# Patient Record
Sex: Female | Born: 1935 | Race: White | Hispanic: No | Marital: Single | Smoking: Never smoker
Health system: Southern US, Community
[De-identification: ages and names within clinical notes are randomized; demographics above are authoritative.]

## PROBLEM LIST (undated history)

## (undated) DIAGNOSIS — E7800 Pure hypercholesterolemia, unspecified: Secondary | ICD-10-CM

## (undated) DIAGNOSIS — D751 Secondary polycythemia: Secondary | ICD-10-CM

## (undated) DIAGNOSIS — E039 Hypothyroidism, unspecified: Secondary | ICD-10-CM

## (undated) DIAGNOSIS — I10 Essential (primary) hypertension: Secondary | ICD-10-CM

## (undated) DIAGNOSIS — J449 Chronic obstructive pulmonary disease, unspecified: Secondary | ICD-10-CM

## (undated) HISTORY — PX: BACK SURGERY: SHX140

## (undated) HISTORY — PX: THYROIDECTOMY: SHX17

---

## 2021-07-04 ENCOUNTER — Other Ambulatory Visit: Payer: Self-pay

## 2021-07-04 ENCOUNTER — Encounter (HOSPITAL_BASED_OUTPATIENT_CLINIC_OR_DEPARTMENT_OTHER): Payer: Self-pay

## 2021-07-04 ENCOUNTER — Emergency Department (HOSPITAL_BASED_OUTPATIENT_CLINIC_OR_DEPARTMENT_OTHER): Payer: Medicare Other

## 2021-07-04 ENCOUNTER — Observation Stay (HOSPITAL_BASED_OUTPATIENT_CLINIC_OR_DEPARTMENT_OTHER)
Admission: EM | Admit: 2021-07-04 | Discharge: 2021-07-05 | Disposition: A | Discharge status: Home / Self Care | Payer: Medicare Other | Attending: Internal Medicine | Admitting: Internal Medicine

## 2021-07-04 DIAGNOSIS — I129 Hypertensive chronic kidney disease with stage 1 through stage 4 chronic kidney disease, or unspecified chronic kidney disease: Secondary | ICD-10-CM | POA: Diagnosis not present

## 2021-07-04 DIAGNOSIS — J449 Chronic obstructive pulmonary disease, unspecified: Secondary | ICD-10-CM | POA: Diagnosis not present

## 2021-07-04 DIAGNOSIS — N183 Chronic kidney disease, stage 3 unspecified: Secondary | ICD-10-CM | POA: Insufficient documentation

## 2021-07-04 DIAGNOSIS — E039 Hypothyroidism, unspecified: Secondary | ICD-10-CM | POA: Diagnosis not present

## 2021-07-04 DIAGNOSIS — Z20822 Contact with and (suspected) exposure to covid-19: Secondary | ICD-10-CM | POA: Insufficient documentation

## 2021-07-04 DIAGNOSIS — R55 Syncope and collapse: Secondary | ICD-10-CM | POA: Diagnosis present

## 2021-07-04 DIAGNOSIS — R0600 Dyspnea, unspecified: Secondary | ICD-10-CM

## 2021-07-04 DIAGNOSIS — E890 Postprocedural hypothyroidism: Secondary | ICD-10-CM | POA: Diagnosis present

## 2021-07-04 DIAGNOSIS — I10 Essential (primary) hypertension: Secondary | ICD-10-CM

## 2021-07-04 HISTORY — DX: Hypothyroidism, unspecified: E03.9

## 2021-07-04 HISTORY — DX: Chronic obstructive pulmonary disease, unspecified: J44.9

## 2021-07-04 HISTORY — DX: Secondary polycythemia: D75.1

## 2021-07-04 HISTORY — DX: Essential (primary) hypertension: I10

## 2021-07-04 HISTORY — DX: Pure hypercholesterolemia, unspecified: E78.00

## 2021-07-04 LAB — CBC WITH DIFFERENTIAL/PLATELET
Abs Immature Granulocytes: 0.05 10*3/uL (ref 0.00–0.07)
Basophils Absolute: 0 10*3/uL (ref 0.0–0.1)
Basophils Relative: 0 %
Eosinophils Absolute: 0 10*3/uL (ref 0.0–0.5)
Eosinophils Relative: 0 %
HCT: 48.9 % — ABNORMAL HIGH (ref 36.0–46.0)
Hemoglobin: 16.9 g/dL — ABNORMAL HIGH (ref 12.0–15.0)
Immature Granulocytes: 1 %
Lymphocytes Relative: 5 %
Lymphs Abs: 0.5 10*3/uL — ABNORMAL LOW (ref 0.7–4.0)
MCH: 31.5 pg (ref 26.0–34.0)
MCHC: 34.6 g/dL (ref 30.0–36.0)
MCV: 91.2 fL (ref 80.0–100.0)
Monocytes Absolute: 0.9 10*3/uL (ref 0.1–1.0)
Monocytes Relative: 9 %
Neutro Abs: 8.4 10*3/uL — ABNORMAL HIGH (ref 1.7–7.7)
Neutrophils Relative %: 85 %
Platelets: 220 10*3/uL (ref 150–400)
RBC: 5.36 MIL/uL — ABNORMAL HIGH (ref 3.87–5.11)
RDW: 13.3 % (ref 11.5–15.5)
WBC: 9.9 10*3/uL (ref 4.0–10.5)
nRBC: 0 % (ref 0.0–0.2)

## 2021-07-04 LAB — BASIC METABOLIC PANEL
Anion gap: 11 (ref 5–15)
BUN: 20 mg/dL (ref 8–23)
CO2: 26 mmol/L (ref 22–32)
Calcium: 9.7 mg/dL (ref 8.9–10.3)
Chloride: 93 mmol/L — ABNORMAL LOW (ref 98–111)
Creatinine, Ser: 1.08 mg/dL — ABNORMAL HIGH (ref 0.44–1.00)
GFR, Estimated: 49 mL/min — ABNORMAL LOW (ref >60)
Glucose, Bld: 104 mg/dL — ABNORMAL HIGH (ref 70–99)
Potassium: 4.2 mmol/L (ref 3.5–5.1)
Sodium: 130 mmol/L — ABNORMAL LOW (ref 135–145)

## 2021-07-04 LAB — D-DIMER, QUANTITATIVE: D-Dimer, Quant: 0.8 ug/mL-FEU — ABNORMAL HIGH (ref 0.00–0.50)

## 2021-07-04 LAB — TROPONIN I (HIGH SENSITIVITY)
Troponin I (High Sensitivity): 3 ng/L (ref <18)
Troponin I (High Sensitivity): 4 ng/L (ref <18)

## 2021-07-04 LAB — RESP PANEL BY RT-PCR (FLU A&B, COVID) ARPGX2
Influenza A by PCR: NEGATIVE
Influenza B by PCR: NEGATIVE
SARS Coronavirus 2 by RT PCR: NEGATIVE

## 2021-07-04 MED ORDER — ACETAMINOPHEN 325 MG PO TABS: 650.0000 mg | ORAL_TABLET | Freq: Four times a day (QID) | ORAL | Status: DC | PRN

## 2021-07-04 MED ORDER — LISINOPRIL 10 MG PO TABS
10.0000 mg | ORAL_TABLET | Freq: Two times a day (BID) | ORAL | Status: DC
  Administered 2021-07-04 – 2021-07-05 (×2): 10 mg via ORAL
  Filled 2021-07-04 (×2): qty 1

## 2021-07-04 MED ORDER — HYDRALAZINE HCL 25 MG PO TABS: 25.0000 mg | ORAL_TABLET | Freq: Four times a day (QID) | ORAL | Status: DC | PRN

## 2021-07-04 MED ORDER — ACETAMINOPHEN 650 MG RE SUPP: 650.0000 mg | Freq: Four times a day (QID) | RECTAL | Status: DC | PRN

## 2021-07-04 MED ORDER — SODIUM CHLORIDE 0.9 % IV BOLUS
500.0000 mL | Freq: Once | INTRAVENOUS | Status: AC
  Administered 2021-07-04: 500 mL via INTRAVENOUS

## 2021-07-04 MED ORDER — ENOXAPARIN SODIUM 40 MG/0.4ML IJ SOSY
40.0000 mg | PREFILLED_SYRINGE | Freq: Every day | INTRAMUSCULAR | Status: DC
  Administered 2021-07-05: 40 mg via SUBCUTANEOUS
  Filled 2021-07-04: qty 0.4

## 2021-07-04 MED ORDER — LEVOTHYROXINE SODIUM 50 MCG PO TABS
50.0000 ug | ORAL_TABLET | Freq: Every day | ORAL | Status: DC
  Administered 2021-07-05: 50 ug via ORAL
  Filled 2021-07-04: qty 1

## 2021-07-04 NOTE — H&P (Addendum)
History and Physical    Marilyn Hobbs [id number] DOB: 09-23-1929 DOA: 07/04/2021  PCP: Pcp, No  Patient coming from: Home.  Chief Complaint: Loss of consciousness.  HPI: Marilyn Hobbs is a 87 y.o. female with history of hypertension, COPD, hyperlipidemia, postoperative hypothyroidism, polycythemia who has just recently moved from [place] about a month ago was brought to the ER because patient had a brief episode of syncope.  Patient states she has been having off-and-on dizzy spells today she was in the house with her grandson when she suddenly had a spell of loss of consciousness witnessed by patient's grandson.  Patient's grandson was able to hold her and place her on the floor.  Patient's syncopal episode lasted for few seconds.  Patient regained consciousness and has not had any focal deficits chest pain or incontinence of urine.  Patient states she has been having labile hypertension and takes lisinopril based on how her systolic blood pressure is.  Her dose of lisinopril varies from '40mg'$  to 10 mg twice daily based on her systolic blood pressure readings in the morning.  Patient also takes hydrochlorothiazide.  This morning patient took 40 mg of lisinopril and 25 mg of hydrochlorothiazide and shortly later patient developed the symptoms.  Over the last few days patient has been having shortness of breath.  ED Course: In the ER chest x-ray was unremarkable EKG shows normal sinus rhythm with QTC of 434 ms.  Labs show creatinine of 1.08 sodium 130 hemoglobin of 16 hematocrit of 48 high sensitive troponins were negative.  COVID test was negative.  Patient CT head was unremarkable.  Patient admitted for syncopal work-up.  Review of Systems: As per HPI, rest all negative.   Past Medical History:  Diagnosis Date   COPD (chronic obstructive pulmonary disease) (HCC)    High cholesterol    Hypertension    Hypothyroidism    Polycythemia     Past Surgical History:  Procedure Laterality Date    BACK SURGERY       reports that she has never smoked. She has never used smokeless tobacco. She reports current alcohol use of about 1.0 standard drink per week. No history on file for drug use.  No Known Allergies  Family History  Problem Relation Age of Onset   Breast cancer Mother     Prior to Admission medications   Not on File    Physical Exam: Constitutional: Moderately built and nourished. Vitals:   07/04/21 1645 07/04/21 1812 07/04/21 1951 07/04/21 2117  BP: (!) 169/91 (!) 172/77 (!) 179/73 (!) 147/81  Pulse: 70 68 75   Resp: '19 20 16   '$ Temp:   98.2 F (36.8 C) 98 F (36.7 C)  TempSrc:   Oral Oral  SpO2: 99% 100% 100%   Weight:      Height:       Eyes: Anicteric no pallor. ENMT: No discharge from the ears eyes nose and mouth. Neck: No mass felt.  No neck rigidity. Respiratory: No rhonchi or crepitations. Cardiovascular: S1-S2 heard. Abdomen: Soft nontender bowel sound present. Musculoskeletal: No edema. Skin: No rash. Neurologic: Alert awake oriented to time place and person.  Moves all extremities. Psychiatric: Appears normal.  Normal affect.   Labs on Admission: I have personally reviewed following labs and imaging studies  CBC: Recent Labs  Lab 07/04/21 1310  WBC 9.9  NEUTROABS 8.4*  HGB 16.9*  HCT 48.9*  MCV 91.2  PLT [id number]   Basic Metabolic Panel: Recent Labs  Lab 07/04/21  1310  NA 130*  K 4.2  CL 93*  CO2 26  GLUCOSE 104*  BUN 20  CREATININE 1.08*  CALCIUM 9.7   GFR: Estimated Creatinine Clearance: 33.8 mL/min (A) (by C-G formula based on SCr of 1.08 mg/dL (H)). Liver Function Tests: No results for input(s): AST, ALT, ALKPHOS, BILITOT, PROT, ALBUMIN in the last 168 hours. No results for input(s): LIPASE, AMYLASE in the last 168 hours. No results for input(s): AMMONIA in the last 168 hours. Coagulation Profile: No results for input(s): INR, PROTIME in the last 168 hours. Cardiac Enzymes: No results for input(s): CKTOTAL, CKMB,  CKMBINDEX, TROPONINI in the last 168 hours. BNP (last 3 results) No results for input(s): PROBNP in the last 8760 hours. HbA1C: No results for input(s): HGBA1C in the last 72 hours. CBG: No results for input(s): GLUCAP in the last 168 hours. Lipid Profile: No results for input(s): CHOL, HDL, LDLCALC, TRIG, CHOLHDL, LDLDIRECT in the last 72 hours. Thyroid Function Tests: No results for input(s): TSH, T4TOTAL, FREET4, T3FREE, THYROIDAB in the last 72 hours. Anemia Panel: No results for input(s): VITAMINB12, FOLATE, FERRITIN, TIBC, IRON, RETICCTPCT in the last 72 hours. Urine analysis: No results found for: COLORURINE, APPEARANCEUR, LABSPEC, PHURINE, GLUCOSEU, HGBUR, BILIRUBINUR, KETONESUR, PROTEINUR, UROBILINOGEN, NITRITE, LEUKOCYTESUR Sepsis Labs: '@LABRCNTIP'$ (procalcitonin:4,lacticidven:4) ) Recent Results (from the past 240 hour(s))  Resp Panel by RT-PCR (Flu A&B, Covid) Nasopharyngeal Swab     Status: None   Collection Time: 07/04/21  2:41 PM   Specimen: Nasopharyngeal Swab; Nasopharyngeal(NP) swabs in vial transport medium  Result Value Ref Range Status   SARS Coronavirus 2 by RT PCR NEGATIVE NEGATIVE Final    Comment: (NOTE) SARS-CoV-2 target nucleic acids are NOT DETECTED.  The SARS-CoV-2 RNA is generally detectable in upper respiratory specimens during the acute phase of infection. The lowest concentration of SARS-CoV-2 viral copies this assay can detect is 138 copies/mL. A negative result does not preclude SARS-Cov-2 infection and should not be used as the sole basis for treatment or other patient management decisions. A negative result may occur with  improper specimen collection/handling, submission of specimen other than nasopharyngeal swab, presence of viral mutation(s) within the areas targeted by this assay, and inadequate number of viral copies(<138 copies/mL). A negative result must be combined with clinical observations, patient history, and  epidemiological information. The expected result is Negative.  Fact Sheet for Patients:  EntrepreneurPulse.com.au  Fact Sheet for Healthcare Providers:  IncredibleEmployment.be  This test is no t yet approved or cleared by the [place] FDA and  has been authorized for detection and/or diagnosis of SARS-CoV-2 by FDA under an Emergency Use Authorization (EUA). This EUA will remain  in effect (meaning this test can be used) for the duration of the COVID-19 declaration under Section 564(b)(1) of the Act, 21 U.S.C.section 360bbb-3(b)(1), unless the authorization is terminated  or revoked sooner.       Influenza A by PCR NEGATIVE NEGATIVE Final   Influenza B by PCR NEGATIVE NEGATIVE Final    Comment: (NOTE) The Xpert Xpress SARS-CoV-2/FLU/RSV plus assay is intended as an aid in the diagnosis of influenza from Nasopharyngeal swab specimens and should not be used as a sole basis for treatment. Nasal washings and aspirates are unacceptable for Xpert Xpress SARS-CoV-2/FLU/RSV testing.  Fact Sheet for Patients: EntrepreneurPulse.com.au  Fact Sheet for Healthcare Providers: IncredibleEmployment.be  This test is not yet approved or cleared by the [place] FDA and has been authorized for detection and/or diagnosis of SARS-CoV-2 by FDA under an Emergency  Use Authorization (EUA). This EUA will remain in effect (meaning this test can be used) for the duration of the COVID-19 declaration under Section 564(b)(1) of the Act, 21 U.S.C. section 360bbb-3(b)(1), unless the authorization is terminated or revoked.  Performed at [facility], [address] [place], [place] [address]      Radiological Exams on Admission: CT HEAD WO CONTRAST (5MM)  Result Date: 07/04/2021 CLINICAL DATA:  Syncope EXAM: CT HEAD WITHOUT CONTRAST TECHNIQUE: Contiguous axial images were obtained from the base of the skull  through the vertex without intravenous contrast. COMPARISON:  None. FINDINGS: Brain: No evidence of acute infarction, hemorrhage, hydrocephalus, extra-axial collection or mass lesion/mass effect. Periventricular white matter changes, likely the sequela of chronic small vessel ischemic disease. Vascular: No hyperdense vessel or unexpected calcification. Skull: Evaluation is somewhat limited by motion affecting primarily the face and skull base. Within this limitation, negative for fracture or focal lesion. Sinuses/Orbits: Evaluation is somewhat limited by motion. Within limitation, no acute finding. Other: None. IMPRESSION: No acute intracranial process. Electronically Signed   By: Merilyn Baba M.D.   On: 07/04/2021 15:44   DG Chest Port 1 View  Result Date: 07/04/2021 CLINICAL DATA:  Dyspnea. EXAM: PORTABLE CHEST 1 VIEW COMPARISON:  None. FINDINGS: The heart size and mediastinal contours are within normal limits. Both lungs are clear. The visualized skeletal structures are unremarkable. IMPRESSION: No active disease. Electronically Signed   By: Marijo Conception M.D.   On: 07/04/2021 14:09    EKG: Independently reviewed.  Normal sinus rhythm QTc 434 ms.  Assessment/Plan Principal Problem:   Syncope and collapse Active Problems:   Essential hypertension   CKD (chronic kidney disease), stage III (HCC)   Post-operative hypothyroidism   Syncope    Syncope -we will closely monitor in telemetry check orthostatics.  Since patient has been having some shortness of breath we will check D-dimer and also 2D echo. Hypertension -patient takes various dose of lisinopril.  For now I have placed patient on 10 mg twice daily which is the lowest dose patient usually takes.  As needed IV hydralazine for systolic more than 0000000.  Hold hydrochlorothiazide for now until we get orthostatics and also patient has hyponatremia.  Closely monitor blood pressure trends. Hyponatremia with sodium of 130.  Follow metabolic panel  hold hydrochlorothiazide for now.  We do not have any old labs to compare. History of postoperative hypothyroidism on Synthroid. History of COPD takes Spiriva. History of polycythemia vera gets phlebotomy based on hematocrit. Chronically disease stage III we do not have old labs to compare.  Follow metabolic panel.  I was able to verify some of the home medications.  And have ordered accordingly.  Some of the other medicines which patient takes has to be reconsulted.   Will need to get her records from [place].   DVT prophylaxis: Lovenox. Code Status: Full code. Family Communication: Patient's daughter. Disposition Plan: Home. Consults called: None. Admission status: Observation.   Rise Patience MD [facility] Pager [phone].  If 7PM-7AM, please contact night-coverage [email or web address] Password [facility]  07/04/2021, 11:53 PM

## 2021-07-04 NOTE — ED Triage Notes (Signed)
Pt states she has been feeling light headed and SOB-states she passed out ~1030am-to triage via w/c

## 2021-07-04 NOTE — Plan of Care (Signed)
  Problem: Education: Goal: Knowledge of General Education information will improve Description: Including pain rating scale, medication(s)/side effects and non-pharmacologic comfort measures Outcome: Progressing   Problem: Health Behavior/Discharge Planning: Goal: Ability to manage health-related needs will improve Outcome: Progressing   Problem: Clinical Measurements: Goal: Ability to maintain clinical measurements within normal limits will improve Outcome: Progressing   Problem: Clinical Measurements: Goal: Will remain free from infection Outcome: Progressing   Problem: Clinical Measurements: Goal: Diagnostic test results will improve Outcome: Progressing   Problem: Clinical Measurements: Goal: Respiratory complications will improve Outcome: Progressing   Problem: Clinical Measurements: Goal: Cardiovascular complication will be avoided Outcome: Progressing   Problem: Safety: Goal: Ability to remain free from injury will improve Outcome: Progressing   Problem: Skin Integrity: Goal: Risk for impaired skin integrity will decrease Outcome: Progressing

## 2021-07-04 NOTE — ED Provider Notes (Signed)
[facility] EMERGENCY DEPARTMENT Provider Note   CSN: [id number] Arrival date & time: 07/04/21  1153     History Chief Complaint  Patient presents with   Loss of Consciousness    Marilyn Hobbs is a 87 y.o. female.  HPI 87 year old female presents with syncope.  History is from patient and grandson.  Patient has been feeling lightheaded since taking her meds around 10:30 AM.  This includes lisinopril and hydralazine.  She also has been short of breath.  She was then at the sink and was otherwise doing well when her son noticed she started to get wobbly and started to fall backwards.  He caught her and lowered her to the ground.  She was confused but not unresponsive during this time.  This lasted about 2 minutes and then now she is back to normal and does not remember what happened.  She still feels a little lightheaded and short of breath.  Further talking to her, she indicates that she is lightheaded from time to time as well as short of breath.  She states her chest is a little tight but does not really hurt.  Otherwise she has not been feeling sick recently including no fevers, cough, diarrhea.  No recent medication changes.  Past Medical History:  Diagnosis Date   High cholesterol    Hypertension     There are no problems to display for this patient.   History reviewed. No pertinent surgical history.   OB History   No obstetric history on file.     No family history on file.  Social History   Tobacco Use   Smoking status: Never   Smokeless tobacco: Never  Substance Use Topics   Alcohol use: Yes    Comment: weekly    Home Medications Prior to Admission medications   Not on File    Allergies    Patient has no allergy information on record.  Review of Systems   Review of Systems  Constitutional:  Negative for fever.  Respiratory:  Positive for chest tightness and shortness of breath. Negative for cough.   Cardiovascular:  Negative for chest pain and  leg swelling.  Gastrointestinal:  Negative for abdominal pain, diarrhea and vomiting.  Neurological:  Positive for syncope and light-headedness. Negative for headaches.  All other systems reviewed and are negative.  Physical Exam Updated Vital Signs There were no vitals taken for this visit.  Physical Exam Vitals and nursing note reviewed.  Constitutional:      Appearance: She is well-developed.  HENT:     Head: Normocephalic and atraumatic.     Right Ear: External ear normal.     Left Ear: External ear normal.     Nose: Nose normal.  Eyes:     General:        Right eye: No discharge.        Left eye: No discharge.     Extraocular Movements: Extraocular movements intact.     Pupils: Pupils are equal, round, and reactive to light.  Cardiovascular:     Rate and Rhythm: Normal rate and regular rhythm.     Heart sounds: Normal heart sounds.  Pulmonary:     Effort: Pulmonary effort is normal.     Breath sounds: Normal breath sounds.  Abdominal:     Palpations: Abdomen is soft.     Tenderness: There is no abdominal tenderness.  Skin:    General: Skin is warm and dry.  Neurological:  Mental Status: She is alert.     Comments: CN 3-12 grossly intact. 5/5 strength in all 4 extremities. Grossly normal sensation. Normal finger to nose.   Psychiatric:        Mood and Affect: Mood is not anxious.    ED Results / Procedures / Treatments   Labs (all labs ordered are listed, but only abnormal results are displayed) Labs Reviewed - No data to display  EKG EKG Interpretation  Date/Time:  Wednesday July 04 2021 12:13:26 EDT Ventricular Rate:  89 PR Interval:  144 QRS Duration: 88 QT Interval:  356 QTC Calculation: 434 R Axis:   87 Text Interpretation: Sinus rhythm Atrial premature complex Consider right atrial enlargement Borderline right axis deviation No old tracing to compare Confirmed by Sherwood Gambler [phone]) on 07/04/2021 12:36:49 PM  Radiology No results  found.  Procedures Procedures   Medications Ordered in ED Medications  sodium chloride 0.9 % bolus 500 mL (has no administration in time range)    ED Course  I have reviewed the triage vital signs and the nursing notes.  Pertinent labs & imaging results that were available during my care of the patient were reviewed by me and considered in my medical decision making (see chart for details).    MDM Rules/Calculators/A&P                           Initial workup is unremarkable. Likely this was syncope. When discussing more with grandson, he denies seizure like activity but does state she was stiff initially. Will get head CT. Either way she should get admitted for higher risk syncope vs seizure. Care to Dr. Sherry Ruffing.  Final Clinical Impression(s) / ED Diagnoses Final diagnoses:  None    Rx / DC Orders ED Discharge Orders     None        Sherwood Gambler, MD 07/04/21 1554

## 2021-07-04 NOTE — Progress Notes (Signed)
Received a phone call from Facility: [facility]  Requesting MD: dr. Sherry Ruffing Patient with h/o HTN, HLD presenting with syncope and collapse. Son caught her. Has had some near syncopal episodes in the past. No shaking. Bp has been labile.   Vitals stable, on room air. Sodium: 130, hgb: 16.9, d-dimer was .21 which was age adjusted. No CTA. Given a little fluids.  CXR and CTH wnl.   Plan of care: syncope w/u, echo, telemetry and BP adjustments.   The patient will be accepted for admission to telemetry at [facility] when bed is available.  Requested the transferring physician give pm dose or half of her pm dose of hydralazine.    Nursing staff, Please call the [facility] number at the top of Amion at the time of the patient's arrival so that the patient can be paged to the admitting physician.   Casimer Bilis M.D. [facility]

## 2021-07-04 NOTE — ED Notes (Signed)
Per PT grandson Merrilee Seashore), requested that we call him and convey where PT will be transferred to. I called and LMM that PT will be transferred to [facility].

## 2021-07-04 NOTE — ED Notes (Signed)
Attempted to call report to [place]. Left message to call back.

## 2021-07-04 NOTE — ED Provider Notes (Signed)
3:21 PM Care assumed from Dr. Regenia Skeeter.  At time of transfer of care, patient is awaiting for results of CT head prior to admission for concerning syncope in a 87 year old female with a reported history of unknown cardiac disease, hypertension, and hypercholesterolemia.  Patient had a D-dimer that was elevated however per age adjustment, it was normal.  Based on discussion with previous team, we will not pursue CT PE study at this time.  Patient is awaiting CT head to rule out intracranial injury from the syncope and fall to the ground.  Patient's labs revealed some hyponatremia and likely hemoconcentration.  She was given Fluids initially.  Anticipate admission after imaging is completed.  5:16 PM Patient CT head shows no acute intracranial abnormality or bleed.  I went and reassessed the patient and she was concerned about the syncopal episode today and the occasional lightheaded spells that hit her on and off.  She says that she had taken her blood pressure medications today and shortly thereafter is when she had this episode of syncope.  She does say that her blood pressures also are very elevated at times and they are in the 160s and 170s in the emergency department.  Patient received a small enough fluids as her blood work did suggest some dehydration with possible hemoconcentration.  Creatinine also slightly elevated at 1.08.  She is denying any new chest pain, palpitations, and is denying any constant shortness of breath.  She reports chronic intermittent shortness of breath but this does not feel different.  COVID and flu are negative.  Troponin was negative.  Given the syncopal episode and is 87 year old with concern for waxing and waning blood pressures, the previous team was very concerned about the safety of sending her home.  Will call for admission for likely blood pressure medication adjustment and monitoring for syncope and recurrent near syncope.   Clinical Impression: 1. Syncope,  unspecified syncope type   2. Dyspnea     Disposition: Admit  This note was prepared with assistance of Dragon voice recognition software. Occasional wrong-word or sound-a-like substitutions may have occurred due to the inherent limitations of voice recognition software.     Ronia Hazelett, Gwenyth Allegra, MD 07/05/21 [phone]

## 2021-07-05 ENCOUNTER — Observation Stay (HOSPITAL_BASED_OUTPATIENT_CLINIC_OR_DEPARTMENT_OTHER): Payer: Medicare Other

## 2021-07-05 DIAGNOSIS — R55 Syncope and collapse: Secondary | ICD-10-CM

## 2021-07-05 DIAGNOSIS — N1831 Chronic kidney disease, stage 3a: Secondary | ICD-10-CM

## 2021-07-05 LAB — ECHOCARDIOGRAM COMPLETE
AV Mean grad: 2 mmHg
AV Peak grad: 3.7 mmHg
Ao pk vel: 0.97 m/s
Area-P 1/2: 3.12 cm2
Height: 64 in
Single Plane A4C EF: 60.3 %
Weight: 2350.4 oz

## 2021-07-05 LAB — CBC
HCT: 43.9 % (ref 36.0–46.0)
Hemoglobin: 14.9 g/dL (ref 12.0–15.0)
MCH: 31.1 pg (ref 26.0–34.0)
MCHC: 33.9 g/dL (ref 30.0–36.0)
MCV: 91.6 fL (ref 80.0–100.0)
Platelets: 196 10*3/uL (ref 150–400)
RBC: 4.79 MIL/uL (ref 3.87–5.11)
RDW: 13.4 % (ref 11.5–15.5)
WBC: 6.2 10*3/uL (ref 4.0–10.5)
nRBC: 0 % (ref 0.0–0.2)

## 2021-07-05 LAB — BASIC METABOLIC PANEL
Anion gap: 10 (ref 5–15)
BUN: 13 mg/dL (ref 8–23)
CO2: 27 mmol/L (ref 22–32)
Calcium: 9.6 mg/dL (ref 8.9–10.3)
Chloride: 99 mmol/L (ref 98–111)
Creatinine, Ser: 0.99 mg/dL (ref 0.44–1.00)
GFR, Estimated: 54 mL/min — ABNORMAL LOW (ref >60)
Glucose, Bld: 86 mg/dL (ref 70–99)
Potassium: 4.2 mmol/L (ref 3.5–5.1)
Sodium: 136 mmol/L (ref 135–145)

## 2021-07-05 LAB — D-DIMER, QUANTITATIVE: D-Dimer, Quant: 0.73 ug/mL-FEU — ABNORMAL HIGH (ref 0.00–0.50)

## 2021-07-05 LAB — CREATININE, SERUM
Creatinine, Ser: 1.06 mg/dL — ABNORMAL HIGH (ref 0.44–1.00)
GFR, Estimated: 50 mL/min — ABNORMAL LOW (ref >60)

## 2021-07-05 MED ORDER — LISINOPRIL 10 MG PO TABS: 10.0000 mg | ORAL_TABLET | Freq: Two times a day (BID) | ORAL | 0 refills | Status: DC

## 2021-07-05 MED ORDER — ALBUTEROL SULFATE (2.5 MG/3ML) 0.083% IN NEBU: 2.5000 mg | INHALATION_SOLUTION | RESPIRATORY_TRACT | Status: DC | PRN

## 2021-07-05 NOTE — Discharge Summary (Signed)
Physician Discharge Summary  Mariska Stennett [id number] DOB: [birth year] DOA: 07/04/2021  PCP: Merryl Hacker, No  Admit date: 07/04/2021 Discharge date: 07/05/2021  Admitted From:Home Disposition:  Home   Recommendations for Outpatient Follow-up:  Follow up with PCP in 1-2 weeks Please obtain BMP/CBC in one week  Home Health:YES Equipment/Devices:Rollator  Discharge Condition:Stable CODE STATUS:FULL Diet recommendation: Heart Healthy   Brief/Interim Summary:   Syncope  -Most likely in the setting of orthostatic, blood pressure medications, CT head with no acute findings, she was monitored on telemetry with no evidence of malignant arrhythmias, bradycardia, or heart block, he was not orthostatic, she ambulated with PT, she did well, so recommendation is to discontinue hydrochlorothiazide, and to decrease her lisinopril dose, and home health has been arranged for her as well, she will need Rollator which has been arranged as well. -2D echo with no acute findings.   Hypertension  - patient takes various dose of lisinopril.  For now I have placed patient on 10 mg twice daily which is the lowest dose patient usually takes.   -  Hold hydrochlorothiazide   Hyponatremia with sodium of 130.  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  History of COPD takes Spiriva.  History of polycythemia vera gets phlebotomy based on hematocrit.  Chronically disease stage III we do not have old labs to compare.  Follow metabolic panel.    Discharge Diagnoses:  Principal Problem:   Syncope and collapse Active Problems:   Essential hypertension   CKD (chronic kidney disease), stage III (HCC)   Post-operative hypothyroidism   Syncope    Discharge Instructions  Discharge Instructions     Diet - low sodium heart healthy   Complete by: As directed    Increase activity slowly   Complete by: As directed       Allergies as of 07/05/2021    No Known Allergies      Medication List     TAKE these medications    ALPRAZolam 0.25 MG tablet Commonly known as: XANAX Take 0.25 mg by mouth daily as needed for anxiety.   aspirin EC 81 MG tablet Take 81 mg by mouth daily. Swallow whole.   carboxymethylcellulose 0.5 % Soln Commonly known as: REFRESH PLUS Place 1 drop into both eyes 3 (three) times daily as needed.   febuxostat 40 MG tablet Commonly known as: ULORIC Take 40 mg by mouth daily.   levothyroxine 25 MCG tablet Commonly known as: SYNTHROID Take 25 mcg by mouth daily before breakfast.   lisinopril 10 MG tablet Commonly known as: ZESTRIL Take 1 tablet (10 mg total) by mouth 2 (two) times daily. What changed:  medication strength how much to take   multivitamin capsule Take 1 capsule by mouth daily.   rosuvastatin 10 MG tablet Commonly known as: CRESTOR Take 10 mg by mouth daily.               Durable Medical Equipment  (From admission, onward)           Start     Ordered   07/05/21 1004  For home use only DME 4 wheeled rolling walker with seat  Once       Question:  Patient needs a walker to treat with the following condition  Answer:  Weakness   07/05/21 [facility] Follow up.   Why: rollator Contact information:  [address] [place] [address] [phone]         Janith Lima, MD Follow up on 07/12/2021.   Specialty: Internal Medicine Contact information: [place] [address] [phone]         [facility], [facility] Follow up.   Specialty: Home Health Services Why: HHPT Contact information: [place] [address] [phone]                No Known Allergies  Consultations: None  Procedures/Studies: CT HEAD WO CONTRAST (5MM)  Result Date: 07/04/2021 CLINICAL DATA:  Syncope EXAM: CT HEAD WITHOUT CONTRAST TECHNIQUE: Contiguous axial  images were obtained from the base of the skull through the vertex without intravenous contrast. COMPARISON:  None. FINDINGS: Brain: No evidence of acute infarction, hemorrhage, hydrocephalus, extra-axial collection or mass lesion/mass effect. Periventricular white matter changes, likely the sequela of chronic small vessel ischemic disease. Vascular: No hyperdense vessel or unexpected calcification. Skull: Evaluation is somewhat limited by motion affecting primarily the face and skull base. Within this limitation, negative for fracture or focal lesion. Sinuses/Orbits: Evaluation is somewhat limited by motion. Within limitation, no acute finding. Other: None. IMPRESSION: No acute intracranial process. Electronically Signed   By: Merilyn Baba M.D.   On: 07/04/2021 15:44   DG Chest Port 1 View  Result Date: 07/04/2021 CLINICAL DATA:  Dyspnea. EXAM: PORTABLE CHEST 1 VIEW COMPARISON:  None. FINDINGS: The heart size and mediastinal contours are within normal limits. Both lungs are clear. The visualized skeletal structures are unremarkable. IMPRESSION: No active disease. Electronically Signed   By: Marijo Conception M.D.   On: 07/04/2021 14:09   ECHOCARDIOGRAM COMPLETE  Result Date: 07/05/2021    ECHOCARDIOGRAM REPORT   Patient Name:   MINTA CANA Date of Exam: 07/05/2021 Medical Rec #:  [id number]  Height:       64.0 in Accession #:    [id number] Weight:       146.9 lb Date of Birth:  02-08-1929   BSA:          1.716 m Patient Age:    87 years   BP:           142/84 mmHg Patient Gender: F          HR:           68 bpm. Exam Location:  Inpatient Procedure: 2D Echo, Cardiac Doppler and Color Doppler Indications:    Syncope  History:        Patient has no prior history of Echocardiogram examinations.                 COPD; Risk Factors:Dyslipidemia and Hypertension.  Sonographer:    Luisa Hart RDCS Referring Phys: [place]  Sonographer Comments: No parasternal window. Image acquisition challenging due to  patient body habitus, Image acquisition challenging due to respiratory motion and Image acquisition challenging due to COPD. IMPRESSIONS  1. Left ventricular ejection fraction, by estimation, is 60 to 65%. The left ventricle has normal function. The left ventricle has no regional wall motion abnormalities. There is mild left ventricular hypertrophy. Left ventricular diastolic parameters are indeterminate.  2. Right ventricular systolic function is normal. The right ventricular size is normal. There is normal pulmonary artery systolic pressure.  3. The mitral valve is grossly normal. Trivial mitral valve regurgitation.  4. The aortic valve was not well visualized. Aortic valve regurgitation is not visualized. No aortic stenosis is  present.  5. The inferior vena cava is normal in size with greater than 50% respiratory variability, suggesting right atrial pressure of 3 mmHg. FINDINGS  Left Ventricle: Left ventricular ejection fraction, by estimation, is 60 to 65%. The left ventricle has normal function. The left ventricle has no regional wall motion abnormalities. The left ventricular internal cavity size was normal in size. There is  mild left ventricular hypertrophy. Left ventricular diastolic parameters are indeterminate. Right Ventricle: The right ventricular size is normal. No increase in right ventricular wall thickness. Right ventricular systolic function is normal. There is normal pulmonary artery systolic pressure. The tricuspid regurgitant velocity is 1.50 m/s, and  with an assumed right atrial pressure of 3 mmHg, the estimated right ventricular systolic pressure is [id number]. Left Atrium: Left atrial size was not well visualized. Right Atrium: Right atrial size was normal in size. Pericardium: There is no evidence of pericardial effusion. Mitral Valve: The mitral valve is grossly normal. Trivial mitral valve regurgitation. Tricuspid Valve: The tricuspid valve is normal in structure. Tricuspid valve  regurgitation is trivial. Aortic Valve: The aortic valve was not well visualized. Aortic valve regurgitation is not visualized. No aortic stenosis is present. Aortic valve mean gradient measures 2.0 mmHg. Aortic valve peak gradient measures 3.7 mmHg. Pulmonic Valve: The pulmonic valve was not well visualized. Pulmonic valve regurgitation is not visualized. Aorta: The aortic root was not well visualized. Venous: The inferior vena cava is normal in size with greater than 50% respiratory variability, suggesting right atrial pressure of 3 mmHg. IAS/Shunts: The interatrial septum was not well visualized.   LV Volumes (MOD) LV vol d, MOD A4C: 23.5 ml Diastology LV vol s, MOD A4C: 9.3 ml  LV e' medial:    7.07 cm/s LV SV MOD A4C:     23.5 ml LV E/e' medial:  10.5                            LV e' lateral:   7.94 cm/s                            LV E/e' lateral: 9.4  RIGHT VENTRICLE RV Basal diam:  2.70 cm RV Mid diam:    1.70 cm TAPSE (M-mode): 1.3 cm RIGHT ATRIUM          Index RA Area:     7.25 cm RA Volume:   11.50 ml 6.70 ml/m  AORTIC VALVE AV Vmax:           96.50 cm/s AV Vmean:          66.700 cm/s AV VTI:            0.220 m AV Peak Grad:      3.7 mmHg AV Mean Grad:      2.0 mmHg LVOT Vmax:         80.20 cm/s LVOT Vmean:        51.900 cm/s LVOT VTI:          0.163 m LVOT/AV VTI ratio: 0.74 MITRAL VALVE                TRICUSPID VALVE MV Area (PHT): 3.12 cm     TR Peak grad:   9.0 mmHg MV Decel Time: 243 msec     TR Vmax:        150.00 cm/s MV E velocity: 74.40 cm/s MV A velocity: 104.00 cm/s  SHUNTS MV E/A ratio:  0.72         Systemic VTI: 0.16 m Oswaldo Milian MD Electronically signed by Oswaldo Milian MD Signature Date/Time: 07/05/2021/10:51:49 AM    Final       Subjective:  No dizziness, no lightheadedness, he denies any complaints,she  ambulated with PT with symptoms. Discharge Exam: Vitals:   07/05/21 0700 07/05/21 0906  BP: (!) 143/77 (!) 156/79  Pulse: 74   Resp: 17   Temp: 97.9 F  (36.6 C)   SpO2:     Vitals:   07/05/21 0100 07/05/21 0452 07/05/21 0700 07/05/21 0906  BP:  (!) 142/84 (!) 143/77 (!) 156/79  Pulse:  65 74   Resp:  19 17   Temp:  98.1 F (36.7 C) 97.9 F (36.6 C)   TempSrc:  Oral Oral   SpO2:  97%    Weight: 67 kg 66.6 kg    Height: '5\' 4"'$  (1.626 m)       General: Pt is alert, awake, not in acute distress Cardiovascular: RRR, S1/S2 +, no rubs, no gallops Respiratory: CTA bilaterally, no wheezing, no rhonchi Abdominal: Soft, NT, ND, bowel sounds + Extremities: no edema, no cyanosis    The results of significant diagnostics from this hospitalization (including imaging, microbiology, ancillary and laboratory) are listed below for reference.     Microbiology: Recent Results (from the past 240 hour(s))  Resp Panel by RT-PCR (Flu A&B, Covid) Nasopharyngeal Swab     Status: None   Collection Time: 07/04/21  2:41 PM   Specimen: Nasopharyngeal Swab; Nasopharyngeal(NP) swabs in vial transport medium  Result Value Ref Range Status   SARS Coronavirus 2 by RT PCR NEGATIVE NEGATIVE Final    Comment: (NOTE) SARS-CoV-2 target nucleic acids are NOT DETECTED.  The SARS-CoV-2 RNA is generally detectable in upper respiratory specimens during the acute phase of infection. The lowest concentration of SARS-CoV-2 viral copies this assay can detect is 138 copies/mL. A negative result does not preclude SARS-Cov-2 infection and should not be used as the sole basis for treatment or other patient management decisions. A negative result may occur with  improper specimen collection/handling, submission of specimen other than nasopharyngeal swab, presence of viral mutation(s) within the areas targeted by this assay, and inadequate number of viral copies(<138 copies/mL). A negative result must be combined with clinical observations, patient history, and epidemiological information. The expected result is Negative.  Fact Sheet for Patients:   EntrepreneurPulse.com.au  Fact Sheet for Healthcare Providers:  IncredibleEmployment.be  This test is no t yet approved or cleared by the [place] FDA and  has been authorized for detection and/or diagnosis of SARS-CoV-2 by FDA under an Emergency Use Authorization (EUA). This EUA will remain  in effect (meaning this test can be used) for the duration of the COVID-19 declaration under Section 564(b)(1) of the Act, 21 U.S.C.section 360bbb-3(b)(1), unless the authorization is terminated  or revoked sooner.       Influenza A by PCR NEGATIVE NEGATIVE Final   Influenza B by PCR NEGATIVE NEGATIVE Final    Comment: (NOTE) The Xpert Xpress SARS-CoV-2/FLU/RSV plus assay is intended as an aid in the diagnosis of influenza from Nasopharyngeal swab specimens and should not be used as a sole basis for treatment. Nasal washings and aspirates are unacceptable for Xpert Xpress SARS-CoV-2/FLU/RSV testing.  Fact Sheet for Patients: EntrepreneurPulse.com.au  Fact Sheet for Healthcare Providers: IncredibleEmployment.be  This test is not yet approved or cleared by the [place] FDA and has been  authorized for detection and/or diagnosis of SARS-CoV-2 by FDA under an Emergency Use Authorization (EUA). This EUA will remain in effect (meaning this test can be used) for the duration of the COVID-19 declaration under Section 564(b)(1) of the Act, 21 U.S.C. section 360bbb-3(b)(1), unless the authorization is terminated or revoked.  Performed at [facility], [address] [place], [place] [address]      Labs: BNP (last 3 results) No results for input(s): BNP in the last 8760 hours. Basic Metabolic Panel: Recent Labs  Lab 07/04/21 1310 07/05/21 0328  NA 130* 136  K 4.2 4.2  CL 93* 99  CO2 26 27  GLUCOSE 104* 86  BUN 20 13  CREATININE 1.08* 0.99  1.06*  CALCIUM 9.7 9.6   Liver Function  Tests: No results for input(s): AST, ALT, ALKPHOS, BILITOT, PROT, ALBUMIN in the last 168 hours. No results for input(s): LIPASE, AMYLASE in the last 168 hours. No results for input(s): AMMONIA in the last 168 hours. CBC: Recent Labs  Lab 07/04/21 1310 07/05/21 0328  WBC 9.9 6.2  NEUTROABS 8.4*  --   HGB 16.9* 14.9  HCT 48.9* 43.9  MCV 91.2 91.6  PLT 220 196   Cardiac Enzymes: No results for input(s): CKTOTAL, CKMB, CKMBINDEX, TROPONINI in the last 168 hours. BNP: Invalid input(s): POCBNP CBG: No results for input(s): GLUCAP in the last 168 hours. D-Dimer Recent Labs    07/04/21 1310 07/05/21 0328  DDIMER 0.80* 0.73*   Hgb A1c No results for input(s): HGBA1C in the last 72 hours. Lipid Profile No results for input(s): CHOL, HDL, LDLCALC, TRIG, CHOLHDL, LDLDIRECT in the last 72 hours. Thyroid function studies No results for input(s): TSH, T4TOTAL, T3FREE, THYROIDAB in the last 72 hours.  Invalid input(s): FREET3 Anemia work up No results for input(s): VITAMINB12, FOLATE, FERRITIN, TIBC, IRON, RETICCTPCT in the last 72 hours. Urinalysis No results found for: COLORURINE, APPEARANCEUR, [place], [place], GLUCOSEU, [place], [place], [place], PROTEINUR, UROBILINOGEN, NITRITE, LEUKOCYTESUR Sepsis Labs Invalid input(s): PROCALCITONIN,  WBC,  LACTICIDVEN Microbiology Recent Results (from the past 240 hour(s))  Resp Panel by RT-PCR (Flu A&B, Covid) Nasopharyngeal Swab     Status: None   Collection Time: 07/04/21  2:41 PM   Specimen: Nasopharyngeal Swab; Nasopharyngeal(NP) swabs in vial transport medium  Result Value Ref Range Status   SARS Coronavirus 2 by RT PCR NEGATIVE NEGATIVE Final    Comment: (NOTE) SARS-CoV-2 target nucleic acids are NOT DETECTED.  The SARS-CoV-2 RNA is generally detectable in upper respiratory specimens during the acute phase of infection. The lowest concentration of SARS-CoV-2 viral copies this assay can detect is 138 copies/mL. A negative  result does not preclude SARS-Cov-2 infection and should not be used as the sole basis for treatment or other patient management decisions. A negative result may occur with  improper specimen collection/handling, submission of specimen other than nasopharyngeal swab, presence of viral mutation(s) within the areas targeted by this assay, and inadequate number of viral copies(<138 copies/mL). A negative result must be combined with clinical observations, patient history, and epidemiological information. The expected result is Negative.  Fact Sheet for Patients:  EntrepreneurPulse.com.au  Fact Sheet for Healthcare Providers:  IncredibleEmployment.be  This test is no t yet approved or cleared by the [place] FDA and  has been authorized for detection and/or diagnosis of SARS-CoV-2 by FDA under an Emergency Use Authorization (EUA). This EUA will remain  in effect (meaning this test can be used) for the duration of the COVID-19 declaration  under Section 564(b)(1) of the Act, 21 U.S.C.section 360bbb-3(b)(1), unless the authorization is terminated  or revoked sooner.       Influenza A by PCR NEGATIVE NEGATIVE Final   Influenza B by PCR NEGATIVE NEGATIVE Final    Comment: (NOTE) The Xpert Xpress SARS-CoV-2/FLU/RSV plus assay is intended as an aid in the diagnosis of influenza from Nasopharyngeal swab specimens and should not be used as a sole basis for treatment. Nasal washings and aspirates are unacceptable for Xpert Xpress SARS-CoV-2/FLU/RSV testing.  Fact Sheet for Patients: EntrepreneurPulse.com.au  Fact Sheet for Healthcare Providers: IncredibleEmployment.be  This test is not yet approved or cleared by the [place] FDA and has been authorized for detection and/or diagnosis of SARS-CoV-2 by FDA under an Emergency Use Authorization (EUA). This EUA will remain in effect (meaning this test can be used)  for the duration of the COVID-19 declaration under Section 564(b)(1) of the Act, 21 U.S.C. section 360bbb-3(b)(1), unless the authorization is terminated or revoked.  Performed at [facility], [address] [place], [place] [address]      Time coordinating discharge: Over 30 minutes  SIGNED:   Phillips Climes, MD  [facility] 07/05/2021, 2:36 PM Pager   If 7PM-7AM, please contact night-coverage [email or web address]

## 2021-07-05 NOTE — Discharge Instructions (Signed)
Follow with Primary MD in 7 days  ° °Get CBC, CMP,  checked  by Primary MD next visit.  ° ° °Activity: As tolerated with Full fall precautions use walker/cane & assistance as needed ° ° °Disposition Home  ° ° °Diet: Heart Healthy  ° ° °On your next visit with your primary care physician please Get Medicines reviewed and adjusted. ° ° °Please request your Prim.MD to go over all Hospital Tests and Procedure/Radiological results at the follow up, please get all Hospital records sent to your Prim MD by signing hospital release before you go home. ° ° °If you experience worsening of your admission symptoms, develop shortness of breath, life threatening emergency, suicidal or homicidal thoughts you must seek medical attention immediately by calling 911 or calling your MD immediately  if symptoms less severe. ° °You Must read complete instructions/literature along with all the possible adverse reactions/side effects for all the Medicines you take and that have been prescribed to you. Take any new Medicines after you have completely understood and accpet all the possible adverse reactions/side effects.  ° °Do not drive, operating heavy machinery, perform activities at heights, swimming or participation in water activities or provide baby sitting services if your were admitted for syncope or siezures until you have seen by Primary MD or a Neurologist and advised to do so again. ° °Do not drive when taking Pain medications.  ° ° °Do not take more than prescribed Pain, Sleep and Anxiety Medications ° °Special Instructions: If you have smoked or chewed Tobacco  in the last 2 yrs please stop smoking, stop any regular Alcohol  and or any Recreational drug use. ° °Wear Seat belts while driving. ° ° °Please note ° °You were cared for by a hospitalist during your hospital stay. If you have any questions about your discharge medications or the care you received while you were in the hospital after you are discharged, you can call the  unit and asked to speak with the hospitalist on call if the hospitalist that took care of you is not available. Once you are discharged, your primary care physician will handle any further medical issues. Please note that NO REFILLS for any discharge medications will be authorized once you are discharged, as it is imperative that you return to your primary care physician (or establish a relationship with a primary care physician if you do not have one) for your aftercare needs so that they can reassess your need for medications and monitor your lab values. ° °

## 2021-07-05 NOTE — Evaluation (Signed)
Physical Therapy Evaluation Patient Details Name: Marilyn Hobbs MRN: [id number] DOB: 1929/02/19 Today's Date: 07/05/2021   History of Present Illness  87yo female admitted 07/04/21 after LOC at home. Admitted for syncopal workup. PMH COPD, HLD, HTN, back surgery  Clinical Impression  Patient received in bed, pleasant and cooperative although a bit HOH. Generally able to mobilize on a min guard basis with SPC, but mildly unsteady and a bit of a furniture surfer- tells me she wants to get a rollator, which is appropriate. VSS on RA. Left up in recliner with all needs met, RN aware of patient status. Will benefit from HHPT f/u and use of rollator moving forward.     Follow Up Recommendations Home health PT    Equipment Recommendations  Other (comment) (rollator)    Recommendations for Other Services       Precautions / Restrictions Precautions Precautions: Fall Restrictions Weight Bearing Restrictions: No      Mobility  Bed Mobility Overal bed mobility: Needs Assistance Bed Mobility: Supine to Sit     Supine to sit: Min guard;HOB elevated     General bed mobility comments: increased time and effort, assist for line management    Transfers Overall transfer level: Needs assistance Equipment used: Straight cane Transfers: Sit to/from Stand Sit to Stand: Min guard         General transfer comment: min guard, increased time to steady once up but no external assist given besides line management  Ambulation/Gait Ambulation/Gait assistance: Min guard Gait Distance (Feet): 120 Feet Assistive device: Straight cane Gait Pattern/deviations: Step-through pattern;Decreased step length - right;Decreased step length - left;Trunk flexed;Drifts right/left Gait velocity: decreased   General Gait Details: mildly unsteady with SPC, also with reduced step/stride length and very mild L/R drift; VSS on RA but light headed during gait. Tends to reach out for surrounding furniture when  available  Stairs            Wheelchair Mobility    Modified Rankin (Stroke Patients Only)       Balance Overall balance assessment: Mild deficits observed, not formally tested                                           Pertinent Vitals/Pain Pain Assessment: No/denies pain    Home Living Family/patient expects to be discharged to:: Private residence Living Arrangements: Children;Other (Comment) (daughter and grandson) Available Help at Discharge: Family;Available PRN/intermittently Type of Home: House Home Access: Level entry     Home Layout: Two level;Able to live on main level with bedroom/bathroom Home Equipment: Shower seat;Toilet riser;Cane - single point;Walker - 2 wheels Additional Comments: would like a rollator; no falls besides syncopal episode, no trips/stumbles    Prior Function Level of Independence: Independent with assistive device(s)         Comments: not driving, manages bills/money and medicines     Hand Dominance        Extremity/Trunk Assessment   Upper Extremity Assessment Upper Extremity Assessment: Generalized weakness    Lower Extremity Assessment Lower Extremity Assessment: Generalized weakness    Cervical / Trunk Assessment Cervical / Trunk Assessment: Kyphotic  Communication   Communication: No difficulties  Cognition Arousal/Alertness: Awake/alert Behavior During Therapy: WFL for tasks assessed/performed Overall Cognitive Status: Within Functional Limits for tasks assessed  General Comments: HOH so sometimes gave inappropriate response just from not hearing question, but very pleasant and cooperative      General Comments General comments (skin integrity, edema, etc.): VSS on RA    Exercises     Assessment/Plan    PT Assessment Patient needs continued PT services  PT Problem List Decreased strength;Decreased balance;Decreased mobility;Decreased  knowledge of use of DME;Decreased safety awareness       PT Treatment Interventions DME instruction;Balance training;Gait training;Functional mobility training;Patient/family education;Therapeutic activities;Therapeutic exercise    PT Goals (Current goals can be found in the Care Plan section)  Acute Rehab PT Goals Patient Stated Goal: get a rollator, go home today PT Goal Formulation: With patient Time For Goal Achievement: 07/19/21 Potential to Achieve Goals: Good    Frequency Min 3X/week   Barriers to discharge        Co-evaluation               AM-PAC PT "6 Clicks" Mobility  Outcome Measure Help needed turning from your back to your side while in a flat bed without using bedrails?: None Help needed moving from lying on your back to sitting on the side of a flat bed without using bedrails?: A Little Help needed moving to and from a bed to a chair (including a wheelchair)?: A Little Help needed standing up from a chair using your arms (e.g., wheelchair or bedside chair)?: A Little Help needed to walk in hospital room?: A Little Help needed climbing 3-5 steps with a railing? : A Lot 6 Click Score: 18    End of Session Equipment Utilized During Treatment: Gait belt Activity Tolerance: Patient tolerated treatment well Patient left: in chair;with call bell/phone within reach Nurse Communication: Mobility status PT Visit Diagnosis: Unsteadiness on feet (R26.81);Muscle weakness (generalized) (M62.81)    Time: 1282-0813 PT Time Calculation (min) (ACUTE ONLY): 27 min   Charges:   PT Evaluation $PT Eval Low Complexity: 1 Low PT Treatments $Gait Training: 8-22 mins       Windell Norfolk, DPT, PN2   Supplemental Physical Therapist [place]    Pager [phone] Acute Rehab Office [phone]

## 2021-07-05 NOTE — Progress Notes (Signed)
*  PRELIMINARY RESULTS* Echocardiogram 2D Echocardiogram has been performed.  Luisa Hart RDCS 07/05/2021, 8:18 AM

## 2021-07-05 NOTE — TOC Transition Note (Signed)
Transition of Care [facility]) - CM/SW Discharge Note   Patient Details  Name: Ashelynn Vanbramer MRN: [id number] Date of Birth: [birth year]  Transition of Care [facility] CM/SW Contact:  Zenon Mayo, RN Phone Number: 07/05/2021, 10:29 AM   Clinical Narrative:    NCM spoke with patient, offered choice for HHPT, she has no preference.  She also would like a rollator.  NCM made referral to [facility] with [employer] for the rollolator, if they do not have any on site they will ship one to her home. NCM also made referral to [facility] with [facility] for [place].  He is able to take referral , soc will begin 24 to 48 hrs post dc.  She will have transport home at dc.    Final next level of care: [place] Barriers to Discharge: No Barriers Identified   Patient Goals and CMS Choice Patient states their goals for this hospitalization and ongoing recovery are:: return home CMS Medicare.gov Compare Post Acute Care list provided to:: Patient Choice offered to / list presented to : Patient  Discharge Placement                       Discharge Plan and Services                DME Arranged: Walker rolling with seat DME Agency: [employer] Date DME Agency Contacted: 07/05/21 Time DME Agency Contacted: [id number] Representative spoke with at DME Agency: [place]: PT [place]: [place] Date [place]: 07/05/21 Time [place]: [id number] Representative spoke with at [place]: [place]) Interventions     Readmission Risk Interventions No flowsheet data found.

## 2021-07-05 NOTE — Progress Notes (Signed)
Eval complete, documentation pending, will benefit from HHPT and rollator moving forward.  Windell Norfolk, DPT, PN2   Supplemental Physical Therapist [place]    Pager [phone] Acute Rehab Office [phone]

## 2021-07-09 ENCOUNTER — Telehealth: Payer: Self-pay

## 2021-07-09 NOTE — Telephone Encounter (Signed)
[place] [phone]  Patient will be new patient to Dr. Ronnald Ramp on Thursday 8.25.22 Moved here from [place], been here a for a month  Clair Gulling evaluated the patient today following her release from the hospital   Vitals:  Heart rate in 90s Blood pressure prior to meds-200/110 while sitting 180/100 standing History of syncope  BP after taking meds: 160/90 sitting 150/84 standing  Pt states she has chronic SOB  95% oxygen or greater on room air  Possibly dehydrated, family states her urine is quite odorous  VERBAL ORDERS REQUESTED:  Physical Therapy  2 times/week, one week 1 Time for week four weeks  Occupational therapy needed for bathing and dressing  Speech Therpay needed for for swallowing, patient states she has intermittant chocking  Nursing requested  for disease and medication management

## 2021-07-12 ENCOUNTER — Encounter: Payer: Self-pay | Admitting: Internal Medicine

## 2021-07-12 ENCOUNTER — Other Ambulatory Visit: Payer: Self-pay

## 2021-07-12 ENCOUNTER — Ambulatory Visit (INDEPENDENT_AMBULATORY_CARE_PROVIDER_SITE_OTHER): Payer: Medicare Other | Admitting: Internal Medicine

## 2021-07-12 VITALS — BP 148/76 | HR 89 | Temp 97.7°F | Resp 16 | Ht 64.0 in | Wt 145.0 lb

## 2021-07-12 DIAGNOSIS — E890 Postprocedural hypothyroidism: Secondary | ICD-10-CM | POA: Insufficient documentation

## 2021-07-12 DIAGNOSIS — D45 Polycythemia vera: Secondary | ICD-10-CM | POA: Diagnosis not present

## 2021-07-12 DIAGNOSIS — I10 Essential (primary) hypertension: Secondary | ICD-10-CM | POA: Diagnosis not present

## 2021-07-12 NOTE — Progress Notes (Signed)
Subjective:  Patient ID: Marilyn Hobbs, female    DOB: December 09, 1928  Age: 87 y.o. MRN: [id number]  CC: Hypertension and Hypothyroidism  This visit occurred during the SARS-CoV-2 public health emergency.  Safety protocols were in place, including screening questions prior to the visit, additional usage of staff PPE, and extensive cleaning of exam room while observing appropriate contact time as indicated for disinfecting solutions.    HPI Marilyn Hobbs presents for f/up and to establish.  She recently moved here from [place].  She has a history of polycythemia vera.  She would like to establish with a local hematologist.  She was recently seen in the ED after a syncopal episode.  It was felt that this was related to elevated blood pressure.  She has felt better since discharge.  She complains of chronic, unchanged shortness of breath.  She has a mild, nonproductive, intermittent cough that she does not want to stop taking the ACE inhibitor.  She had her thyroid gland removed a few years ago.  History Faiza has a past medical history of COPD (chronic obstructive pulmonary disease) ([place]), High cholesterol, Hypertension, Hypothyroidism, and Polycythemia.   She has a past surgical history that includes Back surgery and Thyroidectomy.   Her family history includes Breast cancer in her mother.She reports that she has never smoked. She has never used smokeless tobacco. She reports current alcohol use of about 1.0 standard drink per week. She reports that she does not use drugs.  Outpatient Medications Prior to Visit  Medication Sig Dispense Refill   ALPRAZolam (XANAX) 0.25 MG tablet Take 0.25 mg by mouth daily as needed for anxiety.     aspirin EC 81 MG tablet Take 81 mg by mouth daily. Swallow whole.     carboxymethylcellulose (REFRESH PLUS) 0.5 % SOLN Place 1 drop into both eyes 3 (three) times daily as needed.     febuxostat (ULORIC) 40 MG tablet Take 40 mg by mouth daily.     lisinopril (ZESTRIL) 10 MG  tablet Take 1 tablet (10 mg total) by mouth 2 (two) times daily. 60 tablet 0   Multiple Vitamin (MULTIVITAMIN) capsule Take 1 capsule by mouth daily.     rosuvastatin (CRESTOR) 10 MG tablet Take 10 mg by mouth daily.     levothyroxine (SYNTHROID) 25 MCG tablet Take 25 mcg by mouth daily before breakfast.     No facility-administered medications prior to visit.    ROS Review of Systems  Constitutional:  Negative for chills, diaphoresis, fatigue and fever.  HENT: Negative.    Respiratory:  Positive for cough and shortness of breath. Negative for chest tightness and wheezing.   Cardiovascular:  Negative for chest pain, palpitations and leg swelling.  Gastrointestinal:  Negative for abdominal pain, constipation, diarrhea, nausea and vomiting.  Genitourinary: Negative.  Negative for difficulty urinating.  Musculoskeletal: Negative.  Negative for arthralgias.  Skin: Negative.   Neurological:  Positive for weakness. Negative for dizziness, light-headedness and headaches.  Hematological:  Negative for adenopathy. Does not bruise/bleed easily.  Psychiatric/Behavioral: Negative.     Objective:  BP (!) 148/76 (BP Location: Left Arm, Patient Position: Sitting, Cuff Size: Large)   Pulse 89   Temp 97.7 F (36.5 C) (Oral)   Resp 16   Ht '5\' 4"'$  (1.626 m)   Wt 145 lb (65.8 kg)   SpO2 98%   BMI 24.89 kg/m   Physical Exam Vitals reviewed.  Constitutional:      Appearance: She is not ill-appearing.  HENT:  Nose: Nose normal.     Mouth/Throat:     Mouth: Mucous membranes are moist.  Eyes:     Conjunctiva/sclera: Conjunctivae normal.  Cardiovascular:     Rate and Rhythm: Normal rate and regular rhythm.     Heart sounds: No murmur heard. Pulmonary:     Effort: Pulmonary effort is normal.     Breath sounds: No stridor. No wheezing, rhonchi or rales.  Abdominal:     General: Abdomen is flat.     Palpations: There is no mass.     Tenderness: There is no abdominal tenderness. There is no  guarding.     Hernia: No hernia is present.  Musculoskeletal:        General: Normal range of motion.     Cervical back: Neck supple.     Right lower leg: No edema.     Left lower leg: No edema.  Lymphadenopathy:     Cervical: No cervical adenopathy.  Skin:    General: Skin is warm and dry.  Neurological:     General: No focal deficit present.  Psychiatric:        Attention and Perception: Attention normal.        Mood and Affect: Mood is anxious.    Lab Results  Component Value Date   WBC 6.2 07/05/2021   HGB 14.9 07/05/2021   HCT 43.9 07/05/2021   PLT 196 07/05/2021   GLUCOSE 86 07/05/2021   NA 136 07/05/2021   K 4.2 07/05/2021   CL 99 07/05/2021   CREATININE 1.06 (H) 07/05/2021   CREATININE 0.99 07/05/2021   BUN 13 07/05/2021   CO2 27 07/05/2021   TSH 7.47 (H) 07/12/2021     Assessment & Plan:   Afsheen was seen today for hypertension and hypothyroidism.  Diagnoses and all orders for this visit:  Postoperative hypothyroidism- Her TSH is in the acceptable range.  Will continue the current dose of T4. -     TSH; Future -     TSH -     levothyroxine (SYNTHROID) 25 MCG tablet; Take 1 tablet (25 mcg total) by mouth daily before breakfast.  Polycythemia vera ([place])- Her recent H&H were normal. -     Ambulatory referral to Hematology / Oncology  Essential hypertension- Her blood pressure is adequately well controlled.  I encouraged her not to be too concerned about variations in her blood pressure.  We will continue the current dose of hydralazine and the ACE inhibitor. -     hydrALAZINE (APRESOLINE) 10 MG tablet; Take 1 tablet (10 mg total) by mouth 3 (three) times daily.  I have changed [facility] levothyroxine. I am also having her start on hydrALAZINE. Additionally, I am having her maintain her rosuvastatin, ALPRAZolam, febuxostat, aspirin EC, multivitamin, carboxymethylcellulose, and lisinopril.  Meds ordered this encounter  Medications   hydrALAZINE  (APRESOLINE) 10 MG tablet    Sig: Take 1 tablet (10 mg total) by mouth 3 (three) times daily.    Dispense:  270 tablet    Refill:  0   levothyroxine (SYNTHROID) 25 MCG tablet    Sig: Take 1 tablet (25 mcg total) by mouth daily before breakfast.    Dispense:  90 tablet    Refill:  0      Follow-up: No follow-ups on file.  Scarlette Calico, MD

## 2021-07-13 ENCOUNTER — Telehealth: Payer: Self-pay | Admitting: Internal Medicine

## 2021-07-13 LAB — TSH: TSH: 7.47 u[IU]/mL — ABNORMAL HIGH (ref 0.35–5.50)

## 2021-07-13 MED ORDER — LEVOTHYROXINE SODIUM 25 MCG PO TABS: 25.0000 ug | ORAL_TABLET | Freq: Every day | ORAL | 0 refills | Status: DC

## 2021-07-13 MED ORDER — HYDRALAZINE HCL 10 MG PO TABS: 10.0000 mg | ORAL_TABLET | Freq: Three times a day (TID) | ORAL | 0 refills | Status: DC

## 2021-07-13 NOTE — Telephone Encounter (Signed)
Patient declined OT evaluation this week  Requesting verbal okay to set appt for next week  Please follow up [phone]

## 2021-07-13 NOTE — Telephone Encounter (Signed)
Verbal given to move visit to next week

## 2021-07-16 ENCOUNTER — Other Ambulatory Visit: Payer: Self-pay | Admitting: Internal Medicine

## 2021-07-16 ENCOUNTER — Telehealth: Payer: Self-pay | Admitting: *Deleted

## 2021-07-16 ENCOUNTER — Telehealth: Payer: Self-pay | Admitting: Internal Medicine

## 2021-07-16 DIAGNOSIS — I10 Essential (primary) hypertension: Secondary | ICD-10-CM

## 2021-07-16 MED ORDER — INDAPAMIDE 1.25 MG PO TABS: 1.2500 mg | ORAL_TABLET | Freq: Every day | ORAL | 0 refills | Status: DC

## 2021-07-16 NOTE — Telephone Encounter (Signed)
Per referral Dr. Ronnald Ramp - called and gave upcoming appointments - confirmed and mailed calendar

## 2021-07-16 NOTE — Telephone Encounter (Signed)
---  Caller states she has a BP of 193/106, takes lisinopril and hydroxizine at night.  Patient advised to go to ED, Caller understood, but undecided   Attempted to call the patient to see if she needed an appointment  Unable to reach the patient

## 2021-07-17 ENCOUNTER — Telehealth: Payer: Self-pay

## 2021-07-17 NOTE — Telephone Encounter (Signed)
Pt has been informed. She stated that her BP has stabilized so she would like to wait a few more days before trying the new medication.

## 2021-07-17 NOTE — Telephone Encounter (Signed)
Pt states her BP is high and her HCG is High and is wondering if that is causing her BP to run high. Pt states she can not get in to see a Hematologist until October and would like advice on what are her next steps to take for the above matter.

## 2021-07-24 ENCOUNTER — Telehealth: Payer: Self-pay | Admitting: Internal Medicine

## 2021-07-24 NOTE — Telephone Encounter (Signed)
Marilyn Hobbs  [phone] [place]   BP 160/90 No symptoms  Patient reports chronic sob, oxygen reading remains stable  Patient is concerned about blood values taken at the hospital, what is her hemoglobin level currently  Nurse will go out to do an eval If would like a blood draw, can take an order for that  [phone] Fax for the order  Daughter reports that patient's urine is odorous, memory not as good as before, possible UTI?  Patient reports her anxiety makes it difficult for her to function as well as normal  Patient not consistent with taking meds as scheduled, she is anxious about blood pressure  Patient takes the medication below not as prescribed in hospital, only once in a while- febuxostat (ULORIC) 40 MG tablet   Requesting verbal orders for medical social worker for community resources, struggling with adjustment moving [place] new [place]

## 2021-07-25 NOTE — Telephone Encounter (Signed)
Verbal order given to Clair Gulling for social worker  Pt has been informed she would need an OV to discuss the issues of her BP, anxiety, and possible UTI. She stated that she would talk with her daughter to see if she could be brought tomorrow for an appointment since she no longer drives. Pt would call back to schedule.

## 2021-07-26 ENCOUNTER — Other Ambulatory Visit: Payer: Self-pay

## 2021-07-27 ENCOUNTER — Ambulatory Visit (INDEPENDENT_AMBULATORY_CARE_PROVIDER_SITE_OTHER): Payer: Medicare Other | Admitting: Internal Medicine

## 2021-07-27 ENCOUNTER — Encounter: Payer: Self-pay | Admitting: Internal Medicine

## 2021-07-27 DIAGNOSIS — I10 Essential (primary) hypertension: Secondary | ICD-10-CM

## 2021-07-27 DIAGNOSIS — D45 Polycythemia vera: Secondary | ICD-10-CM

## 2021-07-27 MED ORDER — LISINOPRIL-HYDROCHLOROTHIAZIDE 20-12.5 MG PO TABS: 1.0000 | ORAL_TABLET | Freq: Every day | ORAL | 0 refills | Status: DC

## 2021-07-27 NOTE — Progress Notes (Signed)
   Subjective:   Patient ID: Marilyn Hobbs, female    DOB: Apr 15, 1929, 87 y.o.   MRN: [id number]  HPI The patient is a 87 YO female coming in for several concerns. She has recently moved to the area. Established here and some BP issues since visit. Indapamide was sent in for her to start and she has not gotten this as the pharmacy did not have it. She then stopped taking lisinopril as she was thinking she would just take hydralazine which she states she is taking TID but daughter with her is not sure about compliance. She is checking BP often and the high readings are causing some anxiety.    Review of Systems  Constitutional: Negative.   HENT: Negative.    Eyes: Negative.   Respiratory:  Negative for cough, chest tightness and shortness of breath.   Cardiovascular:  Negative for chest pain, palpitations and leg swelling.  Gastrointestinal:  Negative for abdominal distention, abdominal pain, constipation, diarrhea, nausea and vomiting.  Musculoskeletal: Negative.   Skin: Negative.   Neurological: Negative.   Psychiatric/Behavioral:  The patient is nervous/anxious.    Objective:  Physical Exam Constitutional:      Appearance: She is well-developed.  HENT:     Head: Normocephalic and atraumatic.  Cardiovascular:     Rate and Rhythm: Normal rate and regular rhythm.  Pulmonary:     Effort: Pulmonary effort is normal. No respiratory distress.     Breath sounds: Normal breath sounds. No wheezing or rales.  Abdominal:     General: Bowel sounds are normal. There is no distension.     Palpations: Abdomen is soft.     Tenderness: There is no abdominal tenderness. There is no rebound.  Musculoskeletal:     Cervical back: Normal range of motion.  Skin:    General: Skin is warm and dry.  Neurological:     Mental Status: She is alert and oriented to person, place, and time.     Coordination: Coordination normal.    Vitals:   07/27/21 1546  BP: (!) 160/78  Pulse: (!) 101  Resp: 18  SpO2:  99%  Weight: 148 lb 3.2 oz (67.2 kg)  Height: '5\' 4"'$  (1.626 m)    This visit occurred during the SARS-CoV-2 public health emergency.  Safety protocols were in place, including screening questions prior to the visit, additional usage of staff PPE, and extensive cleaning of exam room while observing appropriate contact time as indicated for disinfecting solutions.   Assessment & Plan:

## 2021-07-27 NOTE — Assessment & Plan Note (Signed)
Rx lisinopril/hctz 20/12.5 mg daily to help with BP and simplify regimen. She is advised not to fill indapamide and stop taking hydralazine (as she is not taking this TID likely consistently). Come back in 1-2 weeks for BP and labs to see how medication is doing.

## 2021-07-27 NOTE — Patient Instructions (Addendum)
We have sent in lisinopril/hctz which is a combination pill to take once a day.  Stop taking your current lisinopril and hydralazine and if you have indapamide do not take it lately.   Pepcid or tums are over the counter for acid which might help your stomach.  Try to drink some more fluids.

## 2021-07-27 NOTE — Assessment & Plan Note (Signed)
She was unaware of recent labs which showed normal readings. This was reviewed with her daughter and herself.

## 2021-07-30 ENCOUNTER — Telehealth: Payer: Self-pay

## 2021-07-30 NOTE — Telephone Encounter (Signed)
Please advise as pt was seen on Friday 07/27/2021 by Dr. Sharlet Salina for elevated BP levels and was rx'd lisinopril-hydrochlorothiazide (ZESTORETIC) 20-12.5 MG tablet   Pts daughter has stated pt already has trouble with Dehydration and was wondering if taking this new BP medicaiton that has the water pill included would cause her to become even more dehydrated.  **Pts daughter is asking is pt suppose to be on two BP medications instead of the one? She also wants to know if the pt was suppose to be taking the Hydralazine 3x a day a/w another BP med? Pts daughter states pt was only taking the Hydralazine.    Pt daughter [name] can be reached at [phone].

## 2021-07-30 NOTE — Telephone Encounter (Signed)
Jim from [place] called   Stated patient's BP has been elevated. They have some questions regarding medication management. The family has not picked up the new medication that was added from her last visit on 07/27/2021.   Requesting a callback to clarify medications.   Last BP reading: 140/90 (at rest) and 160/90 (moderate movement)   Please advise.

## 2021-08-02 ENCOUNTER — Encounter: Payer: Self-pay | Admitting: Internal Medicine

## 2021-08-02 NOTE — Telephone Encounter (Signed)
Beth from [place] called   Reported patient's BP was 160/86 at her visit today. Patient has not been taking the lisinopril but has continued to take the hydralazine HCI. The family has not picked up the new medication from the [place] on 07/27/2021. Requesting clarification for the family on whether the patient should d/c the hydralazine and start the lisonprol-HCT.   Requesting verbals as well for skilled nursing, freq: 1x for 5wk  Please advise.   Best contact #: [phone]

## 2021-08-02 NOTE — Addendum Note (Signed)
Addended by: Hinda Kehr on: 08/02/2021 03:19 PM   Modules accepted: Orders

## 2021-08-02 NOTE — Telephone Encounter (Signed)
Telephone: [place]   BP sitting 180/100 End of the visit BP 140/90  Hydralazine three times daily  Family is unclear   Significant anxiety that can cause sob, blood pressure elevation, and mild OCD like behavior (per daughter)   PRN xanax on med list, but pt not aware of that medication  Reports 94 and above no matter what she is doing, however patient reports chronic sob, last night woke her up from sleeping  Clair Gulling still waiting to hear from the Provider

## 2021-08-02 NOTE — Telephone Encounter (Signed)
Verbal order given for skilled nursing. Marilyn Hobbs also informed me that the pt was out of her rosuvastatin and needed a refill. I have dropped the refill for sign off.  I informed her that I have already discussed medication concerns with pt's daughter, [name], in regard. See below and stated we are waiting for response from PCP.

## 2021-08-02 NOTE — Telephone Encounter (Signed)
Called Marilyn Hobbs.  LVM informing him that pt should take the lisinopril-htz as prescribed. Rx sent on 9/9 during OV with Dr. Sharlet Salina.  Called pt's daughter, [name], informed her of the above information.   Debbie stated prior to the 9/9 OV with Dr. Sharlet Salina the pt was suppose to be taking 2 medications (lisinopril and hydralazine) per OV with PCP on 8/25. However, the patient informed her daughter that she was in fact NOT taking both pills prior to the recent OV on 9/9. Pt's daughter wants to know is the new medication (lisinopril-htz) too high of a dose? Or will it be ok for her to take?   Please advise.

## 2021-08-03 ENCOUNTER — Telehealth: Payer: Self-pay | Admitting: Internal Medicine

## 2021-08-03 NOTE — Telephone Encounter (Signed)
Marilyn Hobbs called back reporting the BP was 197/101 this morning after taking the hydralazine. Patient retook her BP an hour later and it was 171/97. [name] has been unable to reach the family to be updated on the medication list and if they picked up the new medication.   Requesting a callback so she be clarify what medications the patient is taking and/or needing to take.

## 2021-08-03 NOTE — Telephone Encounter (Signed)
Marilyn Hobbs calling in to report patient BP reading  9:45am: 197/104  Patient reports taking hydrALAZINE (APRESOLINE) 10 MG tablet around 8:30 am

## 2021-08-04 ENCOUNTER — Other Ambulatory Visit: Payer: Self-pay | Admitting: Internal Medicine

## 2021-08-06 ENCOUNTER — Telehealth: Payer: Self-pay | Admitting: Internal Medicine

## 2021-08-06 MED ORDER — ROSUVASTATIN CALCIUM 10 MG PO TABS: 10.0000 mg | ORAL_TABLET | Freq: Every day | ORAL | 0 refills | Status: DC

## 2021-08-06 NOTE — Telephone Encounter (Signed)
Marilyn Hobbs called   Patient was having audial wheezing at her visit today. Complained of the SOB episodes as stated below. Patient has a spiriva inhaler but was unsure if she should use it due to possible reaction with other medications. Requesting a callback.   Best callback #: [phone]

## 2021-08-06 NOTE — Telephone Encounter (Signed)
BP 170/96  asymtomatic Took BP meds 45 min before arrival When left 140/90  Middle of night SOB w/o doing anything, lasting a few minutes to a few hours  Shooting pain through head for a few hours, patient states it is chronic, but no information provided on diagnosis  Patient is concerned about hemocrit levels that was abnormal in the hospital, wants to get tested asap, patient has a specialist visit in mid October, that will need to be rescheduled  due to her daughter's schedule Home health nurse can draw hemocrit labs with an order from the provider  Marilyn Hobbs [phone]

## 2021-08-06 NOTE — Telephone Encounter (Signed)
Per verbal conversation with PCP I informed him of no OV's available at the office today and asked if he wanted pt to be seen at [facility] or ED due to the reason of call which he agreed.    I informed Marilyn Maize [facility]mings) of the recommendations which she did not agree with stating "the symptoms are nothing new and is not acute" and also added "what is urgent care going to do?" I reiterated that was PCPs recommendations on how to proceed which she replied "I can tell you now the patient will not want to do that". I informed her that I would follow up with the pt and her daughter myself in regard. Beth stated the intention of her previous call was to get direction if the patient should use the Spiriva inhaler that she has and if it would pose any risk with her other medications.   Called pt's daughter, Marilyn Hobbs, to discuss PCPs recommendations.

## 2021-08-06 NOTE — Addendum Note (Signed)
Addended by: Hinda Kehr on: 08/06/2021 09:29 AM   Modules accepted: Orders

## 2021-08-06 NOTE — Telephone Encounter (Signed)
Called pt, LVM fpr ot's daughter.  Spoke to [place] and informed her that PCP stated the dose was correct. Reiterated that pt should no longer take hydralazine or lisinopril(plain). She should only be taking the lisinopril-htz. Beth expressed understanding.

## 2021-08-07 NOTE — Telephone Encounter (Signed)
Called Beth, LVM stating ok to use inhaler.

## 2021-08-08 ENCOUNTER — Telehealth: Payer: Self-pay | Admitting: *Deleted

## 2021-08-08 NOTE — Telephone Encounter (Signed)
Patient called to reschedule appointments due to transportation issues. Patient has been rescheduled and a new updated calendar mailed.

## 2021-08-13 ENCOUNTER — Telehealth: Payer: Self-pay

## 2021-08-13 NOTE — Telephone Encounter (Signed)
[name] has called and stated pt BP is elevated and has the following readings from today's visit with pt. Seated 160/110 Standing1 50/100 End of visit 150/100- Seated Only taking new BP med lisinopril-hydrochlorothiazide (ZESTORETIC) 20-12.5 MG tablet  Clair Gulling can be contacted at [phone], Ok to LVM.

## 2021-08-17 ENCOUNTER — Telehealth: Payer: Self-pay | Admitting: Internal Medicine

## 2021-08-17 NOTE — Telephone Encounter (Signed)
Marilyn Hobbs [phone]  Overall BP still elevated typically 140/170 on top over 90/110n on the bottom  138/82 on 9.29.22  Dizziness, worse at night, intermittent SOB, not associated with movement  Can Dr. Ronnald Ramp make a psych eval, patient has OCD and it affecting her functional ability Asking same questions over and over, with 50 pieces of paper

## 2021-08-20 ENCOUNTER — Other Ambulatory Visit: Payer: Self-pay | Admitting: Internal Medicine

## 2021-08-20 ENCOUNTER — Telehealth: Payer: Self-pay | Admitting: Internal Medicine

## 2021-08-20 DIAGNOSIS — I10 Essential (primary) hypertension: Secondary | ICD-10-CM

## 2021-08-20 MED ORDER — AMLODIPINE BESYLATE 5 MG PO TABS: 5.0000 mg | ORAL_TABLET | Freq: Every day | ORAL | 1 refills | Status: DC

## 2021-08-20 NOTE — Telephone Encounter (Signed)
LVM for Clair Gulling with details below.  Called pt's daughter, [name], LVM to discuss Rx addition.

## 2021-08-20 NOTE — Telephone Encounter (Signed)
Marilyn Hobbs [phone]  Physical Therapist saw Marilyn Hobbs today  BP sitting 180/110 Standing 170/100  Experiences head lightness  BP 170/98 at end of session  Chronic SOB, oxygen during visit 95-99%

## 2021-08-22 ENCOUNTER — Other Ambulatory Visit: Payer: Medicare Other

## 2021-08-22 ENCOUNTER — Ambulatory Visit: Payer: Medicare Other | Admitting: Family

## 2021-08-23 ENCOUNTER — Telehealth: Payer: Self-pay | Admitting: Internal Medicine

## 2021-08-23 NOTE — Telephone Encounter (Signed)
[employer]...  Caller states she is having shortness of breath and it started this morning when she got up after taking amlodopiine and has gotten worse as the day has went on no chest pain.   Advised to Call EMS 911 now. Patient understood but preferred to call the doctor.

## 2021-08-23 NOTE — Telephone Encounter (Signed)
Marilyn Hobbs from [place] called   Reporting at her visit today her BP reading was 140/170 and when patient re-checked it 100/110. Patient was seeing a pulmonologist when she was in [facility] but was wondering if she should establish care with someone down here. She was also using Spiriva inhaler but d/c months ago. Marilyn Hobbs would like to know if she should resume using the inhaler, if so can a new order be placed. Pt also expressed complaints of being light headed, SOB, and wheezing in her throat and chest region. Denies worsening with movement.   Best contact #: [phone]

## 2021-08-24 ENCOUNTER — Other Ambulatory Visit: Payer: Self-pay | Admitting: Internal Medicine

## 2021-08-24 DIAGNOSIS — J418 Mixed simple and mucopurulent chronic bronchitis: Secondary | ICD-10-CM

## 2021-08-24 MED ORDER — REVEFENACIN 175 MCG/3ML IN SOLN: 175.0000 ug | Freq: Every day | RESPIRATORY_TRACT | 1 refills | Status: DC

## 2021-08-24 NOTE — Telephone Encounter (Signed)
Called Marilyn Hobbs, LVM stating that neb solution was sent to pharmacy and that urgent referral to pulm was entered so pt can establish care locally.

## 2021-08-29 ENCOUNTER — Telehealth: Payer: Self-pay | Admitting: Internal Medicine

## 2021-08-29 NOTE — Telephone Encounter (Signed)
Marilyn Hobbs has been informed to proceed for another week.

## 2021-08-29 NOTE — Telephone Encounter (Signed)
Ashly from [place] calling, unable to contact patient.   Verified numbers in Epic w/Ashly, Ashly is concerned pt thinks it is a scam call. Please call pt

## 2021-08-29 NOTE — Telephone Encounter (Signed)
Beth from [place] has called and is requesting additional nursing verbal orders on pt. Her order has run out, and she wants to know if the provider can order at least 1 more week. States that after the 1 week, there would be a re-evaluation to determine if the pt. Needs re-certification.    Callback #- [phone]

## 2021-08-31 ENCOUNTER — Other Ambulatory Visit: Payer: Self-pay | Admitting: Internal Medicine

## 2021-09-06 MED ORDER — ALBUTEROL SULFATE HFA 108 (90 BASE) MCG/ACT IN AERS: 2.0000 | INHALATION_SPRAY | Freq: Four times a day (QID) | RESPIRATORY_TRACT | 8 refills | Status: DC | PRN

## 2021-09-06 NOTE — Telephone Encounter (Signed)
[facility]buterol inhaler sent to [facility]

## 2021-09-06 NOTE — Telephone Encounter (Signed)
I have spoke to [facility], she stated that the pt was under the impression that an albuterol inhaler would be Rx'd. She does not have a nebulizer machine to use the solution and has never used it before. Beth also mentioned that she is scheduled to see pulm on 11/4 but would like an inhaler called in.   Please advise in PCPs absence. Thank you!

## 2021-09-06 NOTE — Addendum Note (Signed)
Addended by: Binnie Rail on: 09/06/2021 05:07 PM   Modules accepted: Orders

## 2021-09-10 ENCOUNTER — Other Ambulatory Visit: Payer: Self-pay | Admitting: Internal Medicine

## 2021-09-10 NOTE — Telephone Encounter (Signed)
Pt has been informed that Rx was sent.  

## 2021-09-13 ENCOUNTER — Other Ambulatory Visit: Payer: Self-pay | Admitting: Family

## 2021-09-13 DIAGNOSIS — D751 Secondary polycythemia: Secondary | ICD-10-CM

## 2021-09-13 DIAGNOSIS — D45 Polycythemia vera: Secondary | ICD-10-CM

## 2021-09-14 ENCOUNTER — Inpatient Hospital Stay: Payer: Medicare Other | Admitting: Family

## 2021-09-14 ENCOUNTER — Inpatient Hospital Stay: Payer: Medicare Other

## 2021-09-21 ENCOUNTER — Encounter: Payer: Self-pay | Admitting: Pulmonary Disease

## 2021-09-21 ENCOUNTER — Other Ambulatory Visit: Payer: Self-pay

## 2021-09-21 ENCOUNTER — Ambulatory Visit (INDEPENDENT_AMBULATORY_CARE_PROVIDER_SITE_OTHER): Payer: Medicare Other | Admitting: Pulmonary Disease

## 2021-09-21 VITALS — BP 106/70 | HR 98 | Temp 98.0°F | Ht 64.0 in | Wt 145.0 lb

## 2021-09-21 DIAGNOSIS — J418 Mixed simple and mucopurulent chronic bronchitis: Secondary | ICD-10-CM

## 2021-09-21 DIAGNOSIS — R0602 Shortness of breath: Secondary | ICD-10-CM | POA: Diagnosis not present

## 2021-09-21 NOTE — Patient Instructions (Signed)
We will check spirometry today Start Trelegy 200 inhaler Follow-up in 3 months

## 2021-09-21 NOTE — Progress Notes (Signed)
Marilyn Hobbs    [id number]    11/11/1929  Primary Care Physician:Jones, Arvid Right, MD  Referring Physician: Janith Lima, MD [address] [place],  [place] [address]  Chief complaint: Consult for chronic bronchitis, COPD  HPI: 87 year old never smoker with history of COPD, hypertension, hyperlipidemia, hypothyroidism She recently moved from [place] and is here to establish care  She has a diagnosis of COPD and chronic bronchitis still there are no PFTs on record She had previously seen pulmonologist at [place] but I do not have those records to review There is a notation in the chart that she is on Spiriva but as per patient she is not on inhalers at present  Hospitalized in August 2022 in setting of orthostatic hypotension and syncope.  Pets: Dog Occupation: Housewife Exposures: No mold, hot tub, Jacuzzi.  No feather pillows or comforter Smoking history: Never smoker.  Exposed to secondhand smoke in childhood Travel history: Grew up in [place].  Lived in [place] for many years and recently moved to [place] to be with children Relevant family history: No significant family history of lung disease  Outpatient Encounter Medications as of 09/21/2021  Medication Sig   albuterol (VENTOLIN HFA) 108 (90 Base) MCG/ACT inhaler Inhale 2 puffs into the lungs every 6 (six) hours as needed for wheezing or shortness of breath.   ALPRAZolam (XANAX) 0.25 MG tablet Take 0.25 mg by mouth daily as needed for anxiety.   amLODipine (NORVASC) 5 MG tablet Take 1 tablet (5 mg total) by mouth daily.   aspirin EC 81 MG tablet Take 81 mg by mouth daily. Swallow whole.   carboxymethylcellulose (REFRESH PLUS) 0.5 % SOLN Place 1 drop into both eyes 3 (three) times daily as needed.   levothyroxine (SYNTHROID) 25 MCG tablet Take 1 tablet (25 mcg total) by mouth daily before breakfast.   lisinopril-hydrochlorothiazide (ZESTORETIC) 20-12.5 MG tablet Take 1 tablet by mouth daily.   Multiple  Vitamin (MULTIVITAMIN) capsule Take 1 capsule by mouth daily.   rosuvastatin (CRESTOR) 10 MG tablet TAKE ONE TABLET BY MOUTH ONE TIME DAILY   febuxostat (ULORIC) 40 MG tablet Take 40 mg by mouth daily. (Patient not taking: Reported on 09/21/2021)   revefenacin (YUPELRI) 175 MCG/3ML nebulizer solution Take 3 mLs (175 mcg total) by nebulization daily. (Patient not taking: Reported on 09/21/2021)   No facility-administered encounter medications on file as of 09/21/2021.    Allergies as of 09/21/2021   (No Known Allergies)    Past Medical History:  Diagnosis Date   COPD (chronic obstructive pulmonary disease) (HCC)    High cholesterol    Hypertension    Hypothyroidism    Polycythemia     Past Surgical History:  Procedure Laterality Date   BACK SURGERY     THYROIDECTOMY      Family History  Problem Relation Age of Onset   Breast cancer Mother     Social History   Socioeconomic History   Marital status: Single    Spouse name: Not on file   Number of children: Not on file   Years of education: Not on file   Highest education level: Not on file  Occupational History   Not on file  Tobacco Use   Smoking status: Never    Passive exposure: Past   Smokeless tobacco: Never  Vaping Use   Vaping Use: Never used  Substance and Sexual Activity   Alcohol use: Yes    Alcohol/week: 1.0 standard  drink    Types: 1 Glasses of wine per week    Comment: daily   Drug use: Never   Sexual activity: Not on file  Other Topics Concern   Not on file  Social History Narrative   Not on file   Social Determinants of Health   Financial Resource Strain: Not on file  Food Insecurity: Not on file  Transportation Needs: Not on file  Physical Activity: Not on file  Stress: Not on file  Social Connections: Not on file  Intimate Partner Violence: Not on file    Review of systems: Review of Systems  Constitutional: Negative for fever and chills.  HENT: Negative.   Eyes: Negative for  blurred vision.  Respiratory: as per HPI  Cardiovascular: Negative for chest pain and palpitations.  Gastrointestinal: Negative for vomiting, diarrhea, blood per rectum. Genitourinary: Negative for dysuria, urgency, frequency and hematuria.  Musculoskeletal: Negative for myalgias, back pain and joint pain.  Skin: Negative for itching and rash.  Neurological: Negative for dizziness, tremors, focal weakness, seizures and loss of consciousness.  Endo/Heme/Allergies: Negative for environmental allergies.  Psychiatric/Behavioral: Negative for depression, suicidal ideas and hallucinations.  All other systems reviewed and are negative.  Physical Exam: Blood pressure 106/70, pulse 98, temperature 98 F (36.7 C), temperature source Oral, height 5\' 4"  (1.626 m), weight 145 lb (65.8 kg), SpO2 97 %. Gen:      No acute distress HEENT:  EOMI, sclera anicteric Neck:     No masses; no thyromegaly Lungs:    Clear to auscultation bilaterally; normal respiratory effort CV:         Regular rate and rhythm; no murmurs Abd:      + bowel sounds; soft, non-tender; no palpable masses, no distension Ext:    No edema; adequate peripheral perfusion Skin:      Warm and dry; no rash Neuro: alert and oriented x 3 Psych: normal mood and affect  Data Reviewed: Imaging: Chest x-ray 07/04/2021-no active cardiopulmonary disease I have reviewed the images personally.  PFTs:  Labs: CBC 07/04/2021-WBC 9.9, eos 0%  Assessment:  Chronic COPD, bronchitis She is a non-smoker.  Has history of chronic COPD, bronchitis Not on inhalers at present  We were unable to get spirometry due to improvement in lung function today.  She does not want to go through full PFTs We will check we will trial Trelegy inhaler Follow-up in 3 months  Plan/Recommendations: Trial of Trelegy inhaler  Marshell Garfinkel MD [facility] 09/21/2021, 2:16 PM  CC: Janith Lima, MD

## 2021-09-26 ENCOUNTER — Telehealth: Payer: Self-pay | Admitting: Internal Medicine

## 2021-09-26 MED ORDER — LISINOPRIL-HYDROCHLOROTHIAZIDE 20-12.5 MG PO TABS
1.0000 | ORAL_TABLET | Freq: Every day | ORAL | 0 refills | Status: DC
Start: 2021-09-26 — End: 2021-10-13

## 2021-09-26 NOTE — Telephone Encounter (Signed)
1.Medication Requested: lisinopril-hydrochlorothiazide (ZESTORETIC) 20-12.5 MG tablet 2. Pharmacy (Name, Street, [place]): [facility] [place], [place]. [address] Phone:  [phone]  Fax:  [phone]     3. On Med List: Y  4. Last Visit with PCP: 07/27/2021  5. Next visit date with PCP:n/a   Agent: Please be advised that RX refills may take up to 3 business days. We ask that you follow-up with your pharmacy.

## 2021-09-27 MED ORDER — TRELEGY ELLIPTA 200-62.5-25 MCG/ACT IN AEPB: 1.0000 | INHALATION_SPRAY | Freq: Every day | RESPIRATORY_TRACT | 0 refills | Status: DC

## 2021-09-27 NOTE — Addendum Note (Signed)
Addended by: Vanessa Barbara on: 09/27/2021 10:40 AM   Modules accepted: Orders

## 2021-10-03 ENCOUNTER — Telehealth: Payer: Self-pay | Admitting: Pulmonary Disease

## 2021-10-03 MED ORDER — TRELEGY ELLIPTA 200-62.5-25 MCG/ACT IN AEPB: 1.0000 | INHALATION_SPRAY | Freq: Every day | RESPIRATORY_TRACT | 0 refills | Status: AC

## 2021-10-03 NOTE — Telephone Encounter (Signed)
Called and spoke with patient's daughter [name]. She stated that her mother was given a sample of the Trelegy after her last OV. Her mother was not aware that opening and closing the inhaler would waste the puffs. She was trying to get the mechanism correct to use the inhaler. She has only actually used the inhaler 4 times.   She was requesting to have another sample so that she can monitor her mother's usage. I advised her that I would be able to leave a sample at the front desk. She will stop by the office tomorrow to pick up the sample.   Nothing further needed at time of call.

## 2021-10-04 ENCOUNTER — Other Ambulatory Visit: Payer: Medicare Other

## 2021-10-04 ENCOUNTER — Encounter: Payer: Medicare Other | Admitting: Hematology & Oncology

## 2021-10-07 ENCOUNTER — Other Ambulatory Visit: Payer: Self-pay | Admitting: Internal Medicine

## 2021-10-08 ENCOUNTER — Inpatient Hospital Stay: Payer: Medicare Other | Attending: Hematology & Oncology

## 2021-10-08 ENCOUNTER — Emergency Department (HOSPITAL_BASED_OUTPATIENT_CLINIC_OR_DEPARTMENT_OTHER): Payer: Medicare Other

## 2021-10-08 ENCOUNTER — Other Ambulatory Visit: Payer: Self-pay

## 2021-10-08 ENCOUNTER — Inpatient Hospital Stay (HOSPITAL_BASED_OUTPATIENT_CLINIC_OR_DEPARTMENT_OTHER): Payer: Medicare Other | Admitting: Family

## 2021-10-08 ENCOUNTER — Encounter: Payer: Self-pay | Admitting: Family

## 2021-10-08 ENCOUNTER — Encounter (HOSPITAL_BASED_OUTPATIENT_CLINIC_OR_DEPARTMENT_OTHER): Payer: Self-pay | Admitting: *Deleted

## 2021-10-08 ENCOUNTER — Inpatient Hospital Stay (HOSPITAL_BASED_OUTPATIENT_CLINIC_OR_DEPARTMENT_OTHER)
Admission: EM | Admit: 2021-10-08 | Discharge: 2021-10-13 | DRG: 682 | Disposition: A | Discharge status: Home Health | Payer: Medicare Other | Attending: Obstetrics and Gynecology | Admitting: Obstetrics and Gynecology

## 2021-10-08 VITALS — BP 100/62 | HR 100 | Temp 97.9°F | Resp 20 | Ht 64.0 in | Wt 137.1 lb

## 2021-10-08 DIAGNOSIS — E890 Postprocedural hypothyroidism: Secondary | ICD-10-CM | POA: Diagnosis present

## 2021-10-08 DIAGNOSIS — I10 Essential (primary) hypertension: Secondary | ICD-10-CM | POA: Diagnosis present

## 2021-10-08 DIAGNOSIS — N179 Acute kidney failure, unspecified: Principal | ICD-10-CM | POA: Diagnosis present

## 2021-10-08 DIAGNOSIS — N189 Chronic kidney disease, unspecified: Secondary | ICD-10-CM

## 2021-10-08 DIAGNOSIS — E860 Dehydration: Secondary | ICD-10-CM | POA: Diagnosis not present

## 2021-10-08 DIAGNOSIS — D45 Polycythemia vera: Secondary | ICD-10-CM | POA: Insufficient documentation

## 2021-10-08 DIAGNOSIS — R944 Abnormal results of kidney function studies: Secondary | ICD-10-CM | POA: Insufficient documentation

## 2021-10-08 DIAGNOSIS — E878 Other disorders of electrolyte and fluid balance, not elsewhere classified: Secondary | ICD-10-CM | POA: Diagnosis present

## 2021-10-08 DIAGNOSIS — N182 Chronic kidney disease, stage 2 (mild): Secondary | ICD-10-CM | POA: Diagnosis present

## 2021-10-08 DIAGNOSIS — R410 Disorientation, unspecified: Secondary | ICD-10-CM | POA: Diagnosis present

## 2021-10-08 DIAGNOSIS — E7800 Pure hypercholesterolemia, unspecified: Secondary | ICD-10-CM | POA: Diagnosis present

## 2021-10-08 DIAGNOSIS — E871 Hypo-osmolality and hyponatremia: Secondary | ICD-10-CM | POA: Diagnosis present

## 2021-10-08 DIAGNOSIS — Z79899 Other long term (current) drug therapy: Secondary | ICD-10-CM | POA: Insufficient documentation

## 2021-10-08 DIAGNOSIS — Z79890 Hormone replacement therapy (postmenopausal): Secondary | ICD-10-CM

## 2021-10-08 DIAGNOSIS — E039 Hypothyroidism, unspecified: Secondary | ICD-10-CM | POA: Insufficient documentation

## 2021-10-08 DIAGNOSIS — J449 Chronic obstructive pulmonary disease, unspecified: Secondary | ICD-10-CM

## 2021-10-08 DIAGNOSIS — K5900 Constipation, unspecified: Secondary | ICD-10-CM | POA: Diagnosis present

## 2021-10-08 DIAGNOSIS — D751 Secondary polycythemia: Secondary | ICD-10-CM

## 2021-10-08 DIAGNOSIS — Z20822 Contact with and (suspected) exposure to covid-19: Secondary | ICD-10-CM | POA: Diagnosis present

## 2021-10-08 DIAGNOSIS — G9341 Metabolic encephalopathy: Secondary | ICD-10-CM | POA: Diagnosis present

## 2021-10-08 DIAGNOSIS — I129 Hypertensive chronic kidney disease with stage 1 through stage 4 chronic kidney disease, or unspecified chronic kidney disease: Secondary | ICD-10-CM | POA: Diagnosis present

## 2021-10-08 DIAGNOSIS — R5381 Other malaise: Secondary | ICD-10-CM | POA: Diagnosis present

## 2021-10-08 DIAGNOSIS — Z7951 Long term (current) use of inhaled steroids: Secondary | ICD-10-CM

## 2021-10-08 DIAGNOSIS — Z7982 Long term (current) use of aspirin: Secondary | ICD-10-CM | POA: Insufficient documentation

## 2021-10-08 LAB — CBC WITH DIFFERENTIAL (CANCER CENTER ONLY)
Abs Immature Granulocytes: 0.14 10*3/uL — ABNORMAL HIGH (ref 0.00–0.07)
Basophils Absolute: 0.1 10*3/uL (ref 0.0–0.1)
Basophils Relative: 0 %
Eosinophils Absolute: 0 10*3/uL (ref 0.0–0.5)
Eosinophils Relative: 0 %
HCT: 45.2 % (ref 36.0–46.0)
Hemoglobin: 15.8 g/dL — ABNORMAL HIGH (ref 12.0–15.0)
Immature Granulocytes: 1 %
Lymphocytes Relative: 8 %
Lymphs Abs: 0.9 10*3/uL (ref 0.7–4.0)
MCH: 30.9 pg (ref 26.0–34.0)
MCHC: 35 g/dL (ref 30.0–36.0)
MCV: 88.5 fL (ref 80.0–100.0)
Monocytes Absolute: 0.7 10*3/uL (ref 0.1–1.0)
Monocytes Relative: 6 %
Neutro Abs: 9.6 10*3/uL — ABNORMAL HIGH (ref 1.7–7.7)
Neutrophils Relative %: 85 %
Platelet Count: 288 10*3/uL (ref 150–400)
RBC: 5.11 MIL/uL (ref 3.87–5.11)
RDW: 12.5 % (ref 11.5–15.5)
WBC Count: 11.4 10*3/uL — ABNORMAL HIGH (ref 4.0–10.5)
nRBC: 0 % (ref 0.0–0.2)

## 2021-10-08 LAB — CBC WITH DIFFERENTIAL/PLATELET
Abs Immature Granulocytes: 0.11 10*3/uL — ABNORMAL HIGH (ref 0.00–0.07)
Basophils Absolute: 0 10*3/uL (ref 0.0–0.1)
Basophils Relative: 0 %
Eosinophils Absolute: 0 10*3/uL (ref 0.0–0.5)
Eosinophils Relative: 0 %
HCT: 45.2 % (ref 36.0–46.0)
Hemoglobin: 16.1 g/dL — ABNORMAL HIGH (ref 12.0–15.0)
Immature Granulocytes: 1 %
Lymphocytes Relative: 7 %
Lymphs Abs: 0.7 10*3/uL (ref 0.7–4.0)
MCH: 31.2 pg (ref 26.0–34.0)
MCHC: 35.6 g/dL (ref 30.0–36.0)
MCV: 87.6 fL (ref 80.0–100.0)
Monocytes Absolute: 0.8 10*3/uL (ref 0.1–1.0)
Monocytes Relative: 7 %
Neutro Abs: 9.8 10*3/uL — ABNORMAL HIGH (ref 1.7–7.7)
Neutrophils Relative %: 85 %
Platelets: 306 10*3/uL (ref 150–400)
RBC: 5.16 MIL/uL — ABNORMAL HIGH (ref 3.87–5.11)
RDW: 12.7 % (ref 11.5–15.5)
WBC: 11.5 10*3/uL — ABNORMAL HIGH (ref 4.0–10.5)
nRBC: 0 % (ref 0.0–0.2)

## 2021-10-08 LAB — CMP (CANCER CENTER ONLY)
ALT: 36 U/L (ref 0–44)
AST: 19 U/L (ref 15–41)
Albumin: 4.3 g/dL (ref 3.5–5.0)
Alkaline Phosphatase: 71 U/L (ref 38–126)
Anion gap: 13 (ref 5–15)
BUN: 51 mg/dL — ABNORMAL HIGH (ref 8–23)
CO2: 23 mmol/L (ref 22–32)
Calcium: 10.6 mg/dL — ABNORMAL HIGH (ref 8.9–10.3)
Chloride: 87 mmol/L — ABNORMAL LOW (ref 98–111)
Creatinine: 1.91 mg/dL — ABNORMAL HIGH (ref 0.44–1.00)
GFR, Estimated: 25 mL/min — ABNORMAL LOW (ref >60)
Glucose, Bld: 122 mg/dL — ABNORMAL HIGH (ref 70–99)
Potassium: 4.7 mmol/L (ref 3.5–5.1)
Sodium: 123 mmol/L — ABNORMAL LOW (ref 135–145)
Total Bilirubin: 0.7 mg/dL (ref 0.3–1.2)
Total Protein: 7.2 g/dL (ref 6.5–8.1)

## 2021-10-08 LAB — BASIC METABOLIC PANEL
Anion gap: 12 (ref 5–15)
BUN: 46 mg/dL — ABNORMAL HIGH (ref 8–23)
CO2: 20 mmol/L — ABNORMAL LOW (ref 22–32)
Calcium: 9.1 mg/dL (ref 8.9–10.3)
Chloride: 93 mmol/L — ABNORMAL LOW (ref 98–111)
Creatinine, Ser: 1.67 mg/dL — ABNORMAL HIGH (ref 0.44–1.00)
GFR, Estimated: 29 mL/min — ABNORMAL LOW (ref >60)
Glucose, Bld: 107 mg/dL — ABNORMAL HIGH (ref 70–99)
Potassium: 4.5 mmol/L (ref 3.5–5.1)
Sodium: 125 mmol/L — ABNORMAL LOW (ref 135–145)

## 2021-10-08 LAB — COMPREHENSIVE METABOLIC PANEL
ALT: 36 U/L (ref 0–44)
AST: 27 U/L (ref 15–41)
Albumin: 4.2 g/dL (ref 3.5–5.0)
Alkaline Phosphatase: 76 U/L (ref 38–126)
Anion gap: 14 (ref 5–15)
BUN: 47 mg/dL — ABNORMAL HIGH (ref 8–23)
CO2: 21 mmol/L — ABNORMAL LOW (ref 22–32)
Calcium: 10.2 mg/dL (ref 8.9–10.3)
Chloride: 89 mmol/L — ABNORMAL LOW (ref 98–111)
Creatinine, Ser: 1.93 mg/dL — ABNORMAL HIGH (ref 0.44–1.00)
GFR, Estimated: 24 mL/min — ABNORMAL LOW (ref >60)
Glucose, Bld: 116 mg/dL — ABNORMAL HIGH (ref 70–99)
Potassium: 4.8 mmol/L (ref 3.5–5.1)
Sodium: 124 mmol/L — ABNORMAL LOW (ref 135–145)
Total Bilirubin: 0.8 mg/dL (ref 0.3–1.2)
Total Protein: 6.7 g/dL (ref 6.5–8.1)

## 2021-10-08 LAB — RETICULOCYTES
Immature Retic Fract: 4.2 % (ref 2.3–15.9)
RBC.: 5.05 MIL/uL (ref 3.87–5.11)
Retic Count, Absolute: 90.4 10*3/uL (ref 19.0–186.0)
Retic Ct Pct: 1.8 % (ref 0.4–3.1)

## 2021-10-08 LAB — TROPONIN I (HIGH SENSITIVITY)
Troponin I (High Sensitivity): 4 ng/L (ref <18)
Troponin I (High Sensitivity): 4 ng/L (ref <18)

## 2021-10-08 LAB — LACTATE DEHYDROGENASE: LDH: 206 U/L — ABNORMAL HIGH (ref 98–192)

## 2021-10-08 LAB — OSMOLALITY: Osmolality: 272 mOsm/kg — ABNORMAL LOW (ref 275–295)

## 2021-10-08 LAB — RESP PANEL BY RT-PCR (FLU A&B, COVID) ARPGX2
Influenza A by PCR: NEGATIVE
Influenza B by PCR: NEGATIVE
SARS Coronavirus 2 by RT PCR: NEGATIVE

## 2021-10-08 LAB — SAVE SMEAR(SSMR), FOR PROVIDER SLIDE REVIEW

## 2021-10-08 LAB — URIC ACID: Uric Acid, Serum: 8.8 mg/dL — ABNORMAL HIGH (ref 2.5–7.1)

## 2021-10-08 MED ORDER — ROSUVASTATIN CALCIUM 10 MG PO TABS
10.0000 mg | ORAL_TABLET | Freq: Every day | ORAL | Status: DC
  Administered 2021-10-09 – 2021-10-13 (×5): 10 mg via ORAL
  Filled 2021-10-08 (×5): qty 1

## 2021-10-08 MED ORDER — ALBUTEROL SULFATE (2.5 MG/3ML) 0.083% IN NEBU
2.5000 mg | INHALATION_SOLUTION | RESPIRATORY_TRACT | Status: DC | PRN
  Administered 2021-10-09 – 2021-10-13 (×4): 2.5 mg via RESPIRATORY_TRACT
  Filled 2021-10-08 (×4): qty 3

## 2021-10-08 MED ORDER — LISINOPRIL 20 MG PO TABS
20.0000 mg | ORAL_TABLET | Freq: Every day | ORAL | Status: DC
  Administered 2021-10-09 – 2021-10-10 (×2): 20 mg via ORAL
  Filled 2021-10-08 (×2): qty 1

## 2021-10-08 MED ORDER — ONDANSETRON HCL 4 MG PO TABS: 4.0000 mg | ORAL_TABLET | Freq: Four times a day (QID) | ORAL | Status: DC | PRN

## 2021-10-08 MED ORDER — SODIUM CHLORIDE 0.9 % IV SOLN: Freq: Once | INTRAVENOUS | Status: AC

## 2021-10-08 MED ORDER — HEPARIN SODIUM (PORCINE) 5000 UNIT/ML IJ SOLN
5000.0000 [IU] | Freq: Three times a day (TID) | INTRAMUSCULAR | Status: DC
  Administered 2021-10-09 – 2021-10-11 (×6): 5000 [IU] via SUBCUTANEOUS
  Filled 2021-10-08 (×6): qty 1

## 2021-10-08 MED ORDER — SENNOSIDES-DOCUSATE SODIUM 8.6-50 MG PO TABS: 1.0000 | ORAL_TABLET | Freq: Every evening | ORAL | Status: DC | PRN

## 2021-10-08 MED ORDER — ACETAMINOPHEN 650 MG RE SUPP: 650.0000 mg | Freq: Four times a day (QID) | RECTAL | Status: DC | PRN

## 2021-10-08 MED ORDER — AMLODIPINE BESYLATE 5 MG PO TABS
5.0000 mg | ORAL_TABLET | Freq: Every day | ORAL | Status: DC
  Administered 2021-10-09 – 2021-10-10 (×2): 5 mg via ORAL
  Filled 2021-10-08 (×3): qty 1

## 2021-10-08 MED ORDER — ACETAMINOPHEN 325 MG PO TABS
650.0000 mg | ORAL_TABLET | Freq: Four times a day (QID) | ORAL | Status: DC | PRN
  Administered 2021-10-12 (×2): 650 mg via ORAL
  Filled 2021-10-08 (×3): qty 2

## 2021-10-08 MED ORDER — FLUTICASONE-UMECLIDIN-VILANT 200-62.5-25 MCG/ACT IN AEPB: 1.0000 | INHALATION_SPRAY | Freq: Every day | RESPIRATORY_TRACT | Status: DC

## 2021-10-08 MED ORDER — LEVOTHYROXINE SODIUM 25 MCG PO TABS
25.0000 ug | ORAL_TABLET | Freq: Every day | ORAL | Status: DC
  Administered 2021-10-09 – 2021-10-13 (×5): 25 ug via ORAL
  Filled 2021-10-08 (×5): qty 1

## 2021-10-08 MED ORDER — ONDANSETRON HCL 4 MG/2ML IJ SOLN: 4.0000 mg | Freq: Four times a day (QID) | INTRAMUSCULAR | Status: DC | PRN

## 2021-10-08 MED ORDER — SODIUM CHLORIDE 0.9 % IV SOLN: INTRAVENOUS | Status: DC

## 2021-10-08 MED ORDER — ALBUTEROL SULFATE HFA 108 (90 BASE) MCG/ACT IN AERS: 2.0000 | INHALATION_SPRAY | RESPIRATORY_TRACT | Status: DC | PRN

## 2021-10-08 NOTE — ED Provider Notes (Signed)
[facility] Provider Note   CSN: [id number] Arrival date & time: 10/08/21  1612     History Chief Complaint  Patient presents with   Shortness of Breath    Jenavi Beedle is a 87 y.o. female with PMH COPD, HTN, hypothyroidism, polycythemia vera who presents emergency department from her outpatient Hematology appointment due to altered mental status and abnormal labs.  The patient is currently unable to remember why she saw the hematologist today and history obtained from patient's daughter.  Patient's daughter states that the patient has been eating less recently and she has a hard time getting the patient to drink water.  The patient currently denies chest pain, abdominal pain, nausea, vomiting or other systemic symptoms.  She does have mild dyspnea which she states is chronic.  Outpatient labs showing significant new hyponatremia, new AKI concerning for dehydration.   Shortness of Breath Associated symptoms: no abdominal pain, no chest pain, no cough, no ear pain, no fever, no rash, no sore throat and no vomiting       Past Medical History:  Diagnosis Date   COPD (chronic obstructive pulmonary disease) ([place])    High cholesterol    Hypertension    Hypothyroidism    Polycythemia     Patient Active Problem List   Diagnosis Date Noted   Hyponatremia 10/08/2021   Acute kidney injury superimposed on CKD ([place]) 10/08/2021   Dehydration 10/08/2021   Confusion 10/08/2021   COPD (chronic obstructive pulmonary disease) ([place]) 10/08/2021   Mixed simple and mucopurulent chronic bronchitis ([place]) 08/24/2021   Postoperative hypothyroidism 07/12/2021   Polycythemia vera ([place]) 07/12/2021   Essential hypertension 07/04/2021   CKD (chronic kidney disease), stage III ([place]) 07/04/2021    Past Surgical History:  Procedure Laterality Date   BACK SURGERY     THYROIDECTOMY       OB History   No obstetric history on file.     Family History  Problem  Relation Age of Onset   Breast cancer Mother     Social History   Tobacco Use   Smoking status: Never    Passive exposure: Past   Smokeless tobacco: Never  Vaping Use   Vaping Use: Never used  Substance Use Topics   Alcohol use: Yes    Alcohol/week: 1.0 standard drink    Types: 1 Glasses of wine per week    Comment: daily   Drug use: Never    Home Medications Prior to Admission medications   Medication Sig Start Date End Date Taking? Authorizing Provider  albuterol (VENTOLIN HFA) 108 (90 Base) MCG/ACT inhaler Inhale 2 puffs into the lungs every 6 (six) hours as needed for wheezing or shortness of breath. 09/06/21   Binnie Rail, MD  ALPRAZolam Duanne Moron) 0.25 MG tablet TAKE ONE TABLET BY MOUTH ONE TIME DAILY AS NEEDED ANXIETY 10/08/21   Janith Lima, MD  amLODipine (NORVASC) 5 MG tablet Take 1 tablet (5 mg total) by mouth daily. 08/20/21   Janith Lima, MD  aspirin EC 81 MG tablet Take 81 mg by mouth daily. Swallow whole.    [provider]  carboxymethylcellulose (REFRESH PLUS) 0.5 % SOLN Place 1 drop into both eyes 3 (three) times daily as needed.    [provider]  febuxostat (ULORIC) 40 MG tablet Take 40 mg by mouth daily. Patient not taking: Reported on 09/21/2021    [provider]  Fluticasone-Umeclidin-Vilant (TRELEGY ELLIPTA) 200-62.5-25 MCG/ACT AEPB Inhale 1 puff into the  lungs daily. 09/27/21   Mannam, Hart Robinsons, MD  Fluticasone-Umeclidin-Vilant (TRELEGY ELLIPTA) 200-62.5-25 MCG/ACT AEPB Inhale 1 puff into the lungs daily. 10/03/21   Marshell Garfinkel, MD  levothyroxine (SYNTHROID) 25 MCG tablet Take 1 tablet (25 mcg total) by mouth daily before breakfast. 07/13/21   Janith Lima, MD  lisinopril-hydrochlorothiazide (ZESTORETIC) 20-12.5 MG tablet Take 1 tablet by mouth daily. 09/26/21   Janith Lima, MD  Multiple Vitamin (MULTIVITAMIN) capsule Take 1 capsule by mouth daily.    [provider]  revefenacin (YUPELRI) 175 MCG/3ML  nebulizer solution Take 3 mLs (175 mcg total) by nebulization daily. Patient not taking: Reported on 09/21/2021 08/24/21   Janith Lima, MD  rosuvastatin (CRESTOR) 10 MG tablet TAKE ONE TABLET BY MOUTH ONE TIME DAILY 09/10/21   Janith Lima, MD  SPIRIVA HANDIHALER 18 MCG inhalation capsule Place into inhaler and inhale. 04/18/21   [provider]    Allergies    Patient has no known allergies.  Review of Systems   Review of Systems  Constitutional:  Negative for chills and fever.  HENT:  Negative for ear pain and sore throat.   Eyes:  Negative for pain and visual disturbance.  Respiratory:  Positive for shortness of breath. Negative for cough.   Cardiovascular:  Negative for chest pain and palpitations.  Gastrointestinal:  Negative for abdominal pain and vomiting.  Genitourinary:  Negative for dysuria and hematuria.  Musculoskeletal:  Negative for arthralgias and back pain.  Skin:  Negative for color change and rash.  Neurological:  Negative for seizures and syncope.  Psychiatric/Behavioral:  Positive for confusion.   All other systems reviewed and are negative.  Physical Exam Updated Vital Signs BP 109/63 (BP Location: Right Arm)   Pulse 87   Temp 98.2 F (36.8 C) (Axillary)   Resp 18   Ht 5\' 4"  (1.626 m)   Wt 63.3 kg   SpO2 100%   BMI 23.95 kg/m   Physical Exam Vitals and nursing note reviewed.  Constitutional:      General: She is not in acute distress.    Appearance: She is well-developed.  HENT:     Head: Normocephalic and atraumatic.     Comments: Dry tacky mucous membranes Eyes:     Conjunctiva/sclera: Conjunctivae normal.  Cardiovascular:     Rate and Rhythm: Normal rate and regular rhythm.     Heart sounds: No murmur heard. Pulmonary:     Effort: Pulmonary effort is normal. No respiratory distress.     Breath sounds: Normal breath sounds.  Abdominal:     Palpations: Abdomen is soft.     Tenderness: There is no abdominal tenderness.   Musculoskeletal:        General: No swelling.     Cervical back: Neck supple.  Skin:    General: Skin is warm and dry.     Capillary Refill: Capillary refill takes less than 2 seconds.  Neurological:     Mental Status: She is alert.  Psychiatric:        Mood and Affect: Mood normal.    ED Results / Procedures / Treatments   Labs (all labs ordered are listed, but only abnormal results are displayed) Labs Reviewed  COMPREHENSIVE METABOLIC PANEL - Abnormal; Notable for the following components:      Result Value   Sodium 124 (*)    Chloride 89 (*)    CO2 21 (*)    Glucose, Bld 116 (*)    BUN 47 (*)  Creatinine, Ser 1.93 (*)    GFR, Estimated 24 (*)    All other components within normal limits  CBC WITH DIFFERENTIAL/PLATELET - Abnormal; Notable for the following components:   WBC 11.5 (*)    RBC 5.16 (*)    Hemoglobin 16.1 (*)    Neutro Abs 9.8 (*)    Abs Immature Granulocytes 0.11 (*)    All other components within normal limits  OSMOLALITY - Abnormal; Notable for the following components:   Osmolality 272 (*)    All other components within normal limits  URIC ACID - Abnormal; Notable for the following components:   Uric Acid, Serum 8.8 (*)    All other components within normal limits  RESP PANEL BY RT-PCR (FLU A&B, COVID) ARPGX2  NA AND K (SODIUM & POTASSIUM), RAND UR  URINALYSIS, ROUTINE W REFLEX MICROSCOPIC  OSMOLALITY, URINE  BASIC METABOLIC PANEL  BASIC METABOLIC PANEL  BASIC METABOLIC PANEL  CBC  TROPONIN I (HIGH SENSITIVITY)  TROPONIN I (HIGH SENSITIVITY)    EKG None  Radiology DG Chest 2 View  Result Date: 10/08/2021 CLINICAL DATA:  Shortness of breath EXAM: CHEST - 2 VIEW COMPARISON:  07/04/2021 FINDINGS: Cardiac size is within normal limits. Low position of diaphragms and increase in AP diameter of chest suggests COPD. There are no signs of alveolar pulmonary edema or focal pulmonary consolidation. There is haziness at left cardiophrenic angle  which may suggest pleural adhesions. There is no pleural effusion or pneumothorax. IMPRESSION: COPD. There are no signs of pulmonary edema or focal pulmonary consolidation. Electronically Signed   By: Elmer Picker M.D.   On: 10/08/2021 16:57   CT Head Wo Contrast  Result Date: 10/08/2021 CLINICAL DATA:  Delirium confusion EXAM: CT HEAD WITHOUT CONTRAST TECHNIQUE: Contiguous axial images were obtained from the base of the skull through the vertex without intravenous contrast. COMPARISON:  Head CT 07/04/2021 FINDINGS: Brain: No acute territorial infarction, hemorrhage or intracranial mass. Moderate atrophy. Mild hypodensity in the white matter consistent with chronic small vessel ischemic change. Nonenlarged ventricles Vascular: No hyperdense vessels.  Carotid vascular calcification Skull: Normal. Negative for fracture or focal lesion. Sinuses/Orbits: No acute finding. Other: None IMPRESSION: 1. No CT evidence for acute intracranial abnormality. 2. Atrophy and mild chronic small vessel ischemic changes of the white matter. Electronically Signed   By: Donavan Foil M.D.   On: 10/08/2021 19:03    Procedures Procedures   Medications Ordered in ED Medications  amLODipine (NORVASC) tablet 5 mg (has no administration in time range)  rosuvastatin (CRESTOR) tablet 10 mg (has no administration in time range)  levothyroxine (SYNTHROID) tablet 25 mcg (has no administration in time range)  Fluticasone-Umeclidin-Vilant 200-62.5-25 MCG/ACT AEPB 1 puff (has no administration in time range)  albuterol (VENTOLIN HFA) 108 (90 Base) MCG/ACT inhaler 2 puff (has no administration in time range)  lisinopril (ZESTRIL) tablet 20 mg (has no administration in time range)  heparin injection 5,000 Units (has no administration in time range)  0.9 %  sodium chloride infusion (has no administration in time range)  acetaminophen (TYLENOL) tablet 650 mg (has no administration in time range)    Or  acetaminophen (TYLENOL)  suppository 650 mg (has no administration in time range)  ondansetron (ZOFRAN) tablet 4 mg (has no administration in time range)    Or  ondansetron (ZOFRAN) injection 4 mg (has no administration in time range)  senna-docusate (Senokot-S) tablet 1 tablet (has no administration in time range)  0.9 %  sodium chloride infusion ( Intravenous  New Bag/Given 10/08/21 1926)    ED Course  I have reviewed the triage vital signs and the nursing notes.  Pertinent labs & imaging results that were available during my care of the patient were reviewed by me and considered in my medical decision making (see chart for details).    MDM Rules/Calculators/A&P                           Patient seen the emergency department for evaluation of altered mental status and hyponatremia.  Physical exam reveals dry tacky mucous membranes but is otherwise unremarkable.  Patient is currently having difficulty remembering why she is in the emergency department.  Laboratory evaluation revealing hyponatremia to 124, hypochloremia to 89, BUN 47, creatinine 1.93 which is an elevation for this patient likely prerenal.  Leukocytosis to 11.5, COVID and flu negative, elevated uric acid to 8.8, decreased serum awesome's to 272.  Urine lites are currently pending.  Chest x-ray and CT head unremarkable.  Patient will require admission for altered mental status in the setting of new hyponatremia likely hypovolemic.  She was started on a very low rate of normal saline at 75 and admitted to [facility]. Final Clinical Impression(s) / ED Diagnoses Final diagnoses:  Hyponatremia    Rx / DC Orders ED Discharge Orders     None        Raelyn Racette, Debe Coder, MD 10/08/21 2312

## 2021-10-08 NOTE — ED Notes (Signed)
Patient transported to X-ray 

## 2021-10-08 NOTE — ED Triage Notes (Signed)
Brought down from [place] MD for Dx dehydration pt c/o SOB

## 2021-10-08 NOTE — ED Notes (Signed)
Called patient's daughter, [name], and informed her that pt had left to go to [facility].  Information given to pt's daughter.

## 2021-10-08 NOTE — Progress Notes (Addendum)
Hematology/Oncology Consultation   Name: Marilyn Hobbs      MRN: [id number]    Location: Room/bed info not found  Date: 10/08/2021 Time:3:43 PM   REFERRING PHYSICIAN: Scarlette Calico, MD  REASON FOR CONSULT:  Polycythemia vera   DIAGNOSIS: Polycythemia   HISTORY OF PRESENT ILLNESS: Marilyn Hobbs is a very pleasant 87 yo caucasian female with history of polycythemia vera diagnosed years ago while she was living in [place]. She states that she has been phlebotomized in the past as needed.  Hct today is 45.2% and Hgb 15.8. WBC count 11.4 and platelets 288.  She is taking a baby aspirin daily.  Patient is able to give medical history. Daughter is also with her today.  She denies fatigue but states that she does have some SOB at times with or without activity.  She is noted to have a sodium level of 123, chloride 87, BUN 51 and creatinine 1.91.  She notes that she occasionally has some brain fog. She states that she does not hydrate well at home. Her daughter states that this has been a long standing issue and that they constantly remind her to hydrated.  No personal history of stroke or thrombotic event.  No known liver disease.  She notes occasional abdominal discomfort.  No fever, chills, n/v, cough, rash, dizziness, chest pain, palpitations or changes in bowel or bladder habits.  No swelling, tenderness, numbness or tingling in her extremities at this time.  She states that she had falls on 11/8 and 11/9. She states that she was backing up with her walker at the time of her falls.  She is ambulating with a cane for added support today.  She states that she has never had surgery.  No personal history of cancer. Her mother had breast cancer.  She is on synthroid for hypothyroidism.  No history of diabetes.  No smoking or recreational drug use.  She has a glass or wine or a mixed drink every evening.  She states that her appetite is down. Her weight is 139 lbs.  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  She has 2 children and no history of miscarriage.   ROS: All other 10 point review of systems is negative.   PAST MEDICAL HISTORY:   Past Medical History:  Diagnosis Date   COPD (chronic obstructive pulmonary disease) ([facility])    High cholesterol    Hypertension    Hypothyroidism    Polycythemia     ALLERGIES: No Known Allergies    MEDICATIONS:  Current Outpatient Medications on File Prior to Visit  Medication Sig Dispense Refill   albuterol (VENTOLIN HFA) 108 (90 Base) MCG/ACT inhaler Inhale 2 puffs into the lungs every 6 (six) hours as needed for wheezing or shortness of breath. 8 g 8   ALPRAZolam (XANAX) 0.25 MG tablet TAKE ONE TABLET BY MOUTH ONE TIME DAILY AS NEEDED ANXIETY 30 tablet 5   amLODipine (NORVASC) 5 MG tablet Take 1 tablet (5 mg total) by mouth daily. 90 tablet 1   aspirin EC 81 MG tablet Take 81 mg by mouth daily. Swallow whole.     carboxymethylcellulose (REFRESH PLUS) 0.5 % SOLN Place 1 drop into both eyes 3 (three) times daily as needed.     Fluticasone-Umeclidin-Vilant (TRELEGY ELLIPTA) 200-62.5-25 MCG/ACT AEPB Inhale 1 puff into the lungs daily. 2 each 0   Fluticasone-Umeclidin-Vilant (TRELEGY ELLIPTA) 200-62.5-25 MCG/ACT AEPB Inhale 1 puff into the lungs daily. 1 each 0  levothyroxine (SYNTHROID) 25 MCG tablet Take 1 tablet (25 mcg total) by mouth daily before breakfast. 90 tablet 0   lisinopril-hydrochlorothiazide (ZESTORETIC) 20-12.5 MG tablet Take 1 tablet by mouth daily. 90 tablet 0   Multiple Vitamin (MULTIVITAMIN) capsule Take 1 capsule by mouth daily.     rosuvastatin (CRESTOR) 10 MG tablet TAKE ONE TABLET BY MOUTH ONE TIME DAILY 90 tablet 1   SPIRIVA HANDIHALER 18 MCG inhalation capsule Place into inhaler and inhale.     febuxostat (ULORIC) 40 MG tablet Take 40 mg by mouth daily. (Patient not taking: Reported on 09/21/2021)     revefenacin (YUPELRI) 175 MCG/3ML nebulizer solution Take 3 mLs (175 mcg total) by nebulization  daily. (Patient not taking: Reported on 09/21/2021) 90 mL 1   No current facility-administered medications on file prior to visit.     PAST SURGICAL HISTORY Past Surgical History:  Procedure Laterality Date   BACK SURGERY     THYROIDECTOMY      FAMILY HISTORY: Family History  Problem Relation Age of Onset   Breast cancer Mother     SOCIAL HISTORY:  reports that she has never smoked. She has been exposed to tobacco smoke. She has never used smokeless tobacco. She reports current alcohol use of about 1.0 standard drink per week. She reports that she does not use drugs.  PERFORMANCE STATUS: The patient's performance status is 2 - Symptomatic, <50% confined to bed  PHYSICAL EXAM: Most Recent Vital Signs: Blood pressure 100/62, pulse 100, temperature 97.9 F (36.6 C), temperature source Oral, resp. rate 20, height 5\' 4"  (1.626 m), weight 137 lb 1.9 oz (62.2 kg), SpO2 100 %. BP 100/62 (BP Location: Left Arm, Patient Position: Sitting)   Pulse 100   Temp 97.9 F (36.6 C) (Oral)   Resp 20   Ht 5\' 4"  (1.626 m)   Wt 137 lb 1.9 oz (62.2 kg)   SpO2 100%   BMI 23.54 kg/m   General Appearance:    Alert, cooperative, no distress, appears stated age  Head:    Normocephalic, without obvious abnormality, atraumatic  Eyes:    PERRL, conjunctiva/corneas clear, EOM's intact, fundi    benign, both eyes        Throat:   Lips, mucosa, and tongue normal; teeth and gums normal  Neck:   Supple, symmetrical, trachea midline, no adenopathy;    thyroid:  no enlargement/tenderness/nodules; no carotid   bruit or JVD  Back:     Symmetric, no curvature, ROM normal, no CVA tenderness  Lungs:     Clear to auscultation bilaterally, respirations unlabored  Chest Wall:    No tenderness or deformity   Heart:    Regular rate and rhythm, S1 and S2 normal, no murmur, rub   or gallop     Abdomen:     Soft, non-tender, bowel sounds active all four quadrants,    no masses, no organomegaly         Extremities:   Extremities normal, atraumatic, no cyanosis or edema  Pulses:   2+ and symmetric all extremities  Skin:   Skin color, texture, turgor normal, no rashes or lesions  Lymph nodes:   Cervical, supraclavicular, and axillary nodes normal  Neurologic:   CNII-XII intact, normal strength, sensation and reflexes    throughout    LABORATORY DATA:  Results for orders placed or performed in visit on 10/08/21 (from the past 48 hour(s))  CBC with Differential (Cancer Center Only)     Status: Abnormal  Collection Time: 10/08/21  3:01 PM  Result Value Ref Range   WBC Count 11.4 (H) 4.0 - 10.5 K/uL   RBC 5.11 3.87 - 5.11 MIL/uL   Hemoglobin 15.8 (H) 12.0 - 15.0 g/dL   HCT 45.2 36.0 - 46.0 %   MCV 88.5 80.0 - 100.0 fL   MCH 30.9 26.0 - 34.0 pg   MCHC 35.0 30.0 - 36.0 g/dL   RDW 12.5 11.5 - 15.5 %   Platelet Count 288 150 - 400 K/uL   nRBC 0.0 0.0 - 0.2 %   Neutrophils Relative % 85 %   Neutro Abs 9.6 (H) 1.7 - 7.7 K/uL   Lymphocytes Relative 8 %   Lymphs Abs 0.9 0.7 - 4.0 K/uL   Monocytes Relative 6 %   Monocytes Absolute 0.7 0.1 - 1.0 K/uL   Eosinophils Relative 0 %   Eosinophils Absolute 0.0 0.0 - 0.5 K/uL   Basophils Relative 0 %   Basophils Absolute 0.1 0.0 - 0.1 K/uL   Immature Granulocytes 1 %   Abs Immature Granulocytes 0.14 (H) 0.00 - 0.07 K/uL    Comment: Performed at [facility] at [facility], [address] [place], [place] [address]  [facility])     Status: None   Collection Time: 10/08/21  3:01 PM  Result Value Ref Range   Smear Review SMEAR STAINED AND AVAILABLE FOR REVIEW     Comment: Performed at [facility] at [facility], [address], [place], [place] [address]  Reticulocytes     Status: None   Collection Time: 10/08/21  3:02 PM  Result Value Ref Range   Retic Ct Pct 1.8 0.4 - 3.1 %   RBC. 5.05 3.87 - 5.11 MIL/uL   Retic Count, Absolute 90.4 19.0 - 186.0 K/uL   Immature Retic Fract 4.2  2.3 - 15.9 %    Comment: Performed at [facility] at [facility], [address], [place], [place] [address]      RADIOGRAPHY: No results found.     PATHOLOGY: None  ASSESSMENT/PLAN: Ms. Valade is a very pleasant 87 yo caucasian female with history of polycythemia vera diagnosed years ago while she was living in [place].  He Hct at this time is stable. More concerning is her dehydration with new hyponatremia and elevated BUN and creatinine.  We advised she go downstairs to the ED for further eval and hydration. Both she and her daughter are in agreement.  Report called to MD in [facility] ED.  Patient taken to ED by nursing staff.  We will plan to see her for follow-up in 5 weeks.   All questions were answered. The patient knows to call the clinic with any problems, questions or concerns. We can certainly see the patient much sooner if necessary.  The patient was discussed with Dr. Marin Olp and he is in agreement with the aforementioned.   Lottie Dawson, NP    Addendum: Melburn Popper 2 was negative and iron studies were unremarkable. Polycythemia likely secondary to COPD.   10/26/2021 2:51 PM

## 2021-10-08 NOTE — H&P (Signed)
History and Physical    Marilyn Hobbs YQM:[id number] DOB: 01-Mar-1929 DOA: 10/08/2021  PCP: Janith Lima, MD   Patient coming from: Home  Chief Complaint: Confusion, dehydration  HPI: Marilyn Hobbs is a 87 y.o. female with medical history significant for polycythemia vera, HTN, hypothyroidism, COPD, HLD who presents from [facility] ER for low sodium and dehydration. Marilyn Hobbs is confused and is unsure of why she was sent to the hospital.  Daughter who she lives with is not at the bedside.  Per report patient was seen by her oncologist today for follow-up and felt to be dehydrated and found of low sodium was sent to the emergency room.  Was given IV fluid hydration and sodium increased mildly from 123-124 was then sent to [facility] for observation and further treatment.  There is no report of any abdominal pain, nausea, vomiting, diarrhea.  It is unclear at this time what her p.o. intake is been the last few days.  Attempted to call daughter to obtain more history but there is no answer.   ED Course: Marilyn Hobbs has been afebrile and hemodynamically stable in the emergency room.  She was given IV fluid hydration with normal saline at a rate of 75 ml/hr. no complaints at this time.  Lab work shows WBC 11,500 hemoglobin 16.1 hematocrit 45.2 platelets 306,000.  Sodium 124 potassium 4.8 chloride 89 bicarb 21 creatinine 1.93 of one 1.06.  BUN 47 glucose 116 LFTs normal LDH 206 troponin 4 serum osmolality 272.  Urine sodium level is pending.  Hospitalist service asked to admit patient for further management  Review of Systems:  Unable to obtain review of systems secondary to confusion  Past Medical History:  Diagnosis Date   COPD (chronic obstructive pulmonary disease) ([place])    High cholesterol    Hypertension    Hypothyroidism    Polycythemia     Past Surgical History:  Procedure Laterality Date   BACK SURGERY     THYROIDECTOMY      Social History  reports that she has never smoked. She has  been exposed to tobacco smoke. She has never used smokeless tobacco. She reports current alcohol use of about 1.0 standard drink per week. She reports that she does not use drugs.  No Known Allergies  Family History  Problem Relation Age of Onset   Breast cancer Mother      Prior to Admission medications   Medication Sig Start Date End Date Taking? Authorizing Provider  albuterol (VENTOLIN HFA) 108 (90 Base) MCG/ACT inhaler Inhale 2 puffs into the lungs every 6 (six) hours as needed for wheezing or shortness of breath. 09/06/21   Binnie Rail, MD  ALPRAZolam Duanne Moron) 0.25 MG tablet TAKE ONE TABLET BY MOUTH ONE TIME DAILY AS NEEDED ANXIETY 10/08/21   Janith Lima, MD  amLODipine (NORVASC) 5 MG tablet Take 1 tablet (5 mg total) by mouth daily. 08/20/21   Janith Lima, MD  aspirin EC 81 MG tablet Take 81 mg by mouth daily. Swallow whole.    [provider]  carboxymethylcellulose (REFRESH PLUS) 0.5 % SOLN Place 1 drop into both eyes 3 (three) times daily as needed.    [provider]  febuxostat (ULORIC) 40 MG tablet Take 40 mg by mouth daily. Patient not taking: Reported on 09/21/2021    [provider]  Fluticasone-Umeclidin-Vilant (TRELEGY ELLIPTA) 200-62.5-25 MCG/ACT AEPB Inhale 1 puff into the lungs daily. 09/27/21   Marshell Garfinkel, MD  Fluticasone-Umeclidin-Vilant (TRELEGY ELLIPTA) 200-62.5-25  MCG/ACT AEPB Inhale 1 puff into the lungs daily. 10/03/21   Marshell Garfinkel, MD  levothyroxine (SYNTHROID) 25 MCG tablet Take 1 tablet (25 mcg total) by mouth daily before breakfast. 07/13/21   Janith Lima, MD  lisinopril-hydrochlorothiazide (ZESTORETIC) 20-12.5 MG tablet Take 1 tablet by mouth daily. 09/26/21   Janith Lima, MD  Multiple Vitamin (MULTIVITAMIN) capsule Take 1 capsule by mouth daily.    [provider]  revefenacin (YUPELRI) 175 MCG/3ML nebulizer solution Take 3 mLs (175 mcg total) by nebulization daily. Patient not taking: Reported on  09/21/2021 08/24/21   Janith Lima, MD  rosuvastatin (CRESTOR) 10 MG tablet TAKE ONE TABLET BY MOUTH ONE TIME DAILY 09/10/21   Janith Lima, MD  SPIRIVA HANDIHALER 18 MCG inhalation capsule Place into inhaler and inhale. 04/18/21   [provider]    Physical Exam: Vitals:   10/08/21 2115 10/08/21 2128 10/08/21 2159 10/08/21 2200  BP: 124/68   109/63  Pulse: 87   87  Resp: 16   18  Temp:  97.7 F (36.5 C)  98.2 F (36.8 C)  TempSrc:  Oral  Axillary  SpO2: 98%   100%  Weight:   63.3 kg   Height:   5\' 4"  (1.626 m)     Constitutional: NAD, calm, comfortable Vitals:   10/08/21 2115 10/08/21 2128 10/08/21 2159 10/08/21 2200  BP: 124/68   109/63  Pulse: 87   87  Resp: 16   18  Temp:  97.7 F (36.5 C)  98.2 F (36.8 C)  TempSrc:  Oral  Axillary  SpO2: 98%   100%  Weight:   63.3 kg   Height:   5\' 4"  (1.626 m)    General: WDWN, Alert and oriented to person and place.  Eyes: EOMI, PERRL, lconjunctivae normal.  Sclera nonicteric HENT:  [place]/AT, external ears normal.  Nares patent without epistasis.  Mucous membranes are dry. Posterior pharynx clear  Neck: Soft, normal range of motion, supple, no masses,  Trachea midline Respiratory: clear to auscultation bilaterally, no wheezing, no crackles. Normal respiratory effort. No accessory muscle use.  Cardiovascular: Regular rate and rhythm, no murmurs / rubs / gallops. No extremity edema. 2+ pedal pulses. .  Abdomen: Soft, no tenderness, nondistended, no rebound or guarding.  No masses palpated. Bowel sounds normoactive Musculoskeletal: FROM. no cyanosis. No joint deformity upper and lower extremities. Normal muscle tone.  Skin: Warm, dry, intact no rashes, lesions, ulcers. No induration Neurologic: CN 2-12 grossly intact.  Normal speech. patella DTR +1 bilaterally. Strength 4/5 in all extremities.   Psychiatric: Normal mood.    Labs on Admission: I have personally reviewed following labs and imaging studies  CBC: Recent  Labs  Lab 10/08/21 1501 10/08/21 1702  WBC 11.4* 11.5*  NEUTROABS 9.6* 9.8*  HGB 15.8* 16.1*  HCT 45.2 45.2  MCV 88.5 87.6  PLT 288 710    Basic Metabolic Panel: Recent Labs  Lab 10/08/21 1501 10/08/21 1702  NA 123* 124*  K 4.7 4.8  CL 87* 89*  CO2 23 21*  GLUCOSE 122* 116*  BUN 51* 47*  CREATININE 1.91* 1.93*  CALCIUM 10.6* 10.2    GFR: Estimated Creatinine Clearance: 16.7 mL/min (A) (by C-G formula based on SCr of 1.93 mg/dL (H)).  Liver Function Tests: Recent Labs  Lab 10/08/21 1501 10/08/21 1702  AST 19 27  ALT 36 36  ALKPHOS 71 76  BILITOT 0.7 0.8  PROT 7.2 6.7  ALBUMIN 4.3 4.2  Urine analysis: No results found for: COLORURINE, APPEARANCEUR, [place], [place], [place], [place], BILIRUBINUR, KETONESUR, PROTEINUR, [place], NITRITE, LEUKOCYTESUR  Radiological Exams on Admission: DG Chest 2 View  Result Date: 10/08/2021 CLINICAL DATA:  Shortness of breath EXAM: CHEST - 2 VIEW COMPARISON:  07/04/2021 FINDINGS: Cardiac size is within normal limits. Low position of diaphragms and increase in AP diameter of chest suggests COPD. There are no signs of alveolar pulmonary edema or focal pulmonary consolidation. There is haziness at left cardiophrenic angle which may suggest pleural adhesions. There is no pleural effusion or pneumothorax. IMPRESSION: COPD. There are no signs of pulmonary edema or focal pulmonary consolidation. Electronically Signed   By: Elmer Picker M.D.   On: 10/08/2021 16:57   CT Head Wo Contrast  Result Date: 10/08/2021 CLINICAL DATA:  Delirium confusion EXAM: CT HEAD WITHOUT CONTRAST TECHNIQUE: Contiguous axial images were obtained from the base of the skull through the vertex without intravenous contrast. COMPARISON:  Head CT 07/04/2021 FINDINGS: Brain: No acute territorial infarction, hemorrhage or intracranial mass. Moderate atrophy. Mild hypodensity in the white matter consistent with chronic small vessel ischemic change.  Nonenlarged ventricles Vascular: No hyperdense vessels.  Carotid vascular calcification Skull: Normal. Negative for fracture or focal lesion. Sinuses/Orbits: No acute finding. Other: None IMPRESSION: 1. No CT evidence for acute intracranial abnormality. 2. Atrophy and mild chronic small vessel ischemic changes of the white matter. Electronically Signed   By: Donavan Foil M.D.   On: 10/08/2021 19:03    EKG: Independently reviewed.  EKG shows normal sinus rhythm with right atrial enlargement.  No acute ST elevation or depression.  QTc 412  Assessment/Plan Principal Problem:   Hyponatremia Marilyn Hobbs with hyponatremia with sodium of 123 initially improved to 124 after some IV fluids in the emergency room.  Patient is mildly dehydrated.  Osmolality is decreased to 272.  Urine sodium is pending. Patient is placed on telemetry floor for observation. IV fluid hydration with normal saline at 75 ml/hr overnight Check BMP every 6 hours, call MD if sodium rises more than 4 mEq/dl in that time frame as do not want to replace sodium too quickly.   Active Problems:   Acute kidney injury superimposed on CKD  Creatinine is 1.93 today from baseline of 1.0-1.1.  Recheck electrolytes and renal function in morning after IV fluid hydration.    Essential hypertension Patient continued on lisinopril and Norvasc.  Monitor blood pressure.  HCTZ was held with hyponatremia    Dehydration IVF hydration overnight    COPD (chronic obstructive pulmonary disease)  Continue trilogy inhaler.  Incentive spirometer every 2 hours while awake.  Albuterol MDI with AeroChamber as needed for shortness of breath, cough, wheeze    Confusion Mild confusion.  Unknown if this is her baseline or if this is secondary to the hyponatremia.  We will reassess once sodium is corrected and can discussed with daughter whom she lives with    DVT prophylaxis: Heparin for DVT prophylaxis  Code Status:   Full code  Family Communication:  No  family at bedside and no answer when attempted to call daughter. Disposition Plan:   Patient is from:  Home  Anticipated DC to:  Home  Anticipated DC date:  Anticipate less than 2 midnights in hospital this may change depending on patient response to treatment   Admission status:  Observation   Eben Burow MD [facility]  How to contact the [facility] Attending or Consulting provider [place] or covering provider during after hours  7P -7A, for this patient?   Check the care team in [facility] and look for a) attending/consulting [facility] provider listed and b) the [facility] team listed Log into [email or web address] and use [place] universal password to access. If you do not have the password, please contact the hospital operator. Locate the [facility] provider you are looking for under [facility] and page to a number that you can be directly reached. If you still have difficulty reaching the provider, please page the [facility] (Director on Call) for the Hospitalists listed on amion for assistance.  10/08/2021, 10:39 PM

## 2021-10-09 ENCOUNTER — Encounter (HOSPITAL_COMMUNITY): Payer: Self-pay | Admitting: Internal Medicine

## 2021-10-09 DIAGNOSIS — K5900 Constipation, unspecified: Secondary | ICD-10-CM | POA: Diagnosis present

## 2021-10-09 DIAGNOSIS — R5381 Other malaise: Secondary | ICD-10-CM | POA: Diagnosis present

## 2021-10-09 DIAGNOSIS — E890 Postprocedural hypothyroidism: Secondary | ICD-10-CM | POA: Diagnosis present

## 2021-10-09 DIAGNOSIS — Z7951 Long term (current) use of inhaled steroids: Secondary | ICD-10-CM | POA: Diagnosis not present

## 2021-10-09 DIAGNOSIS — R410 Disorientation, unspecified: Secondary | ICD-10-CM

## 2021-10-09 DIAGNOSIS — N182 Chronic kidney disease, stage 2 (mild): Secondary | ICD-10-CM | POA: Diagnosis present

## 2021-10-09 DIAGNOSIS — G9341 Metabolic encephalopathy: Secondary | ICD-10-CM | POA: Diagnosis present

## 2021-10-09 DIAGNOSIS — J449 Chronic obstructive pulmonary disease, unspecified: Secondary | ICD-10-CM | POA: Diagnosis present

## 2021-10-09 DIAGNOSIS — E871 Hypo-osmolality and hyponatremia: Secondary | ICD-10-CM | POA: Diagnosis present

## 2021-10-09 DIAGNOSIS — N179 Acute kidney failure, unspecified: Secondary | ICD-10-CM | POA: Diagnosis present

## 2021-10-09 DIAGNOSIS — E860 Dehydration: Secondary | ICD-10-CM | POA: Diagnosis present

## 2021-10-09 DIAGNOSIS — D45 Polycythemia vera: Secondary | ICD-10-CM | POA: Diagnosis present

## 2021-10-09 DIAGNOSIS — Z79890 Hormone replacement therapy (postmenopausal): Secondary | ICD-10-CM | POA: Diagnosis not present

## 2021-10-09 DIAGNOSIS — E878 Other disorders of electrolyte and fluid balance, not elsewhere classified: Secondary | ICD-10-CM | POA: Diagnosis present

## 2021-10-09 DIAGNOSIS — Z79899 Other long term (current) drug therapy: Secondary | ICD-10-CM | POA: Diagnosis not present

## 2021-10-09 DIAGNOSIS — Z7982 Long term (current) use of aspirin: Secondary | ICD-10-CM | POA: Diagnosis not present

## 2021-10-09 DIAGNOSIS — I129 Hypertensive chronic kidney disease with stage 1 through stage 4 chronic kidney disease, or unspecified chronic kidney disease: Secondary | ICD-10-CM | POA: Diagnosis present

## 2021-10-09 DIAGNOSIS — Z20822 Contact with and (suspected) exposure to covid-19: Secondary | ICD-10-CM | POA: Diagnosis present

## 2021-10-09 DIAGNOSIS — E7800 Pure hypercholesterolemia, unspecified: Secondary | ICD-10-CM | POA: Diagnosis present

## 2021-10-09 LAB — BASIC METABOLIC PANEL
Anion gap: 9 (ref 5–15)
Anion gap: 9 (ref 5–15)
Anion gap: 8 (ref 5–15)
BUN: 38 mg/dL — ABNORMAL HIGH (ref 8–23)
BUN: 38 mg/dL — ABNORMAL HIGH (ref 8–23)
BUN: 37 mg/dL — ABNORMAL HIGH (ref 8–23)
CO2: 19 mmol/L — ABNORMAL LOW (ref 22–32)
CO2: 22 mmol/L (ref 22–32)
CO2: 22 mmol/L (ref 22–32)
Calcium: 9 mg/dL (ref 8.9–10.3)
Calcium: 8.9 mg/dL (ref 8.9–10.3)
Calcium: 8.8 mg/dL — ABNORMAL LOW (ref 8.9–10.3)
Chloride: 97 mmol/L — ABNORMAL LOW (ref 98–111)
Chloride: 97 mmol/L — ABNORMAL LOW (ref 98–111)
Chloride: 95 mmol/L — ABNORMAL LOW (ref 98–111)
Creatinine, Ser: 1.43 mg/dL — ABNORMAL HIGH (ref 0.44–1.00)
Creatinine, Ser: 1.43 mg/dL — ABNORMAL HIGH (ref 0.44–1.00)
Creatinine, Ser: 1.33 mg/dL — ABNORMAL HIGH (ref 0.44–1.00)
GFR, Estimated: 35 mL/min — ABNORMAL LOW (ref >60)
GFR, Estimated: 35 mL/min — ABNORMAL LOW (ref >60)
GFR, Estimated: 38 mL/min — ABNORMAL LOW (ref >60)
Glucose, Bld: 77 mg/dL (ref 70–99)
Glucose, Bld: 71 mg/dL (ref 70–99)
Glucose, Bld: 92 mg/dL (ref 70–99)
Potassium: 4.7 mmol/L (ref 3.5–5.1)
Potassium: 4.2 mmol/L (ref 3.5–5.1)
Potassium: 4.4 mmol/L (ref 3.5–5.1)
Sodium: 125 mmol/L — ABNORMAL LOW (ref 135–145)
Sodium: 128 mmol/L — ABNORMAL LOW (ref 135–145)
Sodium: 125 mmol/L — ABNORMAL LOW (ref 135–145)

## 2021-10-09 LAB — CBC
HCT: 41.2 % (ref 36.0–46.0)
Hemoglobin: 14.4 g/dL (ref 12.0–15.0)
MCH: 30.8 pg (ref 26.0–34.0)
MCHC: 35 g/dL (ref 30.0–36.0)
MCV: 88.2 fL (ref 80.0–100.0)
Platelets: 145 10*3/uL — ABNORMAL LOW (ref 150–400)
RBC: 4.67 MIL/uL (ref 3.87–5.11)
RDW: 12.9 % (ref 11.5–15.5)
WBC: 7.9 10*3/uL (ref 4.0–10.5)
nRBC: 0 % (ref 0.0–0.2)

## 2021-10-09 LAB — URINALYSIS, ROUTINE W REFLEX MICROSCOPIC
Bilirubin Urine: NEGATIVE
Glucose, UA: NEGATIVE mg/dL
Hgb urine dipstick: NEGATIVE
Ketones, ur: NEGATIVE mg/dL
Leukocytes,Ua: NEGATIVE
Nitrite: NEGATIVE
Protein, ur: NEGATIVE mg/dL
Specific Gravity, Urine: 1.01 (ref 1.005–1.030)
pH: 6 (ref 5.0–8.0)

## 2021-10-09 LAB — OSMOLALITY, URINE: Osmolality, Ur: 361 mOsm/kg (ref 300–900)

## 2021-10-09 LAB — IRON AND TIBC
Iron: 97 ug/dL (ref 41–142)
Saturation Ratios: 26 % (ref 21–57)
TIBC: 370 ug/dL (ref 236–444)
UIBC: 273 ug/dL (ref 120–384)

## 2021-10-09 LAB — FERRITIN: Ferritin: 221 ng/mL (ref 11–307)

## 2021-10-09 LAB — NA AND K (SODIUM & POTASSIUM), RAND UR
Potassium Urine: 25 mmol/L
Sodium, Ur: 48 mmol/L

## 2021-10-09 LAB — SODIUM, URINE, RANDOM: Sodium, Ur: 48 mmol/L

## 2021-10-09 MED ORDER — POLYETHYLENE GLYCOL 3350 17 G PO PACK: 17.0000 g | PACK | Freq: Every day | ORAL | Status: DC | PRN

## 2021-10-09 MED ORDER — UMECLIDINIUM BROMIDE 62.5 MCG/ACT IN AEPB
1.0000 | INHALATION_SPRAY | Freq: Every day | RESPIRATORY_TRACT | Status: DC
  Administered 2021-10-09 – 2021-10-13 (×5): 1 via RESPIRATORY_TRACT
  Filled 2021-10-09: qty 7

## 2021-10-09 MED ORDER — FLUTICASONE FUROATE-VILANTEROL 200-25 MCG/ACT IN AEPB
1.0000 | INHALATION_SPRAY | Freq: Every day | RESPIRATORY_TRACT | Status: DC
  Administered 2021-10-09 – 2021-10-13 (×5): 1 via RESPIRATORY_TRACT
  Filled 2021-10-09: qty 28

## 2021-10-09 NOTE — Progress Notes (Signed)
PROGRESS NOTE    Marilyn Hobbs  ZOX:[id number] DOB: Sep 11, 1929 DOA: 10/08/2021 PCP: Marilyn Lima, MD    Brief Narrative:  87 y.o. female with medical history significant for polycythemia vera, HTN, hypothyroidism, COPD, HLD who presents from [facility] ER for low sodium and dehydration. Marilyn Hobbs is confused and is unsure of why she was sent to the hospital.  Daughter who she lives with is not at the bedside.  Per report patient was seen by her oncologist today for follow-up and felt to be dehydrated and found of low sodium was sent to the emergency room.  Was given IV fluid hydration and sodium increased mildly from 123-124 was then sent to [facility] for observation and further treatment.  There is no report of any abdominal pain, nausea, vomiting, diarrhea.  It is unclear at this time what her p.o. intake is been the last few days.  Assessment & Plan:   Principal Problem:   Hyponatremia Active Problems:   Essential hypertension   Acute kidney injury superimposed on CKD (HCC)   Dehydration   Confusion   COPD (chronic obstructive pulmonary disease) (HCC)  Principal Problem:   Hyponatremia -Suspecting hypovolemic hyponatremia given clinical dehydration at time of presentation -Sodium has improved to 125 with hydration overnight -Urine osmolality and urine sodium remain pending -Continue to follow sodium trends -We will continue IV fluid hydration for now   Active Problems:   Acute kidney injury superimposed on CKD  Baseline of 1.0-1.1 with presenting Cr of 1.93, improved to 1.43 Recheck basic metabolic panel in the morning     Essential hypertension -Currently normotensive -Patient is continued on lisinopril and Norvasc -Hydrochlorothiazide held at time of presentation secondary to hyponatremia, would continue to hold at time of discharge     Dehydration Given IV fluid hydration, encourage p.o. intake as tolerated     COPD (chronic obstructive pulmonary disease)  Continue  trilogy inhaler.  Incentive spirometer every 2 hours while awake.   Continue albuterol nebs as needed This morning, patient complained of increased shortness of breath On minimal O2 support     Confusion -Reported mild confusion at time of presentation -This morning, patient's mentation seems appropriate, conversing appropriately -Continue to treat hyponatremia per above -Seems to be improving   DVT prophylaxis: Heparin subq Code Status: Full Family Communication: Pt in room, family not at bedside  Status is: Observation  The patient remains OBS appropriate and will d/c before 2 midnights.      Consultants:    Procedures:    Antimicrobials: Anti-infectives (From admission, onward)    None       Subjective: Complaining of increased sob this AM and constipation  Objective: Vitals:   10/09/21 0514 10/09/21 1006 10/09/21 1028 10/09/21 1247  BP: (!) 107/55 117/61  109/60  Pulse: 79 85  93  Resp: 18 19  17   Temp: 97.8 F (36.6 C) 98 F (36.7 C)  97.6 F (36.4 C)  TempSrc: Oral Oral  Oral  SpO2: 100% 100% 99% 100%  Weight:      Height:        Intake/Output Summary (Last 24 hours) at 10/09/2021 1341 Last data filed at 10/09/2021 0900 Gross per 24 hour  Intake 505.1 ml  Output 100 ml  Net 405.1 ml   Filed Weights   10/08/21 1622 10/08/21 2159  Weight: 48.1 kg 63.3 kg    Examination: General exam: Awake, laying in bed, in nad Respiratory system: Normal respiratory effort, no wheezing Cardiovascular system: regular  rate, s1, s2 Gastrointestinal system: Soft, nondistended, positive BS Central nervous system: CN2-12 grossly intact, strength intact Extremities: Perfused, no clubbing Skin: Normal skin turgor, no notable skin lesions seen Psychiatry: Mood normal // no visual hallucinations   Data Reviewed: I have personally reviewed following labs and imaging studies  CBC: Recent Labs  Lab 10/08/21 1501 10/08/21 1702 10/09/21 0448  WBC 11.4* 11.5*  7.9  NEUTROABS 9.6* 9.8*  --   HGB 15.8* 16.1* 14.4  HCT 45.2 45.2 41.2  MCV 88.5 87.6 88.2  PLT 288 306 564*   Basic Metabolic Panel: Recent Labs  Lab 10/08/21 1501 10/08/21 1702 10/08/21 2306 10/09/21 0448 10/09/21 1042  NA 123* 124* 125* 125* 128*  K 4.7 4.8 4.5 4.7 4.2  CL 87* 89* 93* 97* 97*  CO2 23 21* 20* 19* 22  GLUCOSE 122* 116* 107* 77 71  BUN 51* 47* 46* 38* 38*  CREATININE 1.91* 1.93* 1.67* 1.43* 1.43*  CALCIUM 10.6* 10.2 9.1 9.0 8.9   GFR: Estimated Creatinine Clearance: 22.6 mL/min (A) (by C-G formula based on SCr of 1.43 mg/dL (H)). Liver Function Tests: Recent Labs  Lab 10/08/21 1501 10/08/21 1702  AST 19 27  ALT 36 36  ALKPHOS 71 76  BILITOT 0.7 0.8  PROT 7.2 6.7  ALBUMIN 4.3 4.2   No results for input(s): LIPASE, AMYLASE in the last 168 hours. No results for input(s): AMMONIA in the last 168 hours. Coagulation Profile: No results for input(s): INR, PROTIME in the last 168 hours. Cardiac Enzymes: No results for input(s): CKTOTAL, CKMB, CKMBINDEX, TROPONINI in the last 168 hours. BNP (last 3 results) No results for input(s): PROBNP in the last 8760 hours. HbA1C: No results for input(s): HGBA1C in the last 72 hours. CBG: No results for input(s): GLUCAP in the last 168 hours. Lipid Profile: No results for input(s): CHOL, HDL, LDLCALC, TRIG, CHOLHDL, LDLDIRECT in the last 72 hours. Thyroid Function Tests: No results for input(s): TSH, T4TOTAL, FREET4, T3FREE, THYROIDAB in the last 72 hours. Anemia Panel: Recent Labs    10/08/21 1502  FERRITIN 221  TIBC 370  IRON 97  RETICCTPCT 1.8   Sepsis Labs: No results for input(s): PROCALCITON, LATICACIDVEN in the last 168 hours.  Recent Results (from the past 240 hour(s))  Resp Panel by RT-PCR (Flu A&B, Covid) Nasopharyngeal Swab     Status: None   Collection Time: 10/08/21  5:12 PM   Specimen: Nasopharyngeal Swab; Nasopharyngeal(NP) swabs in vial transport medium  Result Value Ref Range Status    SARS Coronavirus 2 by RT PCR NEGATIVE NEGATIVE Final    Comment: (NOTE) SARS-CoV-2 target nucleic acids are NOT DETECTED.  The SARS-CoV-2 RNA is generally detectable in upper respiratory specimens during the acute phase of infection. The lowest concentration of SARS-CoV-2 viral copies this assay can detect is 138 copies/mL. A negative result does not preclude SARS-Cov-2 infection and should not be used as the sole basis for treatment or other patient management decisions. A negative result may occur with  improper specimen collection/handling, submission of specimen other than nasopharyngeal swab, presence of viral mutation(s) within the areas targeted by this assay, and inadequate number of viral copies(<138 copies/mL). A negative result must be combined with clinical observations, patient history, and epidemiological information. The expected result is Negative.  Fact Sheet for Patients:  EntrepreneurPulse.com.au  Fact Sheet for Healthcare Providers:  IncredibleEmployment.be  This test is no t yet approved or cleared by the [place] FDA and  has been authorized for detection  and/or diagnosis of SARS-CoV-2 by FDA under an Emergency Use Authorization (EUA). This EUA will remain  in effect (meaning this test can be used) for the duration of the COVID-19 declaration under Section 564(b)(1) of the Act, 21 U.S.C.section 360bbb-3(b)(1), unless the authorization is terminated  or revoked sooner.       Influenza A by PCR NEGATIVE NEGATIVE Final   Influenza B by PCR NEGATIVE NEGATIVE Final    Comment: (NOTE) The Xpert Xpress SARS-CoV-2/FLU/RSV plus assay is intended as an aid in the diagnosis of influenza from Nasopharyngeal swab specimens and should not be used as a sole basis for treatment. Nasal washings and aspirates are unacceptable for Xpert Xpress SARS-CoV-2/FLU/RSV testing.  Fact Sheet for  Patients: EntrepreneurPulse.com.au  Fact Sheet for Healthcare Providers: IncredibleEmployment.be  This test is not yet approved or cleared by the [place] FDA and has been authorized for detection and/or diagnosis of SARS-CoV-2 by FDA under an Emergency Use Authorization (EUA). This EUA will remain in effect (meaning this test can be used) for the duration of the COVID-19 declaration under Section 564(b)(1) of the Act, 21 U.S.C. section 360bbb-3(b)(1), unless the authorization is terminated or revoked.  Performed at [facility], [address] [place], [place] [address]      Radiology Studies: DG Chest 2 View  Result Date: 10/08/2021 CLINICAL DATA:  Shortness of breath EXAM: CHEST - 2 VIEW COMPARISON:  07/04/2021 FINDINGS: Cardiac size is within normal limits. Low position of diaphragms and increase in AP diameter of chest suggests COPD. There are no signs of alveolar pulmonary edema or focal pulmonary consolidation. There is haziness at left cardiophrenic angle which may suggest pleural adhesions. There is no pleural effusion or pneumothorax. IMPRESSION: COPD. There are no signs of pulmonary edema or focal pulmonary consolidation. Electronically Signed   By: Elmer Picker M.D.   On: 10/08/2021 16:57   CT Head Wo Contrast  Result Date: 10/08/2021 CLINICAL DATA:  Delirium confusion EXAM: CT HEAD WITHOUT CONTRAST TECHNIQUE: Contiguous axial images were obtained from the base of the skull through the vertex without intravenous contrast. COMPARISON:  Head CT 07/04/2021 FINDINGS: Brain: No acute territorial infarction, hemorrhage or intracranial mass. Moderate atrophy. Mild hypodensity in the white matter consistent with chronic small vessel ischemic change. Nonenlarged ventricles Vascular: No hyperdense vessels.  Carotid vascular calcification Skull: Normal. Negative for fracture or focal lesion. Sinuses/Orbits: No acute finding.  Other: None IMPRESSION: 1. No CT evidence for acute intracranial abnormality. 2. Atrophy and mild chronic small vessel ischemic changes of the white matter. Electronically Signed   By: Donavan Foil M.D.   On: 10/08/2021 19:03    Scheduled Meds:  amLODipine  5 mg Oral Daily   fluticasone furoate-vilanterol  1 puff Inhalation Daily   And   umeclidinium bromide  1 puff Inhalation Daily   heparin  5,000 Units Subcutaneous Q8H   levothyroxine  25 mcg Oral Q0600   lisinopril  20 mg Oral Daily   rosuvastatin  10 mg Oral Daily   Continuous Infusions:  sodium chloride 75 mL/hr at 10/08/21 2323     LOS: 0 days   Marylu Lund, MD [facility] Pager On Amion  If 7PM-7AM, please contact night-coverage 10/09/2021, 1:41 PM

## 2021-10-09 NOTE — Plan of Care (Signed)

## 2021-10-10 LAB — BASIC METABOLIC PANEL
Anion gap: 8 (ref 5–15)
BUN: 21 mg/dL (ref 8–23)
CO2: 21 mmol/L — ABNORMAL LOW (ref 22–32)
Calcium: 8.6 mg/dL — ABNORMAL LOW (ref 8.9–10.3)
Chloride: 100 mmol/L (ref 98–111)
Creatinine, Ser: 0.88 mg/dL (ref 0.44–1.00)
GFR, Estimated: >60 mL/min (ref >60)
Glucose, Bld: 87 mg/dL (ref 70–99)
Potassium: 4.3 mmol/L (ref 3.5–5.1)
Sodium: 129 mmol/L — ABNORMAL LOW (ref 135–145)

## 2021-10-10 LAB — TSH: TSH: 4.198 u[IU]/mL (ref 0.350–4.500)

## 2021-10-10 NOTE — Evaluation (Signed)
Occupational Therapy Evaluation Patient Details Name: Marilyn Hobbs MRN: [id number] DOB: 18-Apr-1929 Today's Date: 10/10/2021   History of Present Illness Pt is 87 yo female who presented with  low sodium and dehydration. PMH significant for polycythemia vera, HTN, hypothyroidism, COPD, HLD   Clinical Impression   Pt admitted with the above diagnoses and presents with below problem list. Pt will benefit from continued acute OT to address the below listed deficits and maximize independence with basic ADLs prior to d/c to venue below. PTA pt was living with daughter per chart review. Unclear PLOF as pt is not able to provide reliable history and no family present. Currently, pt needs min A with UB ADLs, mod A with LB ADLs. Pt able to stand 2x and take 2 sidesteps before fatiguing onto EOB. Cued for seated lateral scoots towards HOB prior to returning to supine.        Recommendations for follow up therapy are one component of a multi-disciplinary discharge planning process, led by the attending physician.  Recommendations may be updated based on patient status, additional functional criteria and insurance authorization.   Follow Up Recommendations  Skilled nursing-short term rehab (<3 hours/day)    Assistance Recommended at Discharge Frequent or constant Supervision/Assistance  Functional Status Assessment  Patient has had a recent decline in their functional status and demonstrates the ability to make significant improvements in function in a reasonable and predictable amount of time.  Equipment Recommendations  Other (comment) (defer to next venue)    Recommendations for Other Services PT consult     Precautions / Restrictions Precautions Precautions: Fall Restrictions Weight Bearing Restrictions: No      Mobility Bed Mobility Overal bed mobility: Needs Assistance Bed Mobility: Sit to Supine       Sit to supine: Min assist   General bed mobility comments: assist to control  descent and bring BLE fully onto bed    Transfers Overall transfer level: Needs assistance Equipment used: Straight cane Transfers: Sit to/from Stand Sit to Stand: Mod assist           General transfer comment: to/from EOB. assist to powerup, steady, and control descent. Poor activity tolerance      Balance Overall balance assessment: Needs assistance Sitting-balance support: Feet supported;Single extremity supported Sitting balance-Leahy Scale: Fair     Standing balance support: Single extremity supported;Reliant on assistive device for balance Standing balance-Leahy Scale: Poor Standing balance comment: cane and min A for static standing about 20 seconds                           ADL either performed or assessed with clinical judgement   ADL Overall ADL's : Needs assistance/impaired Eating/Feeding: Set up;Sitting   Grooming: Moderate assistance;Sitting   Upper Body Bathing: Sitting;Minimal assistance   Lower Body Bathing: Moderate assistance;Sit to/from stand;Maximal assistance   Upper Body Dressing : Minimal assistance;Sitting   Lower Body Dressing: Moderate assistance               Functional mobility during ADLs: Moderate assistance;Cane (2 side steps then fatigued into sitting) General ADL Comments: Pt received EOB finishing breakfast, c/o lightheadedness. Orthostatic vitals in sit and stand collected. Pt able to take 2 side steps before fatiguing into sitting. lateral scoots towards HOB prior to return to supine     Vision         Perception     Praxis      Pertinent Vitals/Pain Pain Assessment: Faces  Faces Pain Scale: Hurts little more Pain Location: unspecified Pain Descriptors / Indicators: Discomfort Pain Intervention(s): Monitored during session     Hand Dominance     Extremity/Trunk Assessment Upper Extremity Assessment Upper Extremity Assessment: Generalized weakness   Lower Extremity Assessment Lower Extremity  Assessment: Defer to PT evaluation       Communication Communication Communication: Other (comment) (confused, slow processing)   Cognition Arousal/Alertness: Awake/alert Behavior During Therapy: Flat affect;Anxious Overall Cognitive Status: Impaired/Different from baseline Area of Impairment: Orientation;Attention;Memory;Following commands;Safety/judgement;Awareness;Problem solving                 Orientation Level: Situation Current Attention Level: Focused Memory: Decreased recall of precautions;Decreased short-term memory Following Commands: Follows one step commands inconsistently Safety/Judgement: Decreased awareness of safety;Decreased awareness of deficits Awareness: Intellectual Problem Solving: Slow processing;Decreased initiation;Difficulty sequencing;Requires verbal cues;Requires tactile cues General Comments: verbal perseveration noted. Pt made comments about feeling confused. Appears internally distracted     General Comments       Exercises     Shoulder Instructions      Home Living Family/patient expects to be discharged to:: Skilled nursing facility Living Arrangements: Children (per chart review lives with daughter)                                      Prior Functioning/Environment Prior Level of Function : Patient poor historian/Family not available                        OT Problem List: Decreased strength;Decreased activity tolerance;Impaired balance (sitting and/or standing);Decreased cognition;Decreased safety awareness;Decreased knowledge of use of DME or AE;Decreased knowledge of precautions;Pain;Cardiopulmonary status limiting activity      OT Treatment/Interventions: Self-care/ADL training;Therapeutic exercise;Energy conservation;DME and/or AE instruction;Patient/family education;Balance training;Therapeutic activities    OT Goals(Current goals can be found in the care plan section) Acute Rehab OT Goals Patient  Stated Goal: not stated OT Goal Formulation: With patient Time For Goal Achievement: 10/24/21 Potential to Achieve Goals: Fair ADL Goals Pt Will Perform Grooming: with min assist;sitting Pt Will Perform Upper Body Dressing: with set-up;sitting Pt Will Perform Lower Body Dressing: sit to/from stand;with min assist Pt Will Transfer to Toilet: with min assist;ambulating Pt Will Perform Toileting - Clothing Manipulation and hygiene: with min assist;sitting/lateral leans;sit to/from stand Additional ADL Goal #1: Pt will complete bed mobility at min A level to prepare for EOB/OOB ADLs  OT Frequency: Min 2X/week   Barriers to D/C:            Co-evaluation              AM-PAC OT "6 Clicks" Daily Activity     Outcome Measure Help from another person eating meals?: A Little Help from another person taking care of personal grooming?: A Little Help from another person toileting, which includes using toliet, bedpan, or urinal?: Total Help from another person bathing (including washing, rinsing, drying)?: A Lot Help from another person to put on and taking off regular upper body clothing?: A Little Help from another person to put on and taking off regular lower body clothing?: A Lot 6 Click Score: 14   End of Session Equipment Utilized During Treatment: Rolling walker (2 wheels) Nurse Communication: Mobility status;Other (comment) (+orthostatics)  Activity Tolerance: Patient limited by fatigue;Other (comment) (lightheaded) Patient left: in bed;with call bell/phone within reach;with bed alarm set  OT Visit Diagnosis: Unsteadiness on feet (R26.81);Muscle  weakness (generalized) (M62.81);Pain;Other symptoms and signs involving cognitive function                Time: 5747-3403 OT Time Calculation (min): 22 min Charges:  OT General Charges $OT Visit: 1 Visit OT Evaluation $OT Eval Low Complexity: Hagerman, OT Acute Rehabilitation Services Pager: [phone] Office:  [phone]   Hortencia Pilar 10/10/2021, 12:11 PM

## 2021-10-10 NOTE — Progress Notes (Addendum)
PROGRESS NOTE    Marilyn Hobbs  KZL:[id number] DOB: 10/19/1929 DOA: 10/08/2021 PCP: Janith Lima, MD    Brief Narrative:  87 y.o. female with medical history significant for polycythemia vera, HTN, hypothyroidism, COPD, HLD who presents from [facility] ER for low sodium and dehydration. Marilyn Hobbs is confused and is unsure of why she was sent to the hospital.  Daughter who she lives with is not at the bedside.  Per report patient was seen by her oncologist today for follow-up and felt to be dehydrated and found of low sodium was sent to the emergency room.  Was given IV fluid hydration and sodium increased mildly from 123-124 was then sent to [facility] for observation and further treatment.  There is no report of any abdominal pain, nausea, vomiting, diarrhea.  It is unclear at this time what her p.o. intake is been the last few days.  Assessment & Plan:   Principal Problem:   Hyponatremia Active Problems:   Essential hypertension   Acute kidney injury superimposed on CKD (HCC)   Dehydration   Confusion   COPD (chronic obstructive pulmonary disease) (HCC)  Principal Problem:   Hyponatremia -Suspecting hypovolemic hyponatremia given clinical dehydration at time of presentation. No recent vomiting or diarrhea but daughter says she doesn't drink regularly -Sodium 123 on presentation, baseline appears to be low to mid 130s - cxr w/o mass -Urine osmolality and urine sodium remain pending -Continue to follow sodium trends -We will continue IV fluid hydration for now  Debility OT advising snf, pt consult pending. Patient inclined not to pursue snf, daughter says her decision, they will discuss   Active Problems:   Acute kidney injury superimposed on CKD  Baseline of 1.0-1.1 with presenting Cr of 1.93, improved to 1.43 Recheck basic metabolic panel pending. Will hold lisinopril as well     Essential hypertension -Currently normotensive -Patient is continued on lisinopril and  Norvasc -Hydrochlorothiazide held at time of presentation secondary to hyponatremia, would continue to hold at time of discharge - holding lisinopril 2/2 aki     Dehydration Given IV fluid hydration, encourage p.o. intake as tolerated     COPD (chronic obstructive pulmonary disease)  Continue trilogy inhaler.  Incentive spirometer every 2 hours while awake.   Continue albuterol nebs as needed This morning, patient complained of increased shortness of breath On minimal O2 support     Confusion -Reported mild confusion at time of presentation -This morning, patient's mentation seems appropriate, conversing appropriately -Continue to treat hyponatremia per above -Seems to be improving   DVT prophylaxis: Heparin subq Code Status: Full Family Communication: daughter updated telephonically 11/23  Status is: Observation  The patient remains OBS appropriate and will d/c before 2 midnights.      Consultants:    Procedures:    Antimicrobials: Anti-infectives (From admission, onward)    None       Subjective: No complaints, no chest pain or cough. No n/v/d.   Objective: Vitals:   10/09/21 1247 10/09/21 2205 10/10/21 0459 10/10/21 0836  BP: 109/60 110/74 125/63   Pulse: 93 91 79   Resp: 17 18 18    Temp: 97.6 F (36.4 C) 97.7 F (36.5 C) 97.6 F (36.4 C)   TempSrc: Oral Oral Oral   SpO2: 100% 100% 100% 99%  Weight:      Height:        Intake/Output Summary (Last 24 hours) at 10/10/2021 1248 Last data filed at 10/09/2021 2200 Gross per 24 hour  Intake 240  ml  Output --  Net 240 ml   Filed Weights   10/08/21 1622 10/08/21 2159  Weight: 48.1 kg 63.3 kg    Examination: General exam: Awake, laying in bed, in nad Respiratory system: Normal respiratory effort, no wheezing Cardiovascular system: regular rate, s1, s2 Gastrointestinal system: Soft, nondistended, positive BS Central nervous system: CN2-12 grossly intact, strength intact Extremities: Perfused,  no clubbing Skin: Normal skin turgor, no notable skin lesions seen Psychiatry: Mood normal // no visual hallucinations   Data Reviewed: I have personally reviewed following labs and imaging studies  CBC: Recent Labs  Lab 10/08/21 1501 10/08/21 1702 10/09/21 0448  WBC 11.4* 11.5* 7.9  NEUTROABS 9.6* 9.8*  --   HGB 15.8* 16.1* 14.4  HCT 45.2 45.2 41.2  MCV 88.5 87.6 88.2  PLT 288 306 004*   Basic Metabolic Panel: Recent Labs  Lab 10/08/21 1702 10/08/21 2306 10/09/21 0448 10/09/21 1042 10/09/21 1631  NA 124* 125* 125* 128* 125*  K 4.8 4.5 4.7 4.2 4.4  CL 89* 93* 97* 97* 95*  CO2 21* 20* 19* 22 22  GLUCOSE 116* 107* 77 71 92  BUN 47* 46* 38* 38* 37*  CREATININE 1.93* 1.67* 1.43* 1.43* 1.33*  CALCIUM 10.2 9.1 9.0 8.9 8.8*   GFR: Estimated Creatinine Clearance: 24.3 mL/min (A) (by C-G formula based on SCr of 1.33 mg/dL (H)). Liver Function Tests: Recent Labs  Lab 10/08/21 1501 10/08/21 1702  AST 19 27  ALT 36 36  ALKPHOS 71 76  BILITOT 0.7 0.8  PROT 7.2 6.7  ALBUMIN 4.3 4.2   No results for input(s): LIPASE, AMYLASE in the last 168 hours. No results for input(s): AMMONIA in the last 168 hours. Coagulation Profile: No results for input(s): INR, PROTIME in the last 168 hours. Cardiac Enzymes: No results for input(s): CKTOTAL, CKMB, CKMBINDEX, TROPONINI in the last 168 hours. BNP (last 3 results) No results for input(s): PROBNP in the last 8760 hours. HbA1C: No results for input(s): HGBA1C in the last 72 hours. CBG: No results for input(s): GLUCAP in the last 168 hours. Lipid Profile: No results for input(s): CHOL, HDL, LDLCALC, TRIG, CHOLHDL, LDLDIRECT in the last 72 hours. Thyroid Function Tests: No results for input(s): TSH, T4TOTAL, FREET4, T3FREE, THYROIDAB in the last 72 hours. Anemia Panel: Recent Labs    10/08/21 1502  FERRITIN 221  TIBC 370  IRON 97  RETICCTPCT 1.8   Sepsis Labs: No results for input(s): PROCALCITON, LATICACIDVEN in the last  168 hours.  Recent Results (from the past 240 hour(s))  Resp Panel by RT-PCR (Flu A&B, Covid) Nasopharyngeal Swab     Status: None   Collection Time: 10/08/21  5:12 PM   Specimen: Nasopharyngeal Swab; Nasopharyngeal(NP) swabs in vial transport medium  Result Value Ref Range Status   SARS Coronavirus 2 by RT PCR NEGATIVE NEGATIVE Final    Comment: (NOTE) SARS-CoV-2 target nucleic acids are NOT DETECTED.  The SARS-CoV-2 RNA is generally detectable in upper respiratory specimens during the acute phase of infection. The lowest concentration of SARS-CoV-2 viral copies this assay can detect is 138 copies/mL. A negative result does not preclude SARS-Cov-2 infection and should not be used as the sole basis for treatment or other patient management decisions. A negative result may occur with  improper specimen collection/handling, submission of specimen other than nasopharyngeal swab, presence of viral mutation(s) within the areas targeted by this assay, and inadequate number of viral copies(<138 copies/mL). A negative result must be combined with clinical observations, patient  history, and epidemiological information. The expected result is Negative.  Fact Sheet for Patients:  EntrepreneurPulse.com.au  Fact Sheet for Healthcare Providers:  IncredibleEmployment.be  This test is no t yet approved or cleared by the [place] FDA and  has been authorized for detection and/or diagnosis of SARS-CoV-2 by FDA under an Emergency Use Authorization (EUA). This EUA will remain  in effect (meaning this test can be used) for the duration of the COVID-19 declaration under Section 564(b)(1) of the Act, 21 U.S.C.section 360bbb-3(b)(1), unless the authorization is terminated  or revoked sooner.       Influenza A by PCR NEGATIVE NEGATIVE Final   Influenza B by PCR NEGATIVE NEGATIVE Final    Comment: (NOTE) The Xpert Xpress SARS-CoV-2/FLU/RSV plus assay is  intended as an aid in the diagnosis of influenza from Nasopharyngeal swab specimens and should not be used as a sole basis for treatment. Nasal washings and aspirates are unacceptable for Xpert Xpress SARS-CoV-2/FLU/RSV testing.  Fact Sheet for Patients: EntrepreneurPulse.com.au  Fact Sheet for Healthcare Providers: IncredibleEmployment.be  This test is not yet approved or cleared by the [place] FDA and has been authorized for detection and/or diagnosis of SARS-CoV-2 by FDA under an Emergency Use Authorization (EUA). This EUA will remain in effect (meaning this test can be used) for the duration of the COVID-19 declaration under Section 564(b)(1) of the Act, 21 U.S.C. section 360bbb-3(b)(1), unless the authorization is terminated or revoked.  Performed at [facility], [address], [place], [place] [address]      Radiology Studies: DG Chest 2 View  Result Date: 10/08/2021 CLINICAL DATA:  Shortness of breath EXAM: CHEST - 2 VIEW COMPARISON:  07/04/2021 FINDINGS: Cardiac size is within normal limits. Low position of diaphragms and increase in AP diameter of chest suggests COPD. There are no signs of alveolar pulmonary edema or focal pulmonary consolidation. There is haziness at left cardiophrenic angle which may suggest pleural adhesions. There is no pleural effusion or pneumothorax. IMPRESSION: COPD. There are no signs of pulmonary edema or focal pulmonary consolidation. Electronically Signed   By: Elmer Picker M.D.   On: 10/08/2021 16:57   CT Head Wo Contrast  Result Date: 10/08/2021 CLINICAL DATA:  Delirium confusion EXAM: CT HEAD WITHOUT CONTRAST TECHNIQUE: Contiguous axial images were obtained from the base of the skull through the vertex without intravenous contrast. COMPARISON:  Head CT 07/04/2021 FINDINGS: Brain: No acute territorial infarction, hemorrhage or intracranial mass. Moderate atrophy. Mild hypodensity in  the white matter consistent with chronic small vessel ischemic change. Nonenlarged ventricles Vascular: No hyperdense vessels.  Carotid vascular calcification Skull: Normal. Negative for fracture or focal lesion. Sinuses/Orbits: No acute finding. Other: None IMPRESSION: 1. No CT evidence for acute intracranial abnormality. 2. Atrophy and mild chronic small vessel ischemic changes of the white matter. Electronically Signed   By: Donavan Foil M.D.   On: 10/08/2021 19:03    Scheduled Meds:  amLODipine  5 mg Oral Daily   fluticasone furoate-vilanterol  1 puff Inhalation Daily   And   umeclidinium bromide  1 puff Inhalation Daily   heparin  5,000 Units Subcutaneous Q8H   levothyroxine  25 mcg Oral Q0600   lisinopril  20 mg Oral Daily   rosuvastatin  10 mg Oral Daily   Continuous Infusions:  sodium chloride 75 mL/hr at 10/10/21 0539     LOS: 1 day   Desma Maxim, MD [facility] Pager On Amion  If 7PM-7AM, please contact night-coverage 10/10/2021,  12:48 PM

## 2021-10-10 NOTE — Progress Notes (Signed)
PT Cancellation Note  Patient Details Name: Kylie Simmonds MRN: [id number] DOB: 08/15/1929   Cancelled Treatment:    Reason Eval/Treat Not Completed: Patient declined, ;Fatigue limiting ability to participate/   [place] Pager [phone] Office [phone]  Claretha Cooper 10/10/2021, 4:01 PM

## 2021-10-11 LAB — BASIC METABOLIC PANEL
Anion gap: 7 (ref 5–15)
BUN: 15 mg/dL (ref 8–23)
CO2: 21 mmol/L — ABNORMAL LOW (ref 22–32)
Calcium: 8.6 mg/dL — ABNORMAL LOW (ref 8.9–10.3)
Chloride: 103 mmol/L (ref 98–111)
Creatinine, Ser: 0.89 mg/dL (ref 0.44–1.00)
GFR, Estimated: >60 mL/min (ref >60)
Glucose, Bld: 80 mg/dL (ref 70–99)
Potassium: 4 mmol/L (ref 3.5–5.1)
Sodium: 131 mmol/L — ABNORMAL LOW (ref 135–145)

## 2021-10-11 MED ORDER — ENOXAPARIN SODIUM 40 MG/0.4ML IJ SOSY
40.0000 mg | PREFILLED_SYRINGE | INTRAMUSCULAR | Status: DC
  Administered 2021-10-12 – 2021-10-13 (×2): 40 mg via SUBCUTANEOUS
  Filled 2021-10-11 (×2): qty 0.4

## 2021-10-11 MED ORDER — ALPRAZOLAM 0.25 MG PO TABS
0.2500 mg | ORAL_TABLET | Freq: Once | ORAL | Status: AC
  Administered 2021-10-12: 0.25 mg via ORAL
  Filled 2021-10-11: qty 1

## 2021-10-11 NOTE — Progress Notes (Signed)
PROGRESS NOTE    Marilyn Hobbs  ZHY:[id number] DOB: 09-21-1929 DOA: 10/08/2021 PCP: Janith Lima, MD    Brief Narrative:  87 y.o. female with medical history significant for polycythemia vera, HTN, hypothyroidism, COPD, HLD who presents from [facility] ER for low sodium and dehydration. Marilyn Hobbs is confused and is unsure of why she was sent to the hospital.  Daughter who she lives with is not at the bedside.  Per report patient was seen by her oncologist today for follow-up and felt to be dehydrated and found of low sodium was sent to the emergency room.  Was given IV fluid hydration and sodium increased mildly from 123-124 was then sent to [facility] for observation and further treatment.  There is no report of any abdominal pain, nausea, vomiting, diarrhea.  It is unclear at this time what her p.o. intake is been the last few days.  Assessment & Plan:   Principal Problem:   Hyponatremia Active Problems:   Essential hypertension   Acute kidney injury superimposed on CKD (HCC)   Dehydration   Confusion   COPD (chronic obstructive pulmonary disease) (HCC)  Hyponatremia -Suspecting hypovolemic hyponatremia given clinical dehydration at time of presentation. No recent vomiting or diarrhea but daughter says she doesn't drink regularly. Is improving with IV hydration.  -Sodium 123 on presentation, baseline appears to be low to mid 130s. Is up to 131 today - cxr w/o mass -Continue to follow sodium trends -We will continue IV fluid hydration for now  Debility OT advising snf, pt consult pending. Patient inclined not to pursue snf, daughter says her decision, they will discuss      Acute kidney injury superimposed on CKD  Prerenal, resolved w/ IVF - monitor     Essential hypertension -Currently low normal bp -Patient is continued on lisinopril and Norvasc -Hydrochlorothiazide held at time of presentation secondary to hyponatremia, would continue to hold at time of discharge - holding  lisinopril 2/2 aki - will also hold norvasc     Dehydration Given IV fluid hydration, encourage p.o. intake as tolerated     COPD (chronic obstructive pulmonary disease)  Quiescent - cont home meds  Hypothyroid TSH wnl - cont home synthroid      Confusion -Reported mild confusion at time of presentation -This morning, patient's mentation seems appropriate, conversing appropriately -Continue to treat hyponatremia per above -from discussion w/ daughter appears to be at baseline   DVT prophylaxis: lovenox Code Status: Full Family Communication: daughter updated telephonically 11/23. No answer when called 11/24, message left.  Status is: inpt  The patient remains OBS appropriate and will d/c before 2 midnights.      Consultants:    Procedures:    Antimicrobials: Anti-infectives (From admission, onward)    None       Subjective: No complaints, no chest pain or cough. No n/v/d.   Objective: Vitals:   10/10/21 1547 10/10/21 1950 10/11/21 0347 10/11/21 1025  BP: 131/63 (!) 118/57 135/63 (!) 108/52  Pulse: 86 87 81 84  Resp:  20 20 20   Temp:  98 F (36.7 C) 98.6 F (37 C) 98.4 F (36.9 C)  TempSrc:  Oral Oral Axillary  SpO2: 97% 99% 99%   Weight:      Height:        Intake/Output Summary (Last 24 hours) at 10/11/2021 1036 Last data filed at 10/11/2021 0141 Gross per 24 hour  Intake 2390.8 ml  Output 350 ml  Net 2040.8 ml   [employer]  10/08/21 1622 10/08/21 2159  Weight: 48.1 kg 63.3 kg    Examination: General exam: Awake, laying in bed, in nad Respiratory system: Normal respiratory effort, no wheezing Cardiovascular system: regular rate, s1, s2 Gastrointestinal system: Soft, nondistended, positive BS Central nervous system: CN2-12 grossly intact, strength intact Extremities: Perfused, no clubbing Skin: Normal skin turgor, no notable skin lesions seen Psychiatry: Mood normal // no visual hallucinations   Data Reviewed: I have personally  reviewed following labs and imaging studies  CBC: Recent Labs  Lab 10/08/21 1501 10/08/21 1702 10/09/21 0448  WBC 11.4* 11.5* 7.9  NEUTROABS 9.6* 9.8*  --   HGB 15.8* 16.1* 14.4  HCT 45.2 45.2 41.2  MCV 88.5 87.6 88.2  PLT 288 306 865*   Basic Metabolic Panel: Recent Labs  Lab 10/09/21 0448 10/09/21 1042 10/09/21 1631 10/10/21 1431 10/11/21 0506  NA 125* 128* 125* 129* 131*  K 4.7 4.2 4.4 4.3 4.0  CL 97* 97* 95* 100 103  CO2 19* 22 22 21* 21*  GLUCOSE 77 71 92 87 80  BUN 38* 38* 37* 21 15  CREATININE 1.43* 1.43* 1.33* 0.88 0.89  CALCIUM 9.0 8.9 8.8* 8.6* 8.6*   GFR: Estimated Creatinine Clearance: 36.3 mL/min (by C-G formula based on SCr of 0.89 mg/dL). Liver Function Tests: Recent Labs  Lab 10/08/21 1501 10/08/21 1702  AST 19 27  ALT 36 36  ALKPHOS 71 76  BILITOT 0.7 0.8  PROT 7.2 6.7  ALBUMIN 4.3 4.2   No results for input(s): LIPASE, AMYLASE in the last 168 hours. No results for input(s): AMMONIA in the last 168 hours. Coagulation Profile: No results for input(s): INR, PROTIME in the last 168 hours. Cardiac Enzymes: No results for input(s): CKTOTAL, CKMB, CKMBINDEX, TROPONINI in the last 168 hours. BNP (last 3 results) No results for input(s): PROBNP in the last 8760 hours. HbA1C: No results for input(s): HGBA1C in the last 72 hours. CBG: No results for input(s): GLUCAP in the last 168 hours. Lipid Profile: No results for input(s): CHOL, HDL, LDLCALC, TRIG, CHOLHDL, LDLDIRECT in the last 72 hours. Thyroid Function Tests: Recent Labs    10/10/21 1431  TSH 4.198   Anemia Panel: Recent Labs    10/08/21 1502  FERRITIN 221  TIBC 370  IRON 97  RETICCTPCT 1.8   Sepsis Labs: No results for input(s): PROCALCITON, LATICACIDVEN in the last 168 hours.  Recent Results (from the past 240 hour(s))  Resp Panel by RT-PCR (Flu A&B, Covid) Nasopharyngeal Swab     Status: None   Collection Time: 10/08/21  5:12 PM   Specimen: Nasopharyngeal Swab;  Nasopharyngeal(NP) swabs in vial transport medium  Result Value Ref Range Status   SARS Coronavirus 2 by RT PCR NEGATIVE NEGATIVE Final    Comment: (NOTE) SARS-CoV-2 target nucleic acids are NOT DETECTED.  The SARS-CoV-2 RNA is generally detectable in upper respiratory specimens during the acute phase of infection. The lowest concentration of SARS-CoV-2 viral copies this assay can detect is 138 copies/mL. A negative result does not preclude SARS-Cov-2 infection and should not be used as the sole basis for treatment or other patient management decisions. A negative result may occur with  improper specimen collection/handling, submission of specimen other than nasopharyngeal swab, presence of viral mutation(s) within the areas targeted by this assay, and inadequate number of viral copies(<138 copies/mL). A negative result must be combined with clinical observations, patient history, and epidemiological information. The expected result is Negative.  Fact Sheet for Patients:  EntrepreneurPulse.com.au  Fact  Sheet for Healthcare Providers:  IncredibleEmployment.be  This test is no t yet approved or cleared by the [place] FDA and  has been authorized for detection and/or diagnosis of SARS-CoV-2 by FDA under an Emergency Use Authorization (EUA). This EUA will remain  in effect (meaning this test can be used) for the duration of the COVID-19 declaration under Section 564(b)(1) of the Act, 21 U.S.C.section 360bbb-3(b)(1), unless the authorization is terminated  or revoked sooner.       Influenza A by PCR NEGATIVE NEGATIVE Final   Influenza B by PCR NEGATIVE NEGATIVE Final    Comment: (NOTE) The Xpert Xpress SARS-CoV-2/FLU/RSV plus assay is intended as an aid in the diagnosis of influenza from Nasopharyngeal swab specimens and should not be used as a sole basis for treatment. Nasal washings and aspirates are unacceptable for Xpert Xpress  SARS-CoV-2/FLU/RSV testing.  Fact Sheet for Patients: EntrepreneurPulse.com.au  Fact Sheet for Healthcare Providers: IncredibleEmployment.be  This test is not yet approved or cleared by the [place] FDA and has been authorized for detection and/or diagnosis of SARS-CoV-2 by FDA under an Emergency Use Authorization (EUA). This EUA will remain in effect (meaning this test can be used) for the duration of the COVID-19 declaration under Section 564(b)(1) of the Act, 21 U.S.C. section 360bbb-3(b)(1), unless the authorization is terminated or revoked.  Performed at [facility], [address] [place], [place] [address]      Radiology Studies: No results found.  Scheduled Meds:  amLODipine  5 mg Oral Daily   fluticasone furoate-vilanterol  1 puff Inhalation Daily   And   umeclidinium bromide  1 puff Inhalation Daily   heparin  5,000 Units Subcutaneous Q8H   levothyroxine  25 mcg Oral Q0600   rosuvastatin  10 mg Oral Daily   Continuous Infusions:  sodium chloride 75 mL/hr at 10/11/21 0544     LOS: 2 days   Desma Maxim, MD [facility] Pager On Amion  If 7PM-7AM, please contact night-coverage 10/11/2021, 10:36 AM

## 2021-10-12 LAB — BASIC METABOLIC PANEL
Anion gap: 5 (ref 5–15)
BUN: 10 mg/dL (ref 8–23)
CO2: 22 mmol/L (ref 22–32)
Calcium: 8.7 mg/dL — ABNORMAL LOW (ref 8.9–10.3)
Chloride: 106 mmol/L (ref 98–111)
Creatinine, Ser: 0.82 mg/dL (ref 0.44–1.00)
GFR, Estimated: >60 mL/min (ref >60)
Glucose, Bld: 80 mg/dL (ref 70–99)
Potassium: 3.9 mmol/L (ref 3.5–5.1)
Sodium: 133 mmol/L — ABNORMAL LOW (ref 135–145)

## 2021-10-12 MED ORDER — ALPRAZOLAM 0.25 MG PO TABS
0.2500 mg | ORAL_TABLET | Freq: Once | ORAL | Status: AC
  Administered 2021-10-12: 0.25 mg via ORAL
  Filled 2021-10-12: qty 1

## 2021-10-12 MED ORDER — AMLODIPINE BESYLATE 5 MG PO TABS
5.0000 mg | ORAL_TABLET | Freq: Every day | ORAL | Status: DC
  Administered 2021-10-12 – 2021-10-13 (×2): 5 mg via ORAL
  Filled 2021-10-12 (×2): qty 1

## 2021-10-12 MED ORDER — MELATONIN 5 MG PO TABS
5.0000 mg | ORAL_TABLET | Freq: Once | ORAL | Status: AC
  Administered 2021-10-12: 5 mg via ORAL
  Filled 2021-10-12: qty 1

## 2021-10-12 NOTE — Progress Notes (Signed)
The patient is injury-free, afebrile, alert, and oriented X 3. The vital signs were within the baseline during this shift. She complained of mild SOB and appeared anxious. SOB improved with nebulizer treatment. Pt denies chest pain, nausea, vomiting, dizziness, signs or symptoms of bleeding or infection or acute changes during this shift. We will continue to monitor and work toward achieving the care plan goals.

## 2021-10-12 NOTE — Care Management Important Message (Signed)
Important Message  Patient Details IM Letter placed in Patients room. Name: Marilyn Hobbs MRN: [id number] Date of Birth: 1929/04/21   Medicare Important Message Given:  Yes     Kerin Salen 10/12/2021, 12:35 PM

## 2021-10-12 NOTE — Progress Notes (Signed)
PROGRESS NOTE    Marilyn Hobbs  PQD:[id number] DOB: 03-Oct-1929 DOA: 10/08/2021 PCP: Janith Lima, MD    Brief Narrative:  87 y.o. female with medical history significant for polycythemia vera, HTN, hypothyroidism, COPD, HLD who presents from [facility] ER for low sodium and dehydration. Marilyn Hobbs is confused and is unsure of why she was sent to the hospital.  Daughter who she lives with is not at the bedside.  Per report patient was seen by her oncologist today for follow-up and felt to be dehydrated and found of low sodium was sent to the emergency room.  Was given IV fluid hydration and sodium increased mildly from 123-124 was then sent to [facility] for observation and further treatment.  There is no report of any abdominal pain, nausea, vomiting, diarrhea.  It is unclear at this time what her p.o. intake is been the last few days.  Assessment & Plan:   Principal Problem:   Hyponatremia Active Problems:   Essential hypertension   Acute kidney injury superimposed on CKD (HCC)   Dehydration   Confusion   COPD (chronic obstructive pulmonary disease) (HCC)  Hyponatremia -Suspecting hypovolemic hyponatremia given clinical dehydration at time of presentation. No recent vomiting or diarrhea but daughter says she doesn't drink regularly. Is improving with IV hydration.  -Sodium 123 on presentation, baseline appears to be low to mid 130s. Is up to 133 today - cxr w/o mass -Continue to follow sodium trends -We will continue IV fluid hydration for now, likely discharge tomorrow  Debility PT advising HH PT will arrange at discharge     Acute kidney injury superimposed on CKD  Prerenal, resolved w/ IVF - monitor     Essential hypertension -Hydrochlorothiazide held at time of presentation secondary to hyponatremia, would continue to hold at time of discharge - holding lisinopril 2/2 aki - bp mild elevation today will re-start home amlodipine     COPD (chronic obstructive pulmonary  disease)  Quiescent - cont home meds  Hypothyroid TSH wnl - cont home synthroid      Confusion -Reported mild confusion at time of presentation -This morning, patient's mentation seems appropriate, conversing appropriately -Continue to treat hyponatremia per above -from discussion w/ daughter appears to be at baseline   DVT prophylaxis: lovenox Code Status: Full Family Communication: daughter updated telephonically 11/25  Status is: inpt  The patient remains OBS appropriate and will d/c before 2 midnights.      Consultants:    Procedures:    Antimicrobials: Anti-infectives (From admission, onward)    None       Subjective: No complaints, no chest pain or cough. No n/v/d.   Objective: Vitals:   10/11/21 2356 10/12/21 0527 10/12/21 0909 10/12/21 1313  BP:  139/71  (!) 143/66  Pulse:  77  81  Resp: 18 18  16   Temp:  (!) 97.3 F (36.3 C)  98.6 F (37 C)  TempSrc:  Oral    SpO2: 100% 97% 98% 100%  Weight:      Height:        Intake/Output Summary (Last 24 hours) at 10/12/2021 1351 Last data filed at 10/12/2021 0757 Gross per 24 hour  Intake 826 ml  Output --  Net 826 ml   [employer]   10/08/21 1622 10/08/21 2159  Weight: 48.1 kg 63.3 kg    Examination: General exam: Awake, laying in bed, in nad Respiratory system: Normal respiratory effort, no wheezing Cardiovascular system: regular rate, s1, s2 Gastrointestinal system: Soft, nondistended,  positive BS Central nervous system: moving all 4 extremities Extremities: Perfused, no clubbing Skin: Normal skin turgor, no notable skin lesions seen Psychiatry: Mood normal   Data Reviewed: I have personally reviewed following labs and imaging studies  CBC: Recent Labs  Lab 10/08/21 1501 10/08/21 1702 10/09/21 0448  WBC 11.4* 11.5* 7.9  NEUTROABS 9.6* 9.8*  --   HGB 15.8* 16.1* 14.4  HCT 45.2 45.2 41.2  MCV 88.5 87.6 88.2  PLT 288 306 627*   Basic Metabolic Panel: Recent Labs  Lab  10/09/21 1042 10/09/21 1631 10/10/21 1431 10/11/21 0506 10/12/21 0441  NA 128* 125* 129* 131* 133*  K 4.2 4.4 4.3 4.0 3.9  CL 97* 95* 100 103 106  CO2 22 22 21* 21* 22  GLUCOSE 71 92 87 80 80  BUN 38* 37* 21 15 10   CREATININE 1.43* 1.33* 0.88 0.89 0.82  CALCIUM 8.9 8.8* 8.6* 8.6* 8.7*   GFR: Estimated Creatinine Clearance: 39.4 mL/min (by C-G formula based on SCr of 0.82 mg/dL). Liver Function Tests: Recent Labs  Lab 10/08/21 1501 10/08/21 1702  AST 19 27  ALT 36 36  ALKPHOS 71 76  BILITOT 0.7 0.8  PROT 7.2 6.7  ALBUMIN 4.3 4.2   No results for input(s): LIPASE, AMYLASE in the last 168 hours. No results for input(s): AMMONIA in the last 168 hours. Coagulation Profile: No results for input(s): INR, PROTIME in the last 168 hours. Cardiac Enzymes: No results for input(s): CKTOTAL, CKMB, CKMBINDEX, TROPONINI in the last 168 hours. BNP (last 3 results) No results for input(s): PROBNP in the last 8760 hours. HbA1C: No results for input(s): HGBA1C in the last 72 hours. CBG: No results for input(s): GLUCAP in the last 168 hours. Lipid Profile: No results for input(s): CHOL, HDL, LDLCALC, TRIG, CHOLHDL, LDLDIRECT in the last 72 hours. Thyroid Function Tests: Recent Labs    10/10/21 1431  TSH 4.198   Anemia Panel: No results for input(s): VITAMINB12, FOLATE, FERRITIN, TIBC, IRON, RETICCTPCT in the last 72 hours.  Sepsis Labs: No results for input(s): PROCALCITON, LATICACIDVEN in the last 168 hours.  Recent Results (from the past 240 hour(s))  Resp Panel by RT-PCR (Flu A&B, Covid) Nasopharyngeal Swab     Status: None   Collection Time: 10/08/21  5:12 PM   Specimen: Nasopharyngeal Swab; Nasopharyngeal(NP) swabs in vial transport medium  Result Value Ref Range Status   SARS Coronavirus 2 by RT PCR NEGATIVE NEGATIVE Final    Comment: (NOTE) SARS-CoV-2 target nucleic acids are NOT DETECTED.  The SARS-CoV-2 RNA is generally detectable in upper respiratory specimens  during the acute phase of infection. The lowest concentration of SARS-CoV-2 viral copies this assay can detect is 138 copies/mL. A negative result does not preclude SARS-Cov-2 infection and should not be used as the sole basis for treatment or other patient management decisions. A negative result may occur with  improper specimen collection/handling, submission of specimen other than nasopharyngeal swab, presence of viral mutation(s) within the areas targeted by this assay, and inadequate number of viral copies(<138 copies/mL). A negative result must be combined with clinical observations, patient history, and epidemiological information. The expected result is Negative.  Fact Sheet for Patients:  EntrepreneurPulse.com.au  Fact Sheet for Healthcare Providers:  IncredibleEmployment.be  This test is no t yet approved or cleared by the [place] FDA and  has been authorized for detection and/or diagnosis of SARS-CoV-2 by FDA under an Emergency Use Authorization (EUA). This EUA will remain  in effect (meaning this  test can be used) for the duration of the COVID-19 declaration under Section 564(b)(1) of the Act, 21 U.S.C.section 360bbb-3(b)(1), unless the authorization is terminated  or revoked sooner.       Influenza A by PCR NEGATIVE NEGATIVE Final   Influenza B by PCR NEGATIVE NEGATIVE Final    Comment: (NOTE) The Xpert Xpress SARS-CoV-2/FLU/RSV plus assay is intended as an aid in the diagnosis of influenza from Nasopharyngeal swab specimens and should not be used as a sole basis for treatment. Nasal washings and aspirates are unacceptable for Xpert Xpress SARS-CoV-2/FLU/RSV testing.  Fact Sheet for Patients: EntrepreneurPulse.com.au  Fact Sheet for Healthcare Providers: IncredibleEmployment.be  This test is not yet approved or cleared by the [place] FDA and has been authorized for detection  and/or diagnosis of SARS-CoV-2 by FDA under an Emergency Use Authorization (EUA). This EUA will remain in effect (meaning this test can be used) for the duration of the COVID-19 declaration under Section 564(b)(1) of the Act, 21 U.S.C. section 360bbb-3(b)(1), unless the authorization is terminated or revoked.  Performed at [facility], [address], [place], [place] [address]      Radiology Studies: No results found.  Scheduled Meds:  enoxaparin (LOVENOX) injection  40 mg Subcutaneous Q24H   fluticasone furoate-vilanterol  1 puff Inhalation Daily   And   umeclidinium bromide  1 puff Inhalation Daily   levothyroxine  25 mcg Oral Q0600   rosuvastatin  10 mg Oral Daily   Continuous Infusions:  sodium chloride Stopped (10/12/21 0002)     LOS: 3 days   Desma Maxim, MD [facility] Pager On Amion  If 7PM-7AM, please contact night-coverage 10/12/2021, 1:51 PM

## 2021-10-12 NOTE — TOC Progression Note (Signed)
Transition of Care [facility]) - Progression Note    Patient Details  Name: Rosealie Reach MRN: [id number] Date of Birth: [birth year]  Transition of Care [facility]) CM/SW [place], [place] Phone Number: 10/12/2021, 2:40 PM  Clinical Narrative:   Patient seen in follow up PT recommendation of [place] [facility].  Ms Elmore lives here in [place] with her daughter who works from home.  She has all needed DME, and is open to [facility] PT services.  Contacted Cindie with [facility] who confirms they can provide needed services. TOC will continue to follow during the course of hospitalization.     Expected Discharge Plan: [place] Barriers to Discharge: No Barriers Identified  Expected Discharge Plan and Services Expected Discharge Plan: [place]                                               Social Determinants of Health (SDOH) Interventions    Readmission Risk Interventions No flowsheet data found.

## 2021-10-12 NOTE — Plan of Care (Signed)

## 2021-10-12 NOTE — Evaluation (Signed)
Physical Therapy Evaluation Patient Details Name: Marilyn Hobbs MRN: [id number] DOB: January 25, 1929 Today's Date: 10/12/2021  History of Present Illness  Pt is 87 yo female who presented with  low sodium and dehydration. PMH significant for polycythemia vera, HTN, hypothyroidism, COPD, HLD  Clinical Impression  Pt admitted with above diagnosis. Pt ambulated 120' with RW, no loss of balance. She reports she walks with a cane or rollator and performs ADLs independently at baseline, and that her daughter or grandson are home "most of the time".  She is mobilizing well and is appropriate for HHPT following acute DC.  Pt currently with functional limitations due to the deficits listed below (see PT Problem List). Pt will benefit from skilled PT to increase their independence and safety with mobility to allow discharge to the venue listed below.          Recommendations for follow up therapy are one component of a multi-disciplinary discharge planning process, led by the attending physician.  Recommendations may be updated based on patient status, additional functional criteria and insurance authorization.  Follow Up Recommendations Home health PT    Assistance Recommended at Discharge Intermittent Supervision/Assistance  Functional Status Assessment Patient has had a recent decline in their functional status and demonstrates the ability to make significant improvements in function in a reasonable and predictable amount of time.  Equipment Recommendations  None recommended by PT    Recommendations for Other Services       Precautions / Restrictions Precautions Precautions: Fall Precaution Comments: pt can't recall if she's had any falls in the past 6 months Restrictions Weight Bearing Restrictions: No      Mobility  Bed Mobility Overal bed mobility: Modified Independent Bed Mobility: Supine to Sit     Supine to sit: Modified independent (Device/Increase time);HOB elevated     General bed  mobility comments: used rail to pull up    Transfers Overall transfer level: Needs assistance Equipment used: Rolling walker (2 wheels) Transfers: Sit to/from Stand Sit to Stand: Min guard           General transfer comment: no physical assist needed, min/guard for safety    Ambulation/Gait Ambulation/Gait assistance: Supervision Gait Distance (Feet): 120 Feet Assistive device: Rolling walker (2 wheels) Gait Pattern/deviations: Step-through pattern;Decreased stride length Gait velocity: decr     General Gait Details: VCs for proximity to RW, no loss of balance, HR 60s with walking  Stairs            Wheelchair Mobility    Modified Rankin (Stroke Patients Only)       Balance Overall balance assessment: Needs assistance Sitting-balance support: Feet supported;Single extremity supported Sitting balance-Leahy Scale: Good       Standing balance-Leahy Scale: Fair Standing balance comment: reliant on at least single UE support for dynamic standing                             Pertinent Vitals/Pain Pain Assessment: No/denies pain    Home Living Family/patient expects to be discharged to:: Private residence Living Arrangements: Children;Other relatives Available Help at Discharge: Family;Available PRN/intermittently Type of Home: House Home Access: Stairs to enter   Entrance Stairs-Number of Steps: 1   Home Layout: One level Home Equipment: Rollator (4 wheels);Shower seat;Toilet riser;Cane - single point Additional Comments: uses rollator or cane, bathes herself on shower seat; lives with daughter and grandson who are home "most of the time" per pt (daughter works from home)  Prior Function Prior Level of Function : Independent/Modified Independent             Mobility Comments: does not drive, uses rollator or cane ADLs Comments: independent     Hand Dominance        Extremity/Trunk Assessment   Upper Extremity Assessment Upper  Extremity Assessment: Defer to OT evaluation    Lower Extremity Assessment Lower Extremity Assessment: Overall WFL for tasks assessed RLE Deficits / Details: 4/5 knee ext RLE Sensation: WNL LLE Deficits / Details: +4/5 knee ext LLE Sensation: WNL    Cervical / Trunk Assessment Cervical / Trunk Assessment: Normal  Communication   Communication: No difficulties  Cognition Arousal/Alertness: Awake/alert Behavior During Therapy: WFL for tasks assessed/performed Overall Cognitive Status: No family/caregiver present to determine baseline cognitive functioning Area of Impairment: Memory                               General Comments: pt is oriented to self and situation, seems to have some short term memory deficits, pleasant and follows commands        General Comments      Exercises     Assessment/Plan    PT Assessment Patient needs continued PT services  PT Problem List Decreased activity tolerance;Decreased balance       PT Treatment Interventions Gait training;Therapeutic activities;Therapeutic exercise;Patient/family education;Functional mobility training;Balance training    PT Goals (Current goals can be found in the Care Plan section)  Acute Rehab PT Goals Patient Stated Goal: to go home PT Goal Formulation: With patient Time For Goal Achievement: 10/26/21 Potential to Achieve Goals: Good    Frequency Min 3X/week   Barriers to discharge        Co-evaluation               AM-PAC PT "6 Clicks" Mobility  Outcome Measure Help needed turning from your back to your side while in a flat bed without using bedrails?: None Help needed moving from lying on your back to sitting on the side of a flat bed without using bedrails?: A Little Help needed moving to and from a bed to a chair (including a wheelchair)?: None Help needed standing up from a chair using your arms (e.g., wheelchair or bedside chair)?: None Help needed to walk in hospital room?:  None Help needed climbing 3-5 steps with a railing? : A Little 6 Click Score: 22    End of Session Equipment Utilized During Treatment: Gait belt Activity Tolerance: Patient tolerated treatment well Patient left: in chair;with call bell/phone within reach;with chair alarm set Nurse Communication: Mobility status      Time: 1129-1150 PT Time Calculation (min) (ACUTE ONLY): 21 min   Charges:   PT Evaluation $PT Eval Moderate Complexity: 1 Mod         Philomena Doheny PT 10/12/2021  Acute Rehabilitation Services Pager [phone] Office [phone]

## 2021-10-12 NOTE — Progress Notes (Signed)
The cardiac monitor is on standby due to pt's inability to keep it on. Multiple attempts to reorient pt to keep leads on failed. NP (Blount) was notified. We will try putting the leads back on when she sleeps.

## 2021-10-13 LAB — BASIC METABOLIC PANEL
Anion gap: 5 (ref 5–15)
BUN: 10 mg/dL (ref 8–23)
CO2: 22 mmol/L (ref 22–32)
Calcium: 8.6 mg/dL — ABNORMAL LOW (ref 8.9–10.3)
Chloride: 104 mmol/L (ref 98–111)
Creatinine, Ser: 0.99 mg/dL (ref 0.44–1.00)
GFR, Estimated: 54 mL/min — ABNORMAL LOW (ref >60)
Glucose, Bld: 90 mg/dL (ref 70–99)
Potassium: 4.1 mmol/L (ref 3.5–5.1)
Sodium: 131 mmol/L — ABNORMAL LOW (ref 135–145)

## 2021-10-13 LAB — GLUCOSE, CAPILLARY: Glucose-Capillary: 87 mg/dL (ref 70–99)

## 2021-10-13 NOTE — Plan of Care (Signed)

## 2021-10-13 NOTE — Discharge Summary (Signed)
Marilyn Hobbs EHM:[id number] DOB: [birth year] DOA: 10/08/2021  PCP: Janith Lima, MD  Admit date: 10/08/2021 Discharge date: 10/13/2021  Time spent: 45 minutes  Recommendations for Outpatient Follow-up:  Pcp f/u 1-2 weeks will need check of sodium then     Discharge Diagnoses:  Principal Problem:   Hyponatremia Active Problems:   Essential hypertension   Acute kidney injury superimposed on CKD ([place])   Dehydration   Confusion   COPD (chronic obstructive pulmonary disease) ([place])   Discharge Condition: stable  Diet recommendation: regular  Filed Weights   10/08/21 1622 10/08/21 2159  Weight: 48.1 kg 63.3 kg    History of present illness:  Marilyn Hobbs is a 87 y.o. female with medical history significant for polycythemia vera, HTN, hypothyroidism, COPD, HLD who presents from [facility] ER for low sodium and dehydration. Marilyn Hobbs is confused and is unsure of why she was sent to the hospital.  Daughter who she lives with is not at the bedside.  Per report patient was seen by her oncologist today for follow-up and felt to be dehydrated and found of low sodium was sent to the emergency room.  Was given IV fluid hydration and sodium increased mildly from 123-124 was then sent to [facility] for observation and further treatment.  There is no report of any abdominal pain, nausea, vomiting, diarrhea.  It is unclear at this time what her p.o. intake is been the last few days.  Attempted to call daughter to obtain more history but there is no answer.   Hospital Course:  Presented with hyponatremia secondary to dehydration, does not drink adequate amounts at home. Baseline sodium appears to be in low 130s, here was 123 on presentation with AKI. AKI resolved with fluids. Sodium improved to low 130s with fluids. Tolerates PO. On hctz at home, we will hold that at discharge. PT evaluated; home health PT ordered. Advising adequate fluids and pcp f/u 1-2 weeks for repeat check of sodium.    Procedures: none   Consultations: none  Discharge Exam: Vitals:   10/12/21 2149 10/13/21 0543  BP: 116/70 133/71  Pulse: 79 74  Resp: 16 12  Temp: 98.4 F (36.9 C) 98.6 F (37 C)  SpO2: 98% 98%    General exam: Awake, laying in bed, in nad Respiratory system: Normal respiratory effort, no wheezing Cardiovascular system: regular rate, s1, s2 Gastrointestinal system: Soft, nondistended, positive BS Central nervous system: moving all 4 extremities Extremities: Perfused, no clubbing Skin: Normal skin turgor, no notable skin lesions seen Psychiatry: Mood normal   Discharge Instructions   Discharge Instructions     Diet general   Complete by: As directed    Face-to-face encounter (required for Medicare/Medicaid patients)   Complete by: As directed    I Desma Maxim certify that this patient is under my care and that I, or a nurse practitioner or physician's assistant working with me, had a face-to-face encounter that meets the physician face-to-face encounter requirements with this patient on 10/12/2021. The encounter with the patient was in whole, or in part for the following medical condition(s) which is the primary reason for home health care (List medical condition): hyponatremia   The encounter with the patient was in whole, or in part, for the following medical condition, which is the primary reason for home health care: hyponatremia   I certify that, based on my findings, the following services are medically necessary home health services: Physical therapy   Reason for Medically Necessary Home Health Services:  Therapy- Therapeutic Exercises to Increase Strength and Endurance   My clinical findings support the need for the above services: Cognitive impairments, dementia, or mental confusion  that make it unsafe to leave home   Further, I certify that my clinical findings support that this patient is homebound due to: Mental confusion   Home Health   Complete by: As  directed    To provide the following care/treatments: PT   Increase activity slowly   Complete by: As directed       Allergies as of 10/13/2021   No Known Allergies      Medication List     STOP taking these medications    lisinopril-hydrochlorothiazide 20-12.5 MG tablet Commonly known as: Zestoretic   revefenacin 175 MCG/3ML nebulizer solution Commonly known as: YUPELRI       TAKE these medications    albuterol 108 (90 Base) MCG/ACT inhaler Commonly known as: VENTOLIN HFA Inhale 2 puffs into the lungs every 6 (six) hours as needed for wheezing or shortness of breath.   ALPRAZolam 0.25 MG tablet Commonly known as: XANAX TAKE ONE TABLET BY MOUTH ONE TIME DAILY AS NEEDED ANXIETY What changed: See the new instructions.   amLODipine 5 MG tablet Commonly known as: NORVASC Take 1 tablet (5 mg total) by mouth daily.   aspirin EC 81 MG tablet Take 81 mg by mouth daily. Swallow whole.   carboxymethylcellulose 0.5 % Soln Commonly known as: REFRESH PLUS Place 1 drop into both eyes 3 (three) times daily as needed (dry eyes).   levothyroxine 25 MCG tablet Commonly known as: SYNTHROID Take 1 tablet (25 mcg total) by mouth daily before breakfast.   multivitamin capsule Take 1 capsule by mouth daily.   rosuvastatin 10 MG tablet Commonly known as: CRESTOR TAKE ONE TABLET BY MOUTH ONE TIME DAILY   Trelegy Ellipta 200-62.5-25 MCG/ACT Aepb Generic drug: Fluticasone-Umeclidin-Vilant Inhale 1 puff into the lungs daily. What changed: Another medication with the same name was removed. Continue taking this medication, and follow the directions you see here.       No Known Allergies  Follow-up Information     [facility], [facility] Follow up.   Specialty: Home Health Services Why: They will call your daughter to arrange a time to come out next week. Contact information: [address] [place] [address] [phone]                  The  results of significant diagnostics from this hospitalization (including imaging, microbiology, ancillary and laboratory) are listed below for reference.    Significant Diagnostic Studies: DG Chest 2 View  Result Date: 10/08/2021 CLINICAL DATA:  Shortness of breath EXAM: CHEST - 2 VIEW COMPARISON:  07/04/2021 FINDINGS: Cardiac size is within normal limits. Low position of diaphragms and increase in AP diameter of chest suggests COPD. There are no signs of alveolar pulmonary edema or focal pulmonary consolidation. There is haziness at left cardiophrenic angle which may suggest pleural adhesions. There is no pleural effusion or pneumothorax. IMPRESSION: COPD. There are no signs of pulmonary edema or focal pulmonary consolidation. Electronically Signed   By: Elmer Picker M.D.   On: 10/08/2021 16:57   CT Head Wo Contrast  Result Date: 10/08/2021 CLINICAL DATA:  Delirium confusion EXAM: CT HEAD WITHOUT CONTRAST TECHNIQUE: Contiguous axial images were obtained from the base of the skull through the vertex without intravenous contrast. COMPARISON:  Head CT 07/04/2021 FINDINGS: Brain: No acute territorial infarction, hemorrhage or intracranial mass.  Moderate atrophy. Mild hypodensity in the white matter consistent with chronic small vessel ischemic change. Nonenlarged ventricles Vascular: No hyperdense vessels.  Carotid vascular calcification Skull: Normal. Negative for fracture or focal lesion. Sinuses/Orbits: No acute finding. Other: None IMPRESSION: 1. No CT evidence for acute intracranial abnormality. 2. Atrophy and mild chronic small vessel ischemic changes of the white matter. Electronically Signed   By: Donavan Foil M.D.   On: 10/08/2021 19:03    Microbiology: Recent Results (from the past 240 hour(s))  Resp Panel by RT-PCR (Flu A&B, Covid) Nasopharyngeal Swab     Status: None   Collection Time: 10/08/21  5:12 PM   Specimen: Nasopharyngeal Swab; Nasopharyngeal(NP) swabs in vial transport  medium  Result Value Ref Range Status   SARS Coronavirus 2 by RT PCR NEGATIVE NEGATIVE Final    Comment: (NOTE) SARS-CoV-2 target nucleic acids are NOT DETECTED.  The SARS-CoV-2 RNA is generally detectable in upper respiratory specimens during the acute phase of infection. The lowest concentration of SARS-CoV-2 viral copies this assay can detect is 138 copies/mL. A negative result does not preclude SARS-Cov-2 infection and should not be used as the sole basis for treatment or other patient management decisions. A negative result may occur with  improper specimen collection/handling, submission of specimen other than nasopharyngeal swab, presence of viral mutation(s) within the areas targeted by this assay, and inadequate number of viral copies(<138 copies/mL). A negative result must be combined with clinical observations, patient history, and epidemiological information. The expected result is Negative.  Fact Sheet for Patients:  EntrepreneurPulse.com.au  Fact Sheet for Healthcare Providers:  IncredibleEmployment.be  This test is no t yet approved or cleared by the [place] FDA and  has been authorized for detection and/or diagnosis of SARS-CoV-2 by FDA under an Emergency Use Authorization (EUA). This EUA will remain  in effect (meaning this test can be used) for the duration of the COVID-19 declaration under Section 564(b)(1) of the Act, 21 U.S.C.section 360bbb-3(b)(1), unless the authorization is terminated  or revoked sooner.       Influenza A by PCR NEGATIVE NEGATIVE Final   Influenza B by PCR NEGATIVE NEGATIVE Final    Comment: (NOTE) The Xpert Xpress SARS-CoV-2/FLU/RSV plus assay is intended as an aid in the diagnosis of influenza from Nasopharyngeal swab specimens and should not be used as a sole basis for treatment. Nasal washings and aspirates are unacceptable for Xpert Xpress SARS-CoV-2/FLU/RSV testing.  Fact Sheet for  Patients: EntrepreneurPulse.com.au  Fact Sheet for Healthcare Providers: IncredibleEmployment.be  This test is not yet approved or cleared by the [place] FDA and has been authorized for detection and/or diagnosis of SARS-CoV-2 by FDA under an Emergency Use Authorization (EUA). This EUA will remain in effect (meaning this test can be used) for the duration of the COVID-19 declaration under Section 564(b)(1) of the Act, 21 U.S.C. section 360bbb-3(b)(1), unless the authorization is terminated or revoked.  Performed at [facility], [address] [place], [place] [address]      Labs: Basic Metabolic Panel: Recent Labs  Lab 10/09/21 1631 10/10/21 1431 10/11/21 0506 10/12/21 0441 10/13/21 0524  NA 125* 129* 131* 133* 131*  K 4.4 4.3 4.0 3.9 4.1  CL 95* 100 103 106 104  CO2 22 21* 21* 22 22  GLUCOSE 92 87 80 80 90  BUN 37* 21 15 10 10   CREATININE 1.33* 0.88 0.89 0.82 0.99  CALCIUM 8.8* 8.6* 8.6* 8.7* 8.6*   Liver Function Tests: Recent Labs  Lab 10/08/21 1501 10/08/21 1702  AST 19 27  ALT 36 36  ALKPHOS 71 76  BILITOT 0.7 0.8  PROT 7.2 6.7  ALBUMIN 4.3 4.2   No results for input(s): LIPASE, AMYLASE in the last 168 hours. No results for input(s): AMMONIA in the last 168 hours. CBC: Recent Labs  Lab 10/08/21 1501 10/08/21 1702 10/09/21 0448  WBC 11.4* 11.5* 7.9  NEUTROABS 9.6* 9.8*  --   HGB 15.8* 16.1* 14.4  HCT 45.2 45.2 41.2  MCV 88.5 87.6 88.2  PLT 288 306 145*   Cardiac Enzymes: No results for input(s): CKTOTAL, CKMB, CKMBINDEX, TROPONINI in the last 168 hours. BNP: BNP (last 3 results) No results for input(s): BNP in the last 8760 hours.  ProBNP (last 3 results) No results for input(s): PROBNP in the last 8760 hours.  CBG: Recent Labs  Lab 10/13/21 0751  GLUCAP 87       Signed:  Desma Maxim MD.  [facility] 10/13/2021, 1:18 PM

## 2021-10-13 NOTE — Progress Notes (Signed)
The patient is injury-free, afebrile, alert, and oriented X 3. The vital signs were within the baseline during this shift. She complained of mild SOB and appeared anxious. SOB improved with nebulizer treatment. Pt denies chest pain, nausea, vomiting, dizziness, signs or symptoms of bleeding or infection or acute changes during this shift. We will continue to monitor and work toward achieving the care plan goals

## 2021-10-15 ENCOUNTER — Telehealth: Payer: Self-pay | Admitting: Internal Medicine

## 2021-10-15 ENCOUNTER — Telehealth: Payer: Self-pay

## 2021-10-15 NOTE — Telephone Encounter (Signed)
Transition Care Management Unsuccessful Follow-up Telephone Call  Date of discharge and from where:  Marilyn Hobbs 10/13/2021  Attempts:  1st Attempt  Reason for unsuccessful TCM follow-up call:  Left voice message

## 2021-10-15 NOTE — Telephone Encounter (Signed)
Clair Gulling from [facility] called   Patient was released from hospital. Pt refused PT service.   Best contact #: [phone]

## 2021-10-16 NOTE — Telephone Encounter (Signed)
Transition Care Management Unsuccessful Follow-up Telephone Call  Date of discharge and from where:  Marilyn Hobbs  10/13/2021  Attempts:  2nd Attempt  Reason for unsuccessful TCM follow-up call:  Unable to reach patient

## 2021-10-18 ENCOUNTER — Telehealth: Payer: Self-pay | Admitting: Internal Medicine

## 2021-10-18 DIAGNOSIS — J418 Mixed simple and mucopurulent chronic bronchitis: Secondary | ICD-10-CM

## 2021-10-18 NOTE — Telephone Encounter (Signed)
PCP gave the ok to proceed with DME.  Called pt, LVM.   Need clarification on if she is wanting a manual wheelchair.

## 2021-10-18 NOTE — Telephone Encounter (Signed)
error 

## 2021-10-18 NOTE — Telephone Encounter (Signed)
Patient requesting an order for a wheelchair  Patient requesting a call back to discuss request  Please call home number

## 2021-10-18 NOTE — Telephone Encounter (Signed)
Patient calling back in  Patient says she would like to have order placed for electric wheelchair but if medicare wont cover then order for manual wheelchair would be fine  Please call patient once order has been placed [phone]

## 2021-10-19 NOTE — Addendum Note (Signed)
Addended by: Hinda Kehr on: 10/19/2021 07:38 AM   Modules accepted: Orders

## 2021-10-20 NOTE — Addendum Note (Signed)
Addended by: Janith Lima on: 10/20/2021 10:18 AM   Modules accepted: Orders

## 2021-10-22 LAB — JAK 2 V617F (GENPATH)

## 2021-10-22 NOTE — Telephone Encounter (Signed)
DME signed by PCP  Called pt, LVM needing to know where she would like it faxed or is she would like to pick it up and/or mailed.

## 2021-10-24 ENCOUNTER — Other Ambulatory Visit: Payer: Self-pay | Admitting: Internal Medicine

## 2021-10-24 DIAGNOSIS — E890 Postprocedural hypothyroidism: Secondary | ICD-10-CM

## 2021-10-26 ENCOUNTER — Telehealth: Payer: Self-pay | Admitting: Family

## 2021-10-26 NOTE — Telephone Encounter (Signed)
Called patient's daughter [name] to go over lab results including JAK 2. Left call back number.

## 2021-10-29 NOTE — Telephone Encounter (Signed)
DME has been mailed to the home

## 2021-11-04 ENCOUNTER — Encounter: Payer: Self-pay | Admitting: Internal Medicine

## 2021-11-04 NOTE — Progress Notes (Signed)
Subjective:    Patient ID: Marilyn Hobbs, female    DOB: 04-26-1929, 87 y.o.   MRN: [id number]  This visit occurred during the SARS-CoV-2 public health emergency.  Safety protocols were in place, including screening questions prior to the visit, additional usage of staff PPE, and extensive cleaning of exam room while observing appropriate contact time as indicated for disinfecting solutions.     HPI The patient is here for follow up the hospital.   She is here with her daughter.   Admitted 11/21 - 11/26 for hyponatremia  She has PCV, htn, hypothyroidism, COPD, hld who went for admission due to hyponatremia and dehydration.  She had seen her oncologist that day.  She had no other symptoms.   Her hyponatremia was secondary to dehydration - she does not drink enough fluids at home.  Baseline sodium is low 130's.  Her sodium level was 123 in the ED with AKI.  Sodium improved to 130's with fluid and AKI resolved.  She was tolerating PO.  Hctz held.  Discharged home with home PT.  She was advised to increase fluid intake.   Medication changes on discharge  - lisinopril-hctz stopped.  No other changes.  She is taking amlodipine 5mg  daily.  PT has stopped.  She does not check her BP at home.  She is drinking more fluids.    She has some lightheadedness at times.    She worries about her memory.    Medications and allergies reviewed with patient and updated if appropriate.  Patient Active Problem List   Diagnosis Date Noted   Hyponatremia 10/08/2021   Acute kidney injury superimposed on CKD ([place]) 10/08/2021   Dehydration 10/08/2021   Confusion 10/08/2021   COPD (chronic obstructive pulmonary disease) ([place]) 10/08/2021   Mixed simple and mucopurulent chronic bronchitis ([place]) 08/24/2021   Postoperative hypothyroidism 07/12/2021   Polycythemia vera ([place]) 07/12/2021   Essential hypertension 07/04/2021   CKD (chronic kidney disease), stage III ([place]) 07/04/2021    Current Outpatient  Medications on File Prior to Visit  Medication Sig Dispense Refill   ALPRAZolam (XANAX) 0.25 MG tablet TAKE ONE TABLET BY MOUTH ONE TIME DAILY AS NEEDED ANXIETY (Patient taking differently: Take 0.25 mg by mouth daily as needed for anxiety.) 30 tablet 5   amLODipine (NORVASC) 5 MG tablet Take 1 tablet (5 mg total) by mouth daily. 90 tablet 1   aspirin EC 81 MG tablet Take 81 mg by mouth daily. Swallow whole.     carboxymethylcellulose (REFRESH PLUS) 0.5 % SOLN Place 1 drop into both eyes 3 (three) times daily as needed (dry eyes).     Fluticasone-Umeclidin-Vilant (TRELEGY ELLIPTA) 200-62.5-25 MCG/ACT AEPB Inhale 1 puff into the lungs daily. 1 each 0   Multiple Vitamin (MULTIVITAMIN) capsule Take 1 capsule by mouth daily.     rosuvastatin (CRESTOR) 10 MG tablet TAKE ONE TABLET BY MOUTH ONE TIME DAILY (Patient taking differently: Take 10 mg by mouth daily.) 90 tablet 1   SYNTHROID 25 MCG tablet TAKE ONE TABLET BY MOUTH ONE TIME DAILY BEFORE BREAKFAST 90 tablet 0   albuterol (VENTOLIN HFA) 108 (90 Base) MCG/ACT inhaler Inhale 2 puffs into the lungs every 6 (six) hours as needed for wheezing or shortness of breath. (Patient not taking: Reported on 10/09/2021) 8 g 8   No current facility-administered medications on file prior to visit.    Past Medical History:  Diagnosis Date   COPD (chronic obstructive pulmonary disease) ([place])    High  cholesterol    Hypertension    Hypothyroidism    Polycythemia     Past Surgical History:  Procedure Laterality Date   BACK SURGERY     THYROIDECTOMY      Social History   Socioeconomic History   Marital status: Single    Spouse name: Not on file   Number of children: Not on file   Years of education: Not on file   Highest education level: Not on file  Occupational History   Not on file  Tobacco Use   Smoking status: Never    Passive exposure: Past   Smokeless tobacco: Never  Vaping Use   Vaping Use: Never used  Substance and Sexual Activity    Alcohol use: Yes    Alcohol/week: 1.0 standard drink    Types: 1 Glasses of wine per week    Comment: daily   Drug use: Never   Sexual activity: Not on file  Other Topics Concern   Not on file  Social History Narrative   Not on file   Social Determinants of Health   Financial Resource Strain: Not on file  Food Insecurity: Not on file  Transportation Needs: Not on file  Physical Activity: Not on file  Stress: Not on file  Social Connections: Not on file    Family History  Problem Relation Age of Onset   Breast cancer Mother     Review of Systems  Constitutional:  Negative for fever.  Respiratory:  Positive for shortness of breath (chronic). Negative for cough and wheezing.   Cardiovascular:  Negative for chest pain, palpitations and leg swelling.  Neurological:  Positive for light-headedness. Negative for headaches.      Objective:   Vitals:   11/05/21 1358  BP: 104/72  Pulse: (!) 106  Temp: 98.2 F (36.8 C)  SpO2: 98%   BP Readings from Last 3 Encounters:  11/05/21 104/72  10/13/21 131/72  10/08/21 100/62   Wt Readings from Last 3 Encounters:  11/05/21 136 lb 3.2 oz (61.8 kg)  10/08/21 139 lb 8.8 oz (63.3 kg)  10/08/21 137 lb 1.9 oz (62.2 kg)   Body mass index is 23.38 kg/m.   Physical Exam    Constitutional: Appears well-developed and well-nourished. No distress.  HENT:  Head: Normocephalic and atraumatic.  Neck: Neck supple. No tracheal deviation present. No thyromegaly present.  No cervical lymphadenopathy Cardiovascular: Normal rate, regular rhythm and normal heart sounds.   No murmur heard. No carotid bruit .  Mild b/l LE edema Pulmonary/Chest: Effort normal and breath sounds normal. No respiratory distress. No has no wheezes. No rales.  Skin: Skin is warm and dry. Not diaphoretic.  Psychiatric: Normal mood and affect. Behavior is normal.      Assessment & Plan:    Hyponatremia: Related to dehydration - improved in the hospital with  IVF Lisinopril-hctz stopped She is drinking more fluids  Hypertension: Chronic Lisinopril-hctz stopped BP on low side today, taking amlodipine 5 mg daily She is having some lightheadedness Will decrease amlodipine to 2.5 mg daily Advised monitoring BP at home  ? Memory problems - advised discussing with PCP Can refer to neuropsychology for full evaluation

## 2021-11-05 ENCOUNTER — Ambulatory Visit (INDEPENDENT_AMBULATORY_CARE_PROVIDER_SITE_OTHER): Payer: Medicare Other | Admitting: Internal Medicine

## 2021-11-05 ENCOUNTER — Other Ambulatory Visit: Payer: Self-pay

## 2021-11-05 VITALS — BP 104/72 | HR 106 | Temp 98.2°F | Ht 64.0 in | Wt 136.2 lb

## 2021-11-05 DIAGNOSIS — I10 Essential (primary) hypertension: Secondary | ICD-10-CM | POA: Diagnosis not present

## 2021-11-05 DIAGNOSIS — E871 Hypo-osmolality and hyponatremia: Secondary | ICD-10-CM | POA: Diagnosis not present

## 2021-11-05 MED ORDER — AMLODIPINE BESYLATE 2.5 MG PO TABS: 2.5000 mg | ORAL_TABLET | Freq: Every day | ORAL | 1 refills | Status: DC

## 2021-11-05 NOTE — Patient Instructions (Addendum)
° ° °  Medications changes include :   decrease your amlodipine to 2.5 mg daily  Your prescription(s) have been submitted to your pharmacy. Please take as directed and contact our office if you believe you are having problem(s) with the medication(s).    Check your blood pressure and heart rate once a day only.  Continue to drink plenty of fluids.

## 2021-11-09 ENCOUNTER — Other Ambulatory Visit: Payer: Self-pay | Admitting: Family

## 2021-11-09 DIAGNOSIS — D751 Secondary polycythemia: Secondary | ICD-10-CM

## 2021-11-13 ENCOUNTER — Inpatient Hospital Stay: Payer: Medicare Other | Attending: Hematology & Oncology

## 2021-11-13 ENCOUNTER — Ambulatory Visit: Payer: Medicare Other | Admitting: Family

## 2021-12-19 ENCOUNTER — Telehealth: Payer: Self-pay | Admitting: Internal Medicine

## 2021-12-19 NOTE — Telephone Encounter (Signed)
Pt's daughter states pt has periodic pain that starts behind the ear and goes to the top of pt's head   Daughter requesting a call back for recommendations on which doctor she should schedule an appt with  Offered caller an appt, caller declined stating she will wait for provider's recommendations

## 2021-12-19 NOTE — Telephone Encounter (Signed)
Pt has been scheduled for an OV on 2/9 @ 2.20pm

## 2021-12-27 ENCOUNTER — Ambulatory Visit: Payer: Medicare Other | Admitting: Internal Medicine

## 2021-12-30 ENCOUNTER — Other Ambulatory Visit: Payer: Self-pay | Admitting: Internal Medicine

## 2021-12-30 DIAGNOSIS — E890 Postprocedural hypothyroidism: Secondary | ICD-10-CM

## 2022-01-03 ENCOUNTER — Encounter: Payer: Self-pay | Admitting: Internal Medicine

## 2022-01-03 ENCOUNTER — Other Ambulatory Visit: Payer: Self-pay

## 2022-01-03 ENCOUNTER — Ambulatory Visit (INDEPENDENT_AMBULATORY_CARE_PROVIDER_SITE_OTHER): Payer: Medicare Other | Admitting: Internal Medicine

## 2022-01-03 VITALS — BP 112/72 | HR 82 | Temp 98.0°F | Ht 64.0 in | Wt 134.0 lb

## 2022-01-03 DIAGNOSIS — E871 Hypo-osmolality and hyponatremia: Secondary | ICD-10-CM | POA: Diagnosis not present

## 2022-01-03 DIAGNOSIS — I10 Essential (primary) hypertension: Secondary | ICD-10-CM | POA: Diagnosis not present

## 2022-01-03 DIAGNOSIS — B0229 Other postherpetic nervous system involvement: Secondary | ICD-10-CM | POA: Diagnosis not present

## 2022-01-03 DIAGNOSIS — R21 Rash and other nonspecific skin eruption: Secondary | ICD-10-CM | POA: Diagnosis not present

## 2022-01-03 MED ORDER — GABAPENTIN 100 MG PO CAPS: 100.0000 mg | ORAL_CAPSULE | Freq: Three times a day (TID) | ORAL | 3 refills | Status: DC

## 2022-01-03 NOTE — Patient Instructions (Signed)
Please take all new medication as prescribed  - the gabapentin 100 mg up to three times per day  Please continue all other medications as before, and refills have been done if requested.  Please have the pharmacy call with any other refills you may need.  Please keep your appointments with your specialists as you may have planned  Please go to the LAB at the blood drawing area for the tests to be done - at the [facility] in the basement of [facility] - at your convenience (no appt needed, no fasting needed)  You will be contacted by phone if any changes need to be made immediately.  Otherwise, you will receive a letter about your results with an explanation, but please check with MyChart first.  Please remember to sign up for MyChart if you have not done so, as this will be important to you in the future with finding out test results, communicating by private email, and scheduling acute appointments online when needed.

## 2022-01-03 NOTE — Progress Notes (Signed)
Patient ID: Marilyn Hobbs, female   DOB: 11/01/1929, 88 y.o.   MRN: [id number]        Chief Complaint: follow up rash with HA, htn, hyponatremia       HPI:  Marilyn Hobbs is a 88 y.o. female here with family with recent onset rash to left neck,, left upper chest with discomfort radiating to the left head, now 2-3 wks mostly scabbed, Pt denies other chest pain, increased sob or doe, wheezing, orthopnea, PND, increased LE swelling, palpitations, dizziness or syncope.   Pt denies polydipsia, polyuria, or new focal neuro s/s.   Pt denies fever, wt loss, night sweats, loss of appetite, or other constitutional symptoms         Wt Readings from Last 3 Encounters:  01/03/22 134 lb (60.8 kg)  11/05/21 136 lb 3.2 oz (61.8 kg)  10/08/21 139 lb 8.8 oz (63.3 kg)   BP Readings from Last 3 Encounters:  01/03/22 112/72  11/05/21 104/72  10/13/21 131/72         Past Medical History:  Diagnosis Date   COPD (chronic obstructive pulmonary disease) ([facility])    High cholesterol    Hypertension    Hypothyroidism    Polycythemia    Past Surgical History:  Procedure Laterality Date   BACK SURGERY     THYROIDECTOMY      reports that she has never smoked. She has been exposed to tobacco smoke. She has never used smokeless tobacco. She reports current alcohol use of about 1.0 standard drink per week. She reports that she does not use drugs. family history includes Breast cancer in her mother. No Known Allergies Current Outpatient Medications on File Prior to Visit  Medication Sig Dispense Refill   ALPRAZolam (XANAX) 0.25 MG tablet TAKE ONE TABLET BY MOUTH ONE TIME DAILY AS NEEDED ANXIETY (Patient taking differently: Take 0.25 mg by mouth daily as needed for anxiety.) 30 tablet 5   amLODipine (NORVASC) 2.5 MG tablet Take 1 tablet (2.5 mg total) by mouth daily. 90 tablet 1   aspirin EC 81 MG tablet Take 81 mg by mouth daily. Swallow whole.     carboxymethylcellulose (REFRESH PLUS) 0.5 % SOLN Place 1 drop into both  eyes 3 (three) times daily as needed (dry eyes).     Fluticasone-Umeclidin-Vilant (TRELEGY ELLIPTA) 200-62.5-25 MCG/ACT AEPB Inhale 1 puff into the lungs daily. 1 each 0   Multiple Vitamin (MULTIVITAMIN) capsule Take 1 capsule by mouth daily.     rosuvastatin (CRESTOR) 10 MG tablet TAKE ONE TABLET BY MOUTH ONE TIME DAILY (Patient taking differently: Take 10 mg by mouth daily.) 90 tablet 1   SYNTHROID 25 MCG tablet TAKE ONE TABLET BY MOUTH ONE TIME DAILY BEFORE BREAKFAST 90 tablet 0   albuterol (VENTOLIN HFA) 108 (90 Base) MCG/ACT inhaler Inhale 2 puffs into the lungs every 6 (six) hours as needed for wheezing or shortness of breath. (Patient not taking: Reported on 10/09/2021) 8 g 8   No current facility-administered medications on file prior to visit.        ROS:  All others reviewed and negative.  Objective        PE:  BP 112/72 (BP Location: Left Arm, Patient Position: Sitting, Cuff Size: Normal)    Pulse 82    Temp 98 F (36.7 C) (Oral)    Ht 5\' 4"  (1.626 m)    Wt 134 lb (60.8 kg)    SpO2 99%    BMI 23.00 kg/m  Constitutional: Pt appears in NAD               HENT: Head: NCAT.                Right Ear: External ear normal.                 Left Ear: External ear normal.                Eyes: . Pupils are equal, round, and reactive to light. Conjunctivae and EOM are normal               Nose: without d/c or deformity               Neck: Neck supple. Gross normal ROM               Cardiovascular: Normal rate and regular rhythm.                 Pulmonary/Chest: Effort normal and breath sounds without rales or wheezing.                Abd:  Soft, NT, ND, + BS, no organomegaly               Neurological: Pt is alert. At baseline orientation, motor grossly intact               Skin:  LE edema - none, left upper chest with large area post herpetic type rash , mild tender               Psychiatric: Pt behavior is normal without agitation   Micro: none  Cardiac tracings I have  personally interpreted today:  none  Pertinent Radiological findings (summarize): none   Lab Results  Component Value Date   WBC 7.9 10/09/2021   HGB 14.4 10/09/2021   HCT 41.2 10/09/2021   PLT 145 (L) 10/09/2021   GLUCOSE 90 10/13/2021   ALT 36 10/08/2021   AST 27 10/08/2021   NA 131 (L) 10/13/2021   K 4.1 10/13/2021   CL 104 10/13/2021   CREATININE 0.99 10/13/2021   BUN 10 10/13/2021   CO2 22 10/13/2021   TSH 4.198 10/10/2021   Assessment/Plan:  Marilyn Hobbs is a 88 y.o. White or Caucasian [1] female with  has a past medical history of COPD (chronic obstructive pulmonary disease) ([place]), High cholesterol, Hypertension, Hypothyroidism, and Polycythemia.  Rash Resolving , d/w familly - ok to follow  PHN (postherpetic neuralgia) With some discomfort, for gabapentin 100 tid  Hyponatremia Chronic persistent, possibly siadh - for f/u bmp  Essential hypertension BP Readings from Last 3 Encounters:  01/03/22 112/72  11/05/21 104/72  10/13/21 131/72   Stable, pt to continue medical treatment norvasc  Followup: Return if symptoms worsen or fail to improve.  Cathlean Cower, MD 01/06/2022 8:05 PM [facility]

## 2022-01-06 ENCOUNTER — Encounter: Payer: Self-pay | Admitting: Internal Medicine

## 2022-01-06 NOTE — Assessment & Plan Note (Signed)
BP Readings from Last 3 Encounters:  01/03/22 112/72  11/05/21 104/72  10/13/21 131/72   Stable, pt to continue medical treatment norvasc

## 2022-01-06 NOTE — Assessment & Plan Note (Signed)
Chronic persistent, possibly siadh - for f/u bmp

## 2022-01-06 NOTE — Assessment & Plan Note (Signed)
With some discomfort, for gabapentin 100 tid

## 2022-01-06 NOTE — Assessment & Plan Note (Signed)
Resolving , d/w familly - ok to follow

## 2022-03-20 IMAGING — DX DG CHEST 1V PORT
1 series · 1 of 1 positions shown · non-contrast
Comparison: None.

CLINICAL DATA: Dyspnea.

EXAM:
PORTABLE CHEST 1 VIEW

[chest ap]
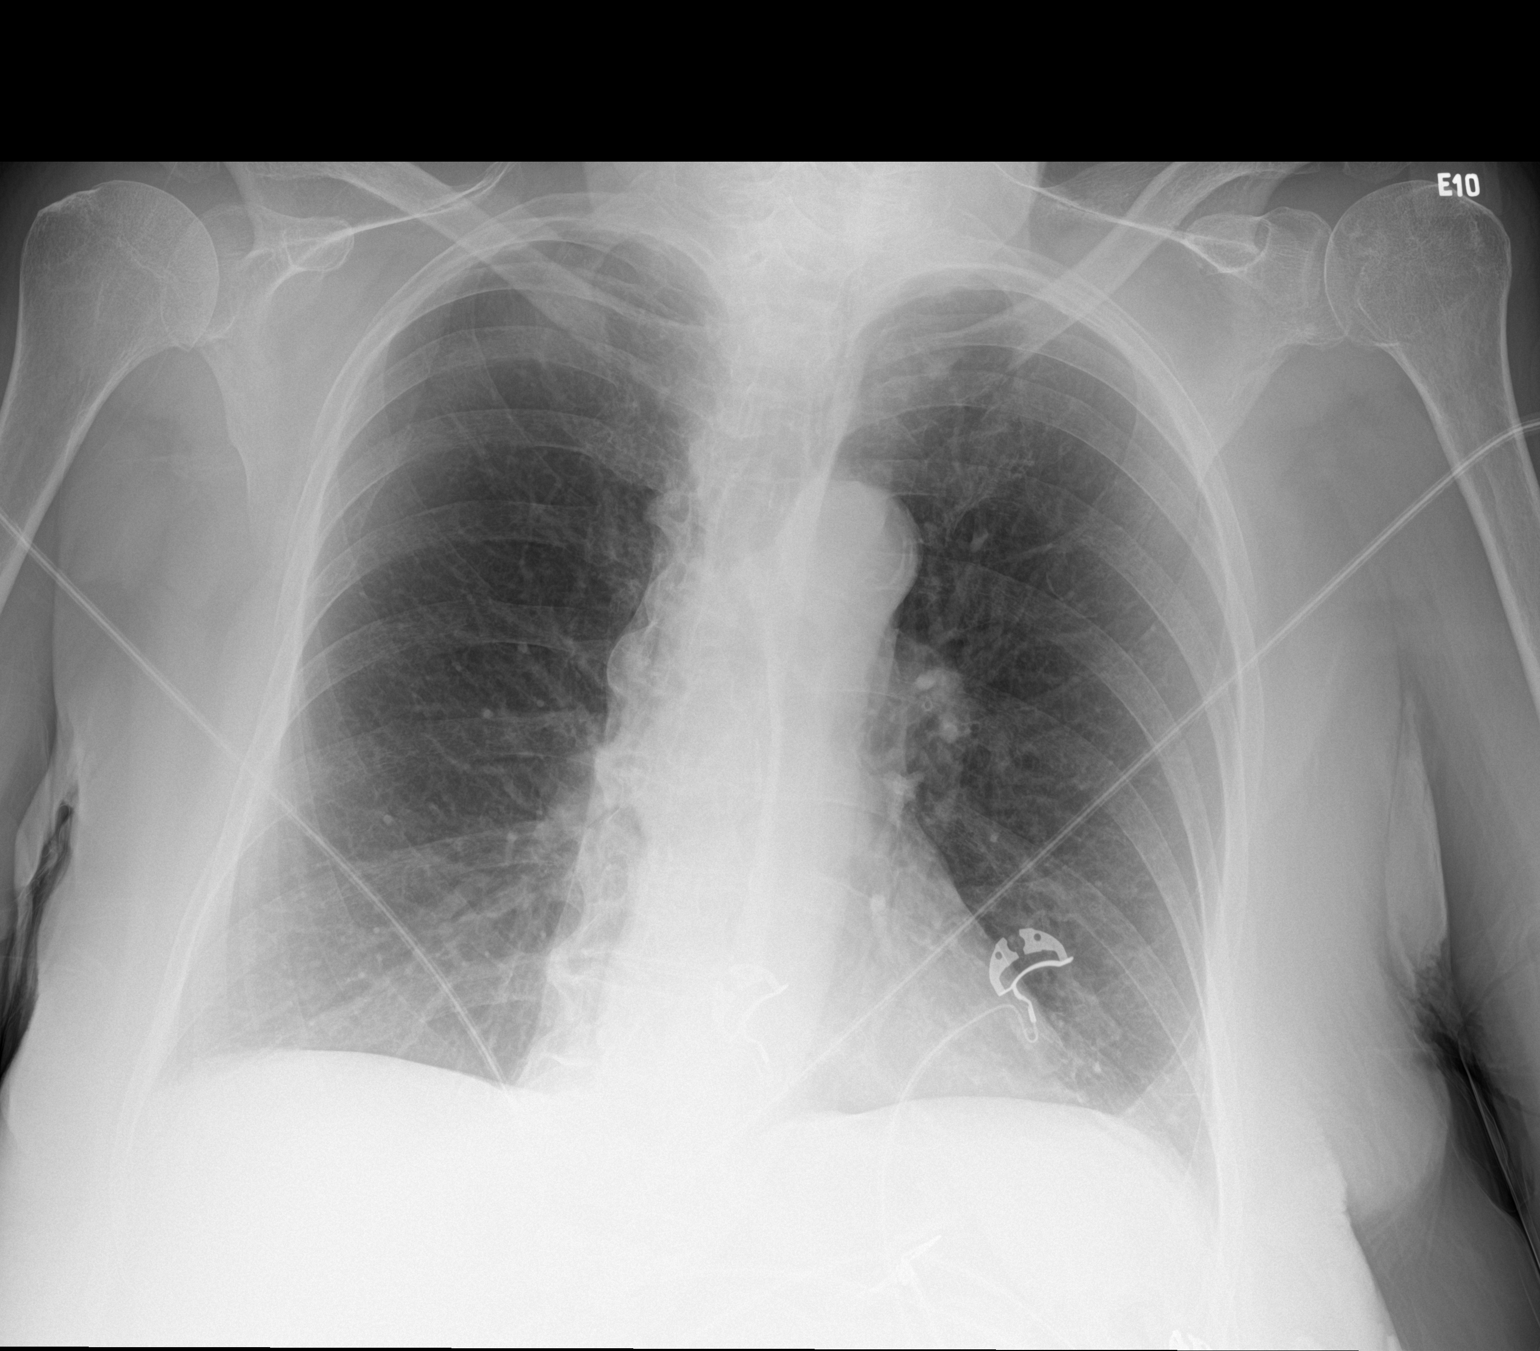

[1 of 1 positions shown; findings below may reference images not displayed]

FINDINGS: The heart size and mediastinal contours are within normal limits.
Both lungs are clear. The visualized skeletal structures are
unremarkable.
IMPRESSION: No active disease.

## 2022-04-21 ENCOUNTER — Other Ambulatory Visit: Payer: Self-pay | Admitting: Internal Medicine

## 2022-04-21 DIAGNOSIS — E890 Postprocedural hypothyroidism: Secondary | ICD-10-CM

## 2022-06-24 IMAGING — CT CT HEAD W/O CM
3 series · 14 of 47 positions shown, 16 images · non-contrast
Comparison: Head CT 07/04/2021

CLINICAL DATA: Delirium confusion

EXAM:
CT HEAD WITHOUT CONTRAST
TECHNIQUE: Contiguous axial images were obtained from the base of the skull
through the vertex without intravenous contrast.

[Series 2: head wo · axial · 0.47mm/px · z∈[+1134,+1274]mm · 8 of 34 slices shown, 10 images]
[im 3/34  brain]
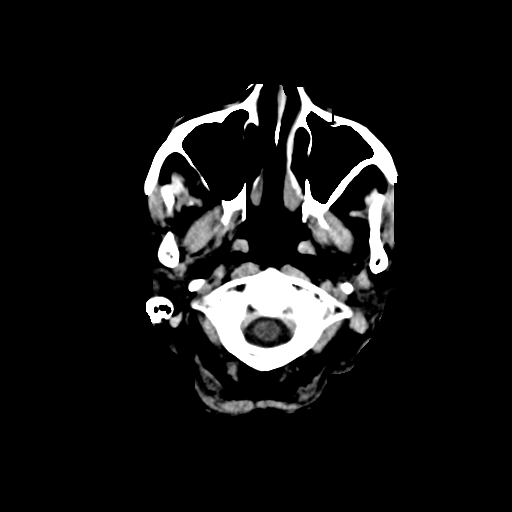
[im 3/34  bone]
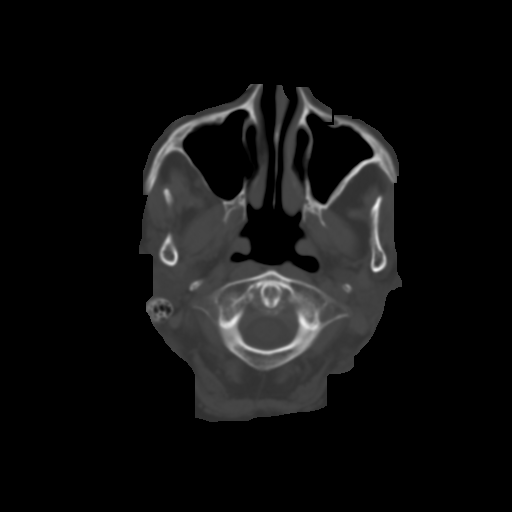
[im 7/34  brain]
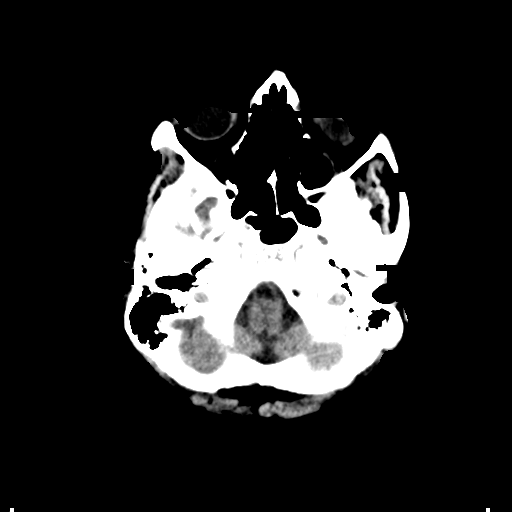
[im 11/34  brain]
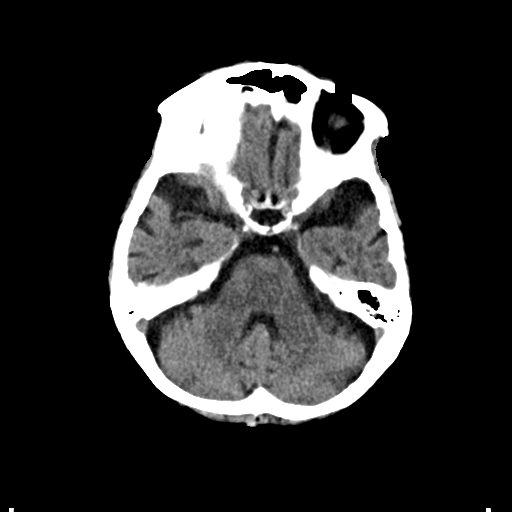
[im 15/34  brain]
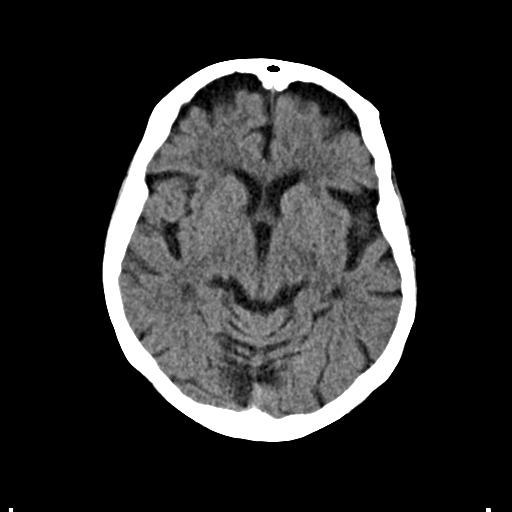
[im 19/34  brain]
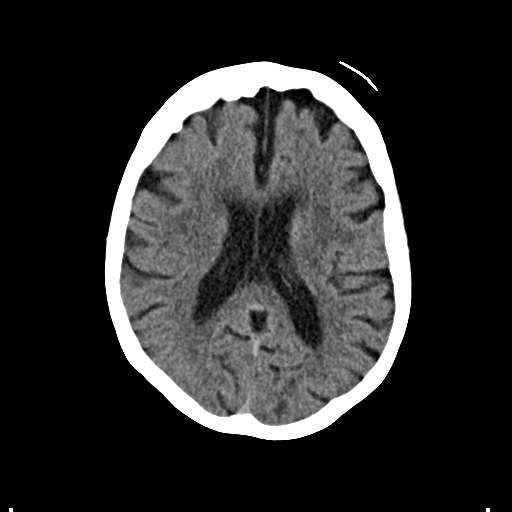
[im 19/34  bone]
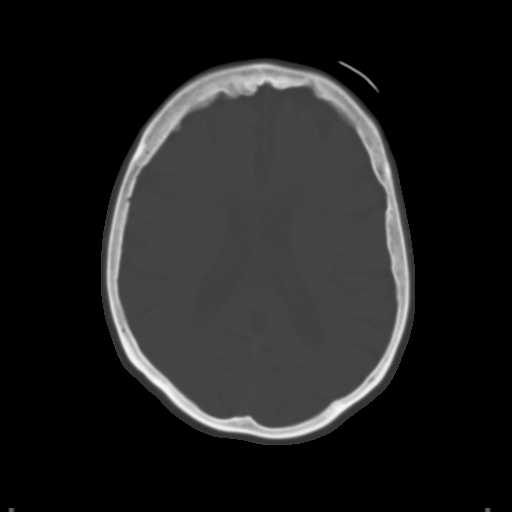
[im 23/34  brain]
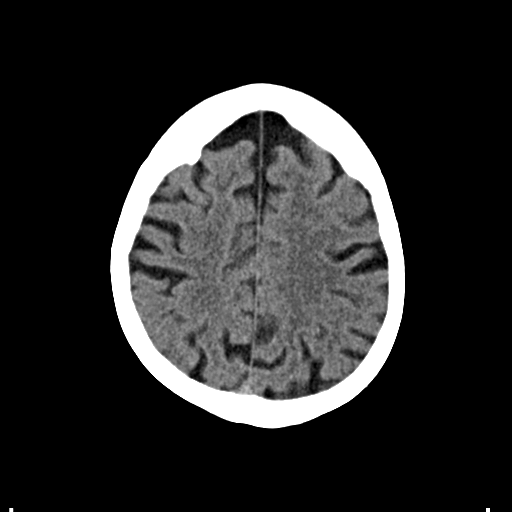
[im 27/34  brain]
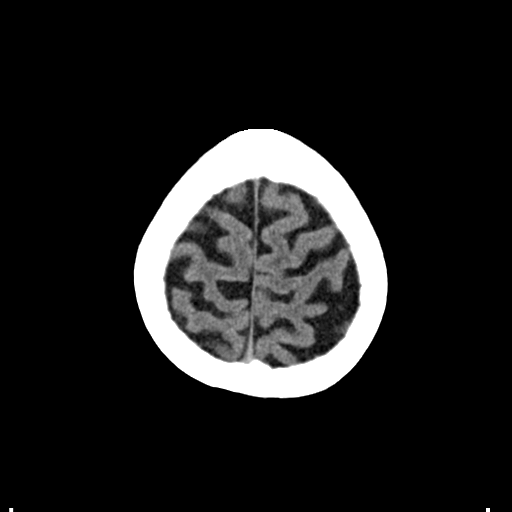
[im 31/34  brain]
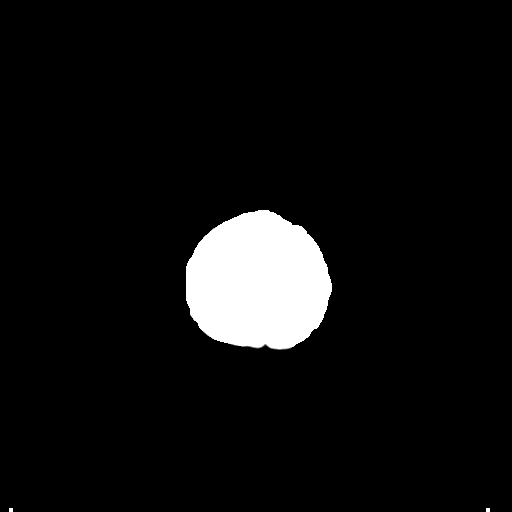

[Series 4: coronal soft · coronal · 0.32mm/px · 3 of 67 slices shown]
[im 23/67  brain]
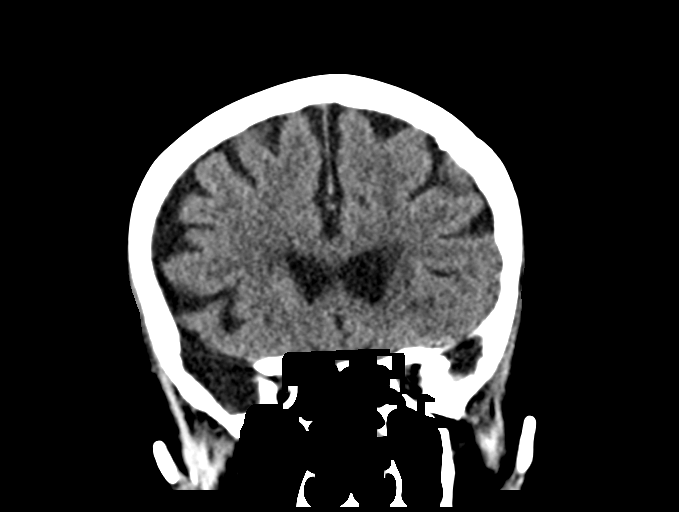
[im 30/67  brain]
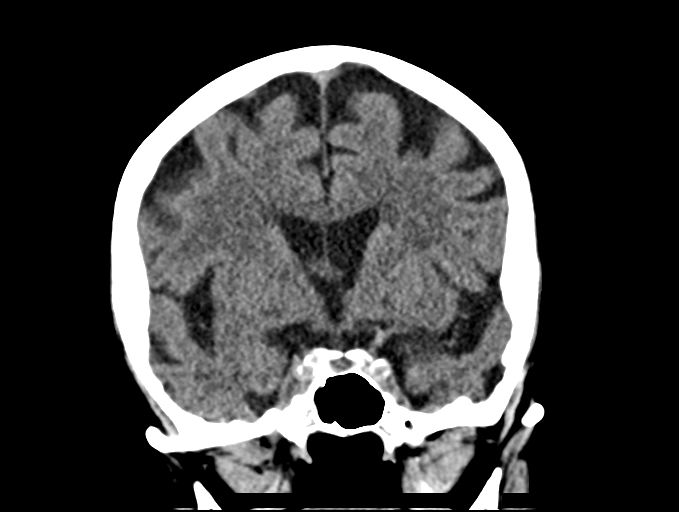
[im 37/67  brain]
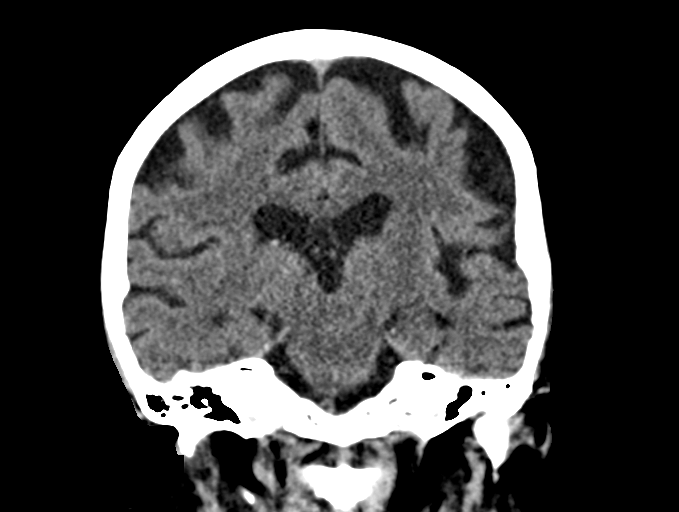

[Series 5: sag soft · sagittal · 0.32mm/px · 3 of 67 slices shown]
[im 23/67  brain]
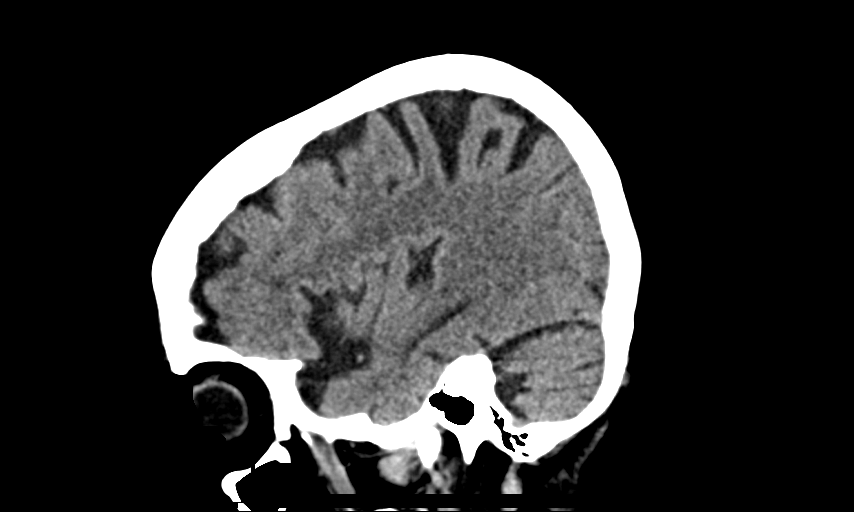
[im 34/67  brain]
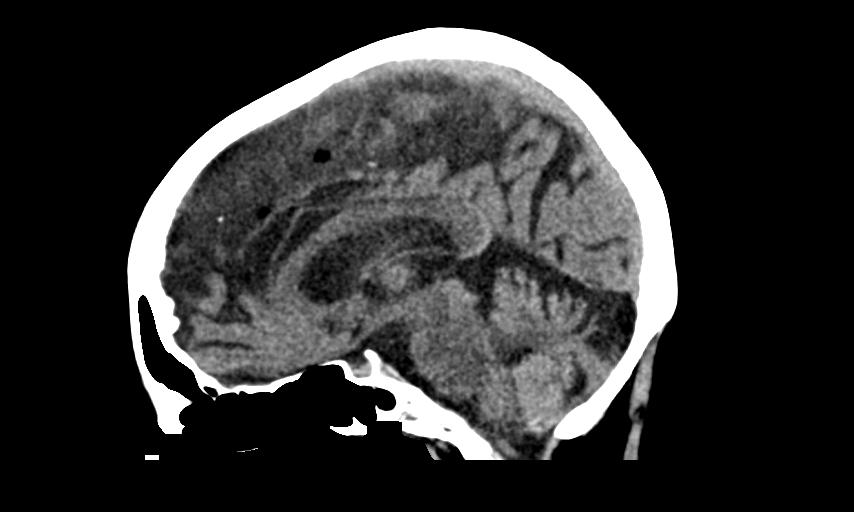
[im 45/67  brain]
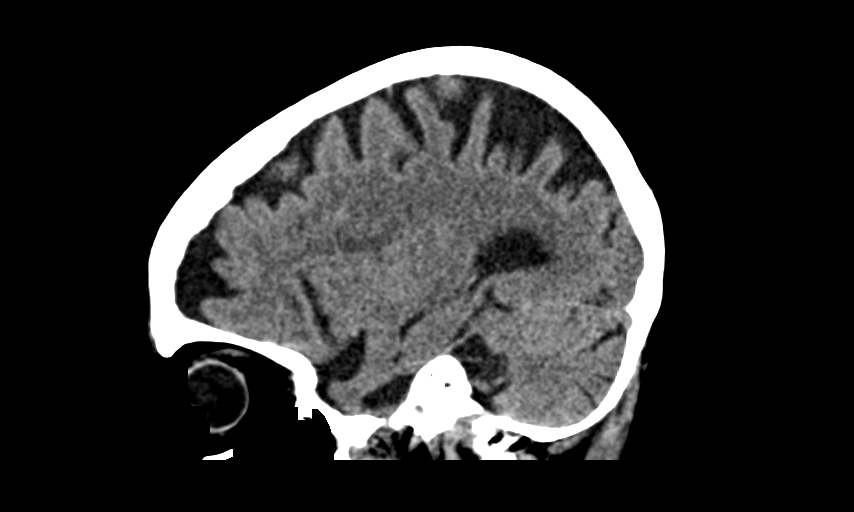

[14 of 47 positions shown; findings below may reference images not displayed]

FINDINGS: Brain: No acute territorial infarction, hemorrhage or intracranial
mass. Moderate atrophy. Mild hypodensity in the white matter
consistent with chronic small vessel ischemic change. Nonenlarged
ventricles

Vascular: No hyperdense vessels.  Carotid vascular calcification

Skull: Normal. Negative for fracture or focal lesion.

Sinuses/Orbits: No acute finding.

Other: None
IMPRESSION: 1. No CT evidence for acute intracranial abnormality.
2. Atrophy and mild chronic small vessel ischemic changes of the
white matter.

## 2022-07-12 ENCOUNTER — Other Ambulatory Visit: Payer: Self-pay | Admitting: Internal Medicine

## 2022-07-12 DIAGNOSIS — E890 Postprocedural hypothyroidism: Secondary | ICD-10-CM

## 2022-07-15 ENCOUNTER — Other Ambulatory Visit: Payer: Self-pay | Admitting: Internal Medicine

## 2022-08-21 ENCOUNTER — Emergency Department (HOSPITAL_COMMUNITY): Payer: Medicare Other

## 2022-08-21 ENCOUNTER — Inpatient Hospital Stay (HOSPITAL_COMMUNITY)
Admission: EM | Admit: 2022-08-21 | Discharge: 2022-09-02 | DRG: 177 | Disposition: A | Discharge status: Skilled Nursing Facility | Payer: Medicare Other | Attending: Family Medicine | Admitting: Family Medicine

## 2022-08-21 ENCOUNTER — Encounter (HOSPITAL_COMMUNITY): Payer: Self-pay

## 2022-08-21 ENCOUNTER — Other Ambulatory Visit: Payer: Self-pay

## 2022-08-21 DIAGNOSIS — M6282 Rhabdomyolysis: Secondary | ICD-10-CM | POA: Diagnosis present

## 2022-08-21 DIAGNOSIS — L0390 Cellulitis, unspecified: Secondary | ICD-10-CM

## 2022-08-21 DIAGNOSIS — J9601 Acute respiratory failure with hypoxia: Secondary | ICD-10-CM | POA: Diagnosis present

## 2022-08-21 DIAGNOSIS — I129 Hypertensive chronic kidney disease with stage 1 through stage 4 chronic kidney disease, or unspecified chronic kidney disease: Secondary | ICD-10-CM | POA: Diagnosis present

## 2022-08-21 DIAGNOSIS — N83209 Unspecified ovarian cyst, unspecified side: Secondary | ICD-10-CM

## 2022-08-21 DIAGNOSIS — N1330 Unspecified hydronephrosis: Secondary | ICD-10-CM | POA: Diagnosis present

## 2022-08-21 DIAGNOSIS — J449 Chronic obstructive pulmonary disease, unspecified: Secondary | ICD-10-CM | POA: Diagnosis not present

## 2022-08-21 DIAGNOSIS — G9341 Metabolic encephalopathy: Secondary | ICD-10-CM | POA: Diagnosis present

## 2022-08-21 DIAGNOSIS — R296 Repeated falls: Secondary | ICD-10-CM | POA: Diagnosis present

## 2022-08-21 DIAGNOSIS — I5A Non-ischemic myocardial injury (non-traumatic): Secondary | ICD-10-CM | POA: Diagnosis present

## 2022-08-21 DIAGNOSIS — R7989 Other specified abnormal findings of blood chemistry: Secondary | ICD-10-CM | POA: Diagnosis not present

## 2022-08-21 DIAGNOSIS — Z803 Family history of malignant neoplasm of breast: Secondary | ICD-10-CM

## 2022-08-21 DIAGNOSIS — R32 Unspecified urinary incontinence: Secondary | ICD-10-CM | POA: Diagnosis present

## 2022-08-21 DIAGNOSIS — N179 Acute kidney failure, unspecified: Secondary | ICD-10-CM | POA: Diagnosis present

## 2022-08-21 DIAGNOSIS — U071 COVID-19: Principal | ICD-10-CM | POA: Diagnosis present

## 2022-08-21 DIAGNOSIS — Y92009 Unspecified place in unspecified non-institutional (private) residence as the place of occurrence of the external cause: Secondary | ICD-10-CM | POA: Diagnosis not present

## 2022-08-21 DIAGNOSIS — Z23 Encounter for immunization: Secondary | ICD-10-CM | POA: Diagnosis present

## 2022-08-21 DIAGNOSIS — E039 Hypothyroidism, unspecified: Secondary | ICD-10-CM

## 2022-08-21 DIAGNOSIS — I10 Essential (primary) hypertension: Secondary | ICD-10-CM | POA: Diagnosis not present

## 2022-08-21 DIAGNOSIS — F419 Anxiety disorder, unspecified: Secondary | ICD-10-CM | POA: Diagnosis present

## 2022-08-21 DIAGNOSIS — I878 Other specified disorders of veins: Secondary | ICD-10-CM | POA: Diagnosis present

## 2022-08-21 DIAGNOSIS — Z79899 Other long term (current) drug therapy: Secondary | ICD-10-CM

## 2022-08-21 DIAGNOSIS — J441 Chronic obstructive pulmonary disease with (acute) exacerbation: Secondary | ICD-10-CM | POA: Diagnosis present

## 2022-08-21 DIAGNOSIS — Z79890 Hormone replacement therapy (postmenopausal): Secondary | ICD-10-CM | POA: Diagnosis not present

## 2022-08-21 DIAGNOSIS — L03031 Cellulitis of right toe: Secondary | ICD-10-CM | POA: Diagnosis present

## 2022-08-21 DIAGNOSIS — D45 Polycythemia vera: Secondary | ICD-10-CM | POA: Diagnosis present

## 2022-08-21 DIAGNOSIS — M869 Osteomyelitis, unspecified: Secondary | ICD-10-CM

## 2022-08-21 DIAGNOSIS — Z7982 Long term (current) use of aspirin: Secondary | ICD-10-CM | POA: Diagnosis not present

## 2022-08-21 DIAGNOSIS — N83201 Unspecified ovarian cyst, right side: Secondary | ICD-10-CM | POA: Diagnosis present

## 2022-08-21 DIAGNOSIS — M79604 Pain in right leg: Secondary | ICD-10-CM

## 2022-08-21 DIAGNOSIS — J069 Acute upper respiratory infection, unspecified: Secondary | ICD-10-CM

## 2022-08-21 DIAGNOSIS — N1831 Chronic kidney disease, stage 3a: Secondary | ICD-10-CM | POA: Diagnosis present

## 2022-08-21 DIAGNOSIS — E7800 Pure hypercholesterolemia, unspecified: Secondary | ICD-10-CM | POA: Diagnosis present

## 2022-08-21 DIAGNOSIS — W19XXXA Unspecified fall, initial encounter: Secondary | ICD-10-CM | POA: Diagnosis not present

## 2022-08-21 DIAGNOSIS — Z7951 Long term (current) use of inhaled steroids: Secondary | ICD-10-CM | POA: Diagnosis not present

## 2022-08-21 DIAGNOSIS — R531 Weakness: Secondary | ICD-10-CM | POA: Diagnosis present

## 2022-08-21 DIAGNOSIS — E890 Postprocedural hypothyroidism: Secondary | ICD-10-CM | POA: Diagnosis present

## 2022-08-21 DIAGNOSIS — R410 Disorientation, unspecified: Secondary | ICD-10-CM | POA: Diagnosis not present

## 2022-08-21 DIAGNOSIS — R7401 Elevation of levels of liver transaminase levels: Secondary | ICD-10-CM | POA: Insufficient documentation

## 2022-08-21 LAB — URINALYSIS, ROUTINE W REFLEX MICROSCOPIC
Bilirubin Urine: NEGATIVE
Glucose, UA: NEGATIVE mg/dL
Ketones, ur: 5 mg/dL — AB
Nitrite: NEGATIVE
Protein, ur: 30 mg/dL — AB
Specific Gravity, Urine: 1.016 (ref 1.005–1.030)
pH: 5 (ref 5.0–8.0)

## 2022-08-21 LAB — COMPREHENSIVE METABOLIC PANEL
ALT: 53 U/L — ABNORMAL HIGH (ref 0–44)
AST: 95 U/L — ABNORMAL HIGH (ref 15–41)
Albumin: 3.8 g/dL (ref 3.5–5.0)
Alkaline Phosphatase: 51 U/L (ref 38–126)
Anion gap: 9 (ref 5–15)
BUN: 25 mg/dL — ABNORMAL HIGH (ref 8–23)
CO2: 23 mmol/L (ref 22–32)
Calcium: 9 mg/dL (ref 8.9–10.3)
Chloride: 102 mmol/L (ref 98–111)
Creatinine, Ser: 1.19 mg/dL — ABNORMAL HIGH (ref 0.44–1.00)
GFR, Estimated: 43 mL/min — ABNORMAL LOW (ref >60)
Glucose, Bld: 98 mg/dL (ref 70–99)
Potassium: 4 mmol/L (ref 3.5–5.1)
Sodium: 134 mmol/L — ABNORMAL LOW (ref 135–145)
Total Bilirubin: 1.4 mg/dL — ABNORMAL HIGH (ref 0.3–1.2)
Total Protein: 6.9 g/dL (ref 6.5–8.1)

## 2022-08-21 LAB — CBC WITH DIFFERENTIAL/PLATELET
Abs Immature Granulocytes: 0.06 10*3/uL (ref 0.00–0.07)
Basophils Absolute: 0 10*3/uL (ref 0.0–0.1)
Basophils Relative: 0 %
Eosinophils Absolute: 0.2 10*3/uL (ref 0.0–0.5)
Eosinophils Relative: 2 %
HCT: 43 % (ref 36.0–46.0)
Hemoglobin: 14.2 g/dL (ref 12.0–15.0)
Immature Granulocytes: 1 %
Lymphocytes Relative: 7 %
Lymphs Abs: 0.5 10*3/uL — ABNORMAL LOW (ref 0.7–4.0)
MCH: 31.1 pg (ref 26.0–34.0)
MCHC: 33 g/dL (ref 30.0–36.0)
MCV: 94.1 fL (ref 80.0–100.0)
Monocytes Absolute: 1.2 10*3/uL — ABNORMAL HIGH (ref 0.1–1.0)
Monocytes Relative: 16 %
Neutro Abs: 5.8 10*3/uL (ref 1.7–7.7)
Neutrophils Relative %: 74 %
Platelets: 152 10*3/uL (ref 150–400)
RBC: 4.57 MIL/uL (ref 3.87–5.11)
RDW: 13.3 % (ref 11.5–15.5)
WBC: 7.9 10*3/uL (ref 4.0–10.5)
nRBC: 0 % (ref 0.0–0.2)

## 2022-08-21 LAB — LACTIC ACID, PLASMA: Lactic Acid, Venous: 1.7 mmol/L (ref 0.5–1.9)

## 2022-08-21 LAB — PROTIME-INR
INR: 1 (ref 0.8–1.2)
Prothrombin Time: 13.5 seconds (ref 11.4–15.2)

## 2022-08-21 LAB — MAGNESIUM: Magnesium: 2.1 mg/dL (ref 1.7–2.4)

## 2022-08-21 LAB — TROPONIN I (HIGH SENSITIVITY)
Troponin I (High Sensitivity): 77 ng/L — ABNORMAL HIGH (ref <18)
Troponin I (High Sensitivity): 67 ng/L — ABNORMAL HIGH (ref <18)

## 2022-08-21 LAB — TYPE AND SCREEN
ABO/RH(D): O POS
Antibody Screen: NEGATIVE

## 2022-08-21 MED ORDER — LACTATED RINGERS IV BOLUS
1000.0000 mL | Freq: Once | INTRAVENOUS | Status: AC
  Administered 2022-08-21: 1000 mL via INTRAVENOUS

## 2022-08-21 MED ORDER — VANCOMYCIN HCL 750 MG/150ML IV SOLN: 750.0000 mg | INTRAVENOUS | Status: DC

## 2022-08-21 MED ORDER — ASPIRIN 81 MG PO CHEW
324.0000 mg | CHEWABLE_TABLET | Freq: Once | ORAL | Status: AC
  Administered 2022-08-21: 324 mg via ORAL
  Filled 2022-08-21: qty 4

## 2022-08-21 MED ORDER — SODIUM CHLORIDE 0.9 % IV SOLN
2.0000 g | Freq: Once | INTRAVENOUS | Status: AC
  Administered 2022-08-21: 2 g via INTRAVENOUS
  Filled 2022-08-21: qty 12.5

## 2022-08-21 MED ORDER — MORPHINE SULFATE (PF) 4 MG/ML IV SOLN
4.0000 mg | Freq: Once | INTRAVENOUS | Status: AC
  Administered 2022-08-21: 4 mg via INTRAVENOUS
  Filled 2022-08-21: qty 1

## 2022-08-21 MED ORDER — ONDANSETRON HCL 4 MG/2ML IJ SOLN
4.0000 mg | Freq: Once | INTRAMUSCULAR | Status: AC
  Administered 2022-08-21: 4 mg via INTRAVENOUS
  Filled 2022-08-21: qty 2

## 2022-08-21 MED ORDER — VANCOMYCIN HCL IN DEXTROSE 1-5 GM/200ML-% IV SOLN
1000.0000 mg | Freq: Once | INTRAVENOUS | Status: AC
  Administered 2022-08-21: 1000 mg via INTRAVENOUS
  Filled 2022-08-21: qty 200

## 2022-08-21 MED ORDER — INFLUENZA VAC A&B SA ADJ QUAD 0.5 ML IM PRSY
0.5000 mL | PREFILLED_SYRINGE | INTRAMUSCULAR | Status: AC
  Administered 2022-08-22: 0.5 mL via INTRAMUSCULAR
  Filled 2022-08-21: qty 0.5

## 2022-08-21 NOTE — Progress Notes (Signed)
Pharmacy Antibiotic Note  Marilyn Hobbs is a 88 y.o. female admitted on 08/21/2022 with  DFI .  Pharmacy has been consulted for Vanco dosing.  Active Problem(s): Falls x 4 times last 24h. Dizziness, weakness. BL leg pain.  PMH: HTN, CKD, hypothyroidism, polycythemia, COPD   ID: DFI. Afebrile. Scr 1.19  Plan: Vanco 1g IV x 1 in ED then Vancomycin 750 mg IV Q 48 hrs. Goal AUC 400-550. Expected AUC: 466 SCr used: 1.19     Height: '5\' 4"'$  (162.6 cm) Weight: 60.8 kg (134 lb) IBW/kg (Calculated) : 54.7  Temp (24hrs), Avg:98.5 F (36.9 C), Min:98.5 F (36.9 C), Max:98.5 F (36.9 C)  Recent Labs  Lab 08/21/22 1947  WBC 7.9  CREATININE 1.19*    Estimated Creatinine Clearance: 26.6 mL/min (A) (by C-G formula based on SCr of 1.19 mg/dL (H)).    No Known Allergies   Fathima Bartl S. Alford Highland, PharmD, BCPS Clinical Staff Pharmacist [email or web address] [facility] 08/21/2022 8:47 PM

## 2022-08-21 NOTE — ED Notes (Signed)
Dtr called with update

## 2022-08-21 NOTE — ED Notes (Signed)
Pt remains in imaging.

## 2022-08-21 NOTE — ED Triage Notes (Signed)
Pt arrives via [facility] from home for 4 falls in the last 24 hours. Pt states she uses a walker and that she feels dizzy and her legs feel stiff which precipitates the falls. Pt c/o bilateral leg pain. Bruising noted over R temple area, but is uncertain of the fall that caused head injury. Pt states she is not on blood thinners. Redness and swelling noted to R toes.

## 2022-08-21 NOTE — H&P (Signed)
History and Physical    Patient: Marilyn Hobbs ION:[id number] DOB: 1929/03/07 DOA: 08/21/2022 DOS: the patient was seen and examined on 08/22/2022 PCP: Janith Lima, MD  Patient coming from: Home  Chief Complaint:  Chief Complaint  Patient presents with   Fall   Leg Pain   HPI: Marilyn Hobbs is a 88 y.o. female with medical history significant of polycythemia vera, hypertension, hypothyroidism, COPD, hyperlipidemia presents with recurrent falls.  Patient is a poor historian.  History obtained from daughter who is her caretaker.  Patient had a total of 4 falls since last night.  First 2 were unwitnessed.  During the first 1 she potentially slipped out of bed and was complaining of right arm pain which daughter thinks is because she was laying on it for some time.  Second fall she was again on the ground in her bedroom and complaining of bilateral leg pain which is new for her.  Daughter called EMS but patient refused to be transported to the hospital.  She then again had 2 more falls while ambulating with her walker. Right great toe nail was ripped off.  Patient does not have a history of frequent falls.  Patient otherwise in her normal state of health.  However daughter reports she has been admitted in the past for severe dehydration and does not like to drink any fluids.  Daughter also concerned about her hygiene since she is incontinent in bed but refused to wear depends or allow her to bathe and change her sheets.  In the ED, she was afebrile normotensive on room air.  No leukocytosis or anemia. Hgb of 14.2. Has AKI with creatinine 1.19.  Troponin also elevated to 77.  CT head and CT cervical spine is negative for any acute findings. CT C/A/P with mild left hydronephrosis and hydroureter of uncertain etiology and right ovarian cyst  Left knee negative for fx but has diffuse SQ edema  Right foot Xray with soft tissue swelling of the first digit with erosive changes concerning for osteomyelitis.  However MRI showed only cellulitis.   She was given IV vancomycin and cefepime in ED. Hospitalist consulted for further admission.    Review of Systems: As mentioned in the history of present illness. All other systems reviewed and are negative. Past Medical History:  Diagnosis Date   COPD (chronic obstructive pulmonary disease) (HCC)    High cholesterol    Hypertension    Hypothyroidism    Polycythemia    Past Surgical History:  Procedure Laterality Date   BACK SURGERY     THYROIDECTOMY     Social History:  reports that she has never smoked. She has been exposed to tobacco smoke. She has never used smokeless tobacco. She reports current alcohol use of about 1.0 standard drink of alcohol per week. She reports that she does not use drugs.  No Known Allergies  Family History  Problem Relation Age of Onset   Breast cancer Mother     Prior to Admission medications   Medication Sig Start Date End Date Taking? Authorizing Provider  albuterol (VENTOLIN HFA) 108 (90 Base) MCG/ACT inhaler Inhale 2 puffs into the lungs every 6 (six) hours as needed for wheezing or shortness of breath. Patient not taking: Reported on 10/09/2021 09/06/21   Binnie Rail, MD  ALPRAZolam Duanne Moron) 0.25 MG tablet TAKE ONE TABLET BY MOUTH ONE TIME DAILY AS NEEDED ANXIETY Patient taking differently: Take 0.25 mg by mouth daily as needed for anxiety. 10/08/21   Ronnald Ramp,  Arvid Right, MD  amLODipine (NORVASC) 2.5 MG tablet TAKE ONE TABLET BY MOUTH ONE TIME DAILY 07/15/22   Janith Lima, MD  aspirin EC 81 MG tablet Take 81 mg by mouth daily. Swallow whole.    [provider]  carboxymethylcellulose (REFRESH PLUS) 0.5 % SOLN Place 1 drop into both eyes 3 (three) times daily as needed (dry eyes).    [provider]  Fluticasone-Umeclidin-Vilant (TRELEGY ELLIPTA) 200-62.5-25 MCG/ACT AEPB Inhale 1 puff into the lungs daily. 10/03/21   Mannam, Hart Robinsons, MD  gabapentin (NEURONTIN) 100 MG capsule Take 1  capsule (100 mg total) by mouth 3 (three) times daily. 01/03/22   Biagio Borg, MD  Multiple Vitamin (MULTIVITAMIN) capsule Take 1 capsule by mouth daily.    [provider]  rosuvastatin (CRESTOR) 10 MG tablet TAKE ONE TABLET BY MOUTH ONE TIME DAILY 07/15/22   Janith Lima, MD  SYNTHROID 25 MCG tablet TAKE ONE TABLET BY MOUTH ONE TIME DAILY BEFORE BREAKFAST 07/12/22   Janith Lima, MD    Physical Exam: Vitals:   08/21/22 1945 08/21/22 2015 08/21/22 2129 08/21/22 2145  BP: (!) 117/49 (!) 136/116  (!) 120/56  Pulse: 93 100  86  Resp: (!) 21 (!) 22  16  Temp:   98.5 F (36.9 C)   TempSrc:   Oral   SpO2: 100% 91%  98%  Weight:      Height:       Constitutional: NAD, calm, comfortable, elderly female laying in bed Eyes:  lids and conjunctivae normal ENMT: Mucous membranes are moist. Neck: normal, supple Respiratory: Diminished breath sounds throughout with faint expiratory wheezing.  Normal respiratory effort. No accessory muscle use.  Cardiovascular: Regular rate and rhythm, no murmurs / rubs / gallops. No extremity edema.  Abdomen: no tenderness, no masses palpated. Bowel sounds positive.  Musculoskeletal: no clubbing / cyanosis. No joint deformity upper and lower extremities.   Skin: Erythematous digits of the right foot worse on the great toe with pain with palpation.  Right great toe with missing toenail and dried blood.    Neurologic: CN 2-12 grossly intact. Strength 5/5 in all 4.  Psychiatric: Normal judgment and insight. Alert and oriented x 3. Normal mood. Data Reviewed:  See HPI  Assessment and Plan: * Cellulitis -cellulitis of the right great toe. MRI right foot was negative for osteomyelitis -Received IV vanc/cefepime in ED. Will switch to IV Ancef  Ovarian cyst Incidental finding of right ovarian cyst on CT abdomen pelvis.  Daughter is made aware that she can pursue pelvic ultrasound outpatient depending on patient's goals of care.  Abnormal  LFTs Mildly elevated AST and ALT. Unclear cause. Will follow repeat in the morning.   Elevated troponin Troponin of 77-67. Likely elevated due to AKI.   Fall at home, initial encounter Recurrent fall possible due to right great toe cellulitis.  -No hx of frequent falls. Normally ambulates with cane or walker -will have PT evaluate  AKI (acute kidney injury) ([place]) Creatinine mildly elevated at 1.19. CT A/P is showing mild left hydronephrosis and hydroureter of uncertain etiology . Will obtain renal ultrasound to further evaluation.  COPD (chronic obstructive pulmonary disease) (HCC) -mild exacerbation -duoneb q6hr PRN -continue home Trelegy   Polycythemia vera (HCC) Hgb stable at 14.   Essential hypertension Continue amlodipine      Advance Care Planning:   Code Status: Full Code Full -per daughter.  Daughter to check her living will records tomorrow to verify.  Consults:  none  Family Communication: Discussed with daughter over the phone  Severity of Illness: The appropriate patient status for this patient is INPATIENT. Inpatient status is judged to be reasonable and necessary in order to provide the required intensity of service to ensure the patient's safety. The patient's presenting symptoms, physical exam findings, and initial radiographic and laboratory data in the context of their chronic comorbidities is felt to place them at high risk for further clinical deterioration. Furthermore, it is not anticipated that the patient will be medically stable for discharge from the hospital within 2 midnights of admission.   * I certify that at the point of admission it is my clinical judgment that the patient will require inpatient hospital care spanning beyond 2 midnights from the point of admission due to high intensity of service, high risk for further deterioration and high frequency of surveillance required.*  Author: Orene Desanctis, DO 08/22/2022 12:46 AM  For on call review  [email or web address].

## 2022-08-21 NOTE — ED Provider Notes (Signed)
[facility] DEPT Provider Note   CSN: [id number] Arrival date & time: 08/21/22  1735     History  Chief Complaint  Patient presents with   Fall   Leg Pain    Marilyn Hobbs is a 88 y.o. female.  With PMH of HTN, CKD, hypothyroidism, polycythemia, COPD who was brought in by EMS from home for 4 falls in the last 24 hours complaining of bilateral leg pain.  Patient says she is having pain in her bilateral legs worse on the right side in the lower legs.  She did note hurting her toenail and toe recently but unsure when.  She typically uses a walker getting around but has been having lightheadedness upon walking and weakness in bilateral legs leading to falls.  She is denying any headache, vomiting, focal weakness, loss of sensation.  She is not on any blood thinners.  She denies being on any recent antibiotics.  Denies any urinary symptoms.  Does note a cough recently with right-sided chest pain and intermittent shortness of breath but no current chest pain or shortness of breath.   Fall  Leg Pain      Home Medications Prior to Admission medications   Medication Sig Start Date End Date Taking? Authorizing Provider  albuterol (VENTOLIN HFA) 108 (90 Base) MCG/ACT inhaler Inhale 2 puffs into the lungs every 6 (six) hours as needed for wheezing or shortness of breath. Patient not taking: Reported on 10/09/2021 09/06/21   Binnie Rail, MD  ALPRAZolam Duanne Moron) 0.25 MG tablet TAKE ONE TABLET BY MOUTH ONE TIME DAILY AS NEEDED ANXIETY Patient taking differently: Take 0.25 mg by mouth daily as needed for anxiety. 10/08/21   Janith Lima, MD  amLODipine (NORVASC) 2.5 MG tablet TAKE ONE TABLET BY MOUTH ONE TIME DAILY 07/15/22   Janith Lima, MD  aspirin EC 81 MG tablet Take 81 mg by mouth daily. Swallow whole.    [provider]  carboxymethylcellulose (REFRESH PLUS) 0.5 % SOLN Place 1 drop into both eyes 3 (three) times daily as needed (dry eyes).     [provider]  Fluticasone-Umeclidin-Vilant (TRELEGY ELLIPTA) 200-62.5-25 MCG/ACT AEPB Inhale 1 puff into the lungs daily. 10/03/21   Mannam, Hart Robinsons, MD  gabapentin (NEURONTIN) 100 MG capsule Take 1 capsule (100 mg total) by mouth 3 (three) times daily. 01/03/22   Biagio Borg, MD  Multiple Vitamin (MULTIVITAMIN) capsule Take 1 capsule by mouth daily.    [provider]  rosuvastatin (CRESTOR) 10 MG tablet TAKE ONE TABLET BY MOUTH ONE TIME DAILY 07/15/22   Janith Lima, MD  SYNTHROID 25 MCG tablet TAKE ONE TABLET BY MOUTH ONE TIME DAILY BEFORE BREAKFAST 07/12/22   Janith Lima, MD      Allergies    Patient has no known allergies.    Review of Systems   Review of Systems  Physical Exam Updated Vital Signs BP (!) 136/116   Pulse 100   Temp 98.5 F (36.9 C) (Oral)   Resp (!) 22   Ht '5\' 4"'$  (1.626 m)   Wt 60.8 kg   SpO2 91%   BMI 23.00 kg/m  Physical Exam Constitutional: Alert and oriented to person, place and year.slightly confused on situation.  Pleasant and slightly uncomfortable continuing to request pain meds for her legs  Eyes: Conjunctivae are normal. ENT      Head: Normocephalic , right forehead old ecchymose       Nose: No congestion.  Mouth/Throat: Mucous membranes are dry.      Neck/spine: No stridor.  No midline tenderness step-offs or deformities of the C/T/L-spine Cardiovascular: S1, S2, borderline tachycardic, regular rhythm, equal palpable radial and DP pulses Respiratory: Normal respiratory effort. Breath sounds are normal.  Mild right-sided anterior chest wall tenderness Gastrointestinal: Soft and nontender.  Musculoskeletal: Normal range of motion in all extremities. Tenderness to bilateral shins with associated erythema and warmth but no active discharge, trace pitting edema with tenderness to palpation, right great toe with toenail injury and bleeding with erythema and warmth surrounding the toe no active discharge Neurologic: Normal  speech and language.  Moving all extremities equally.  No facial droop.  EOMI.  Pupils equal round and reactive to light.  Tongue midline.  Sensation grossly intact.  No gross focal neurologic deficits are appreciated. Skin: Skin is warm, dry.  Erythema and warmth to bilateral shins Psychiatric: Mood and affect are normal. Speech and behavior are normal.  ED Results / Procedures / Treatments   Labs (all labs ordered are listed, but only abnormal results are displayed) Labs Reviewed  COMPREHENSIVE METABOLIC PANEL - Abnormal; Notable for the following components:      Result Value   Sodium 134 (*)    BUN 25 (*)    Creatinine, Ser 1.19 (*)    AST 95 (*)    ALT 53 (*)    Total Bilirubin 1.4 (*)    GFR, Estimated 43 (*)    All other components within normal limits  CBC WITH DIFFERENTIAL/PLATELET - Abnormal; Notable for the following components:   Lymphs Abs 0.5 (*)    Monocytes Absolute 1.2 (*)    All other components within normal limits  URINALYSIS, ROUTINE W REFLEX MICROSCOPIC - Abnormal; Notable for the following components:   APPearance CLOUDY (*)    Hgb urine dipstick MODERATE (*)    Ketones, ur 5 (*)    Protein, ur 30 (*)    Leukocytes,Ua SMALL (*)    Bacteria, UA RARE (*)    All other components within normal limits  TROPONIN I (HIGH SENSITIVITY) - Abnormal; Notable for the following components:   Troponin I (High Sensitivity) 77 (*)    All other components within normal limits  CULTURE, BLOOD (ROUTINE X 2)  CULTURE, BLOOD (ROUTINE X 2)  PROTIME-INR  MAGNESIUM  LACTIC ACID, PLASMA  LACTIC ACID, PLASMA  TYPE AND SCREEN  TROPONIN I (HIGH SENSITIVITY)    EKG EKG Interpretation  Date/Time:  Wednesday August 21 2022 18:29:08 EDT Ventricular Rate:  97 PR Interval:  159 QRS Duration: 92 QT Interval:  374 QTC Calculation: 476 R Axis:   85 Text Interpretation: Sinus tachycardia Paired ventricular premature complexes Borderline right axis deviation Confirmed by Georgina Snell [phone]) on 08/21/2022 6:37:57 PM  Radiology DG Foot Complete Right  Result Date: 08/21/2022 CLINICAL DATA:  Right toe infection, multiple falls EXAM: RIGHT FOOT COMPLETE - 3+ VIEW COMPARISON:  None Available. FINDINGS: Frontal, oblique, and lateral views of the right foot are obtained. Bones are diffusely osteopenic. No acute displaced fracture. Erosive changes are seen medial margin base of the first distal phalanx concerning for osteomyelitis. There is overlying soft tissue swelling. There is severe multifocal osteoarthritis greatest at the first tarsometatarsal and first metatarsophalangeal joints. Small inferior calcaneal spur. Diffuse soft tissue edema. IMPRESSION: 1. Soft tissue swelling of the first digit, with erosive changes medial aspect base of the first distal phalanx concerning for osteomyelitis. 2. No acute fracture. 3. Diffuse soft tissue swelling.  4. Severe multifocal osteoarthritis greatest in the first digit. Electronically Signed   By: Randa Ngo M.D.   On: 08/21/2022 19:44   DG Knee 2 Views Left  Result Date: 08/21/2022 CLINICAL DATA:  Bilateral leg pain, multiple falls EXAM: LEFT KNEE - 1-2 VIEW COMPARISON:  None Available. FINDINGS: Frontal and lateral views of the left knee are obtained. No evidence of acute displaced fracture, subluxation, or dislocation. There is mild 3 compartmental osteoarthritis. Ossification is seen within the lateral collateral ligament complex at the origin on the lateral femoral condyle. This may be sequela from chronic injury. Mild diffuse subcutaneous edema. No joint effusion. IMPRESSION: 1. No acute fracture. 2. Ossification within the LCL complex, likely sequela from chronic trauma. 3. Diffuse subcutaneous edema. 4. Mild 3 compartmental osteoarthritis. Electronically Signed   By: Randa Ngo M.D.   On: 08/21/2022 19:43   CT CHEST ABDOMEN PELVIS WO CONTRAST  Result Date: 08/21/2022 CLINICAL DATA:  Multiple falls over the last 24 hours,  dizziness, bilateral leg pain EXAM: CT CHEST, ABDOMEN AND PELVIS WITHOUT CONTRAST TECHNIQUE: Multidetector CT imaging of the chest, abdomen and pelvis was performed following the standard protocol without IV contrast. Examination was performed without intravenous contrast at the request of the ordering clinician. Evaluation of the soft tissues, solid viscera, vascular structures is limited without IV contrast, especially in the setting of trauma. RADIATION DOSE REDUCTION: This exam was performed according to the departmental dose-optimization program which includes automated exposure control, adjustment of the mA and/or kV according to patient size and/or use of iterative reconstruction technique. COMPARISON:  None Available. FINDINGS: CT CHEST FINDINGS Cardiovascular: Unenhanced imaging of the heart is unremarkable without pericardial effusion. Normal caliber of the thoracic aorta. Atherosclerosis of the aorta and coronary vasculature. Mediastinum/Nodes: No enlarged mediastinal, hilar, or axillary lymph nodes. Thyroid gland, trachea, and esophagus demonstrate no significant findings. Small hiatal hernia. Lungs/Pleura: No acute airspace disease, effusion, or pneumothorax. Central airways are patent. Musculoskeletal: No acute displaced fracture. Reconstructed images demonstrate no additional findings. CT ABDOMEN PELVIS FINDINGS Hepatobiliary: Indeterminate 2.3 cm hypodensity right lobe liver image 47/3. No biliary duct dilation. The gallbladder is unremarkable. Pancreas: Unremarkable unenhanced appearance. Spleen: Unremarkable unenhanced appearance. Adrenals/Urinary Tract: No evidence of urinary tract calculi. Mild left hydronephrosis and hydroureter of uncertain etiology. Right kidney is otherwise unremarkable. The adrenals are grossly normal. The bladder is moderately distended without filling defect. Stomach/Bowel: No bowel obstruction or ileus. Normal appendix right lower quadrant. Diverticulosis of the distal  colon without diverticulitis. No bowel wall thickening or inflammatory change. Vascular/Lymphatic: Aortic atherosclerosis. No enlarged abdominal or pelvic lymph nodes. Reproductive: Uterus is atrophic. There is a unilocular right ovarian cyst measuring 6.9 x 6.8 x 7.7 cm. No left adnexal masses. Other: No free fluid or free intraperitoneal gas. No abdominal wall hernia. Musculoskeletal: No acute displaced fracture. Prior L1 compression fracture with previous vertebral augmentation. Reconstructed images demonstrate no additional findings. IMPRESSION: 1. No acute intrathoracic, intra-abdominal, or intrapelvic trauma on this unenhanced exam. 2. 7.7 cm right ovarian simple-appearing cyst, not adequately characterized. Recommend follow-up pelvic ultrasound if the patient would be a therapy candidate should neoplasm be detected. Reference: JACR 2020 Feb;17(2):248-254 3. Indeterminate 2.3 cm right lobe liver hypodensity. Definitive characterization with nonemergent outpatient dedicated liver CT or MRI with and without contrast could be performed if clinically indicated given patient age. 4. Mild left-sided hydronephrosis and proximal hydroureter without clear etiology. No evidence of urinary tract calculus. 5. Small hiatal hernia. 6. Distal colonic diverticulosis without diverticulitis. 7.  Aortic Atherosclerosis (ICD10-I70.0). Electronically Signed   By: Randa Ngo M.D.   On: 08/21/2022 19:41   CT Head Wo Contrast  Result Date: 08/21/2022 CLINICAL DATA:  Multiple recent falls.  Dizziness. EXAM: CT HEAD WITHOUT CONTRAST CT CERVICAL SPINE WITHOUT CONTRAST TECHNIQUE: Multidetector CT imaging of the head and cervical spine was performed following the standard protocol without intravenous contrast. Multiplanar CT image reconstructions of the cervical spine were also generated. RADIATION DOSE REDUCTION: This exam was performed according to the departmental dose-optimization program which includes automated exposure  control, adjustment of the mA and/or kV according to patient size and/or use of iterative reconstruction technique. COMPARISON:  Head CT 10/08/2021 FINDINGS: CT HEAD FINDINGS Brain: There is no evidence of an acute infarct, intracranial hemorrhage, mass, midline shift, or extra-axial fluid collection. Hypodensities in the cerebral white matter bilaterally are unchanged and nonspecific but compatible with mild chronic small vessel ischemic disease. Cerebral atrophy is within normal limits for age. Vascular: Calcified atherosclerosis at the skull base. No hyperdense vessel. Skull: No acute fracture or suspicious osseous lesion. Sinuses/Orbits: Mild mucosal thickening in multiple left ethmoid air cells. Partially visualized mild mucosal thickening and small volume fluid in the left maxillary sinus. Clear mastoid air cells. Right cataract extraction. Other: None. CT CERVICAL SPINE FINDINGS The study is motion degraded, including moderate to severe motion artifact from C2-C4. Alignment: 3 mm anterolisthesis of C7 on T1, likely degenerative and facet mediated. Skull base and vertebrae: No acute fracture is identified, however assessment (particularly for nondisplaced fractures) is limited by the degree of motion artifact. Soft tissues and spinal canal: No prevertebral fluid or swelling. No visible canal hematoma. Disc levels: Advanced disc space narrowing from C4-5 through C7-T1. Multilevel facet arthrosis, most severe at C3-4. Upper chest: Reported on separate chest CT. Other: Mild calcific atherosclerosis at the carotid bifurcations. IMPRESSION: 1. No evidence of acute intracranial abnormality. 2. Motion degraded cervical spine CT. No acute displaced cervical spine fracture identified. Electronically Signed   By: Logan Bores M.D.   On: 08/21/2022 19:40   CT Cervical Spine Wo Contrast  Result Date: 08/21/2022 CLINICAL DATA:  Multiple recent falls.  Dizziness. EXAM: CT HEAD WITHOUT CONTRAST CT CERVICAL SPINE WITHOUT  CONTRAST TECHNIQUE: Multidetector CT imaging of the head and cervical spine was performed following the standard protocol without intravenous contrast. Multiplanar CT image reconstructions of the cervical spine were also generated. RADIATION DOSE REDUCTION: This exam was performed according to the departmental dose-optimization program which includes automated exposure control, adjustment of the mA and/or kV according to patient size and/or use of iterative reconstruction technique. COMPARISON:  Head CT 10/08/2021 FINDINGS: CT HEAD FINDINGS Brain: There is no evidence of an acute infarct, intracranial hemorrhage, mass, midline shift, or extra-axial fluid collection. Hypodensities in the cerebral white matter bilaterally are unchanged and nonspecific but compatible with mild chronic small vessel ischemic disease. Cerebral atrophy is within normal limits for age. Vascular: Calcified atherosclerosis at the skull base. No hyperdense vessel. Skull: No acute fracture or suspicious osseous lesion. Sinuses/Orbits: Mild mucosal thickening in multiple left ethmoid air cells. Partially visualized mild mucosal thickening and small volume fluid in the left maxillary sinus. Clear mastoid air cells. Right cataract extraction. Other: None. CT CERVICAL SPINE FINDINGS The study is motion degraded, including moderate to severe motion artifact from C2-C4. Alignment: 3 mm anterolisthesis of C7 on T1, likely degenerative and facet mediated. Skull base and vertebrae: No acute fracture is identified, however assessment (particularly for nondisplaced fractures) is limited by the degree  of motion artifact. Soft tissues and spinal canal: No prevertebral fluid or swelling. No visible canal hematoma. Disc levels: Advanced disc space narrowing from C4-5 through C7-T1. Multilevel facet arthrosis, most severe at C3-4. Upper chest: Reported on separate chest CT. Other: Mild calcific atherosclerosis at the carotid bifurcations. IMPRESSION: 1. No  evidence of acute intracranial abnormality. 2. Motion degraded cervical spine CT. No acute displaced cervical spine fracture identified. Electronically Signed   By: Logan Bores M.D.   On: 08/21/2022 19:40    Procedures .Critical Care  Performed by: Elgie Congo, MD Authorized by: Elgie Congo, MD   Critical care provider statement:    Critical care time (minutes):  35   Critical care was necessary to treat or prevent imminent or life-threatening deterioration of the following conditions:  Sepsis   Critical care was time spent personally by me on the following activities:  Development of treatment plan with patient or surrogate, discussions with consultants, evaluation of patient's response to treatment, examination of patient, ordering and review of laboratory studies, ordering and review of radiographic studies, ordering and performing treatments and interventions, pulse oximetry, re-evaluation of patient's condition, review of old charts and obtaining history from patient or surrogate   Care discussed with: admitting provider     Remain on constant cardiac monitoring, normal sinus rhythm.  Medications Ordered in ED Medications  vancomycin (VANCOCIN) IVPB 1000 mg/200 mL premix (has no administration in time range)  vancomycin (VANCOREADY) IVPB 750 mg/150 mL (has no administration in time range)  aspirin chewable tablet 324 mg (has no administration in time range)  lactated ringers bolus 1,000 mL (has no administration in time range)  influenza vaccine adjuvanted (FLUAD) injection 0.5 mL (has no administration in time range)  morphine (PF) 4 MG/ML injection 4 mg (4 mg Intravenous Given 08/21/22 1946)  ondansetron (ZOFRAN) injection 4 mg (4 mg Intravenous Given 08/21/22 1945)  ceFEPIme (MAXIPIME) 2 g in sodium chloride 0.9 % 100 mL IVPB (0 g Intravenous Stopped 08/21/22 2113)    ED Course/ Medical Decision Making/ A&P Clinical Course as of 08/21/22 2130  Wed Aug 21, 2022   2129 Discussed case with hospitalist Dr. Flossie Buffy who will put in orders for admission for further work-up and management. [VB]    Clinical Course User Index [VB] Elgie Congo, MD                           Medical Decision Making Marilyn Hobbs is a 88 y.o. female.  With PMH of HTN, CKD, hypothyroidism, polycythemia, COPD who was brought in by EMS from home for 4 falls in the last 24 hours complaining of bilateral leg pain.  Patient with recurrent falls in the setting of lightheadedness and generalized weakness. Suspect patient's recurrent falls likely secondary to underlying infection. She continues to complain of bilateral leg pain worse on right which are erythematous warm and tender to the touch concerning for cellulitis especially for underlying osteomyelitis of her right toe which shows previous injury.  She has palpable pulses, no concern for ischemic limb.  An x-ray of her right foot suggested osteomyelitis.  Additionally, urine suggested UTI.  Overall, vital signs stable and not suggestive of sepsis with a normal white blood cell count 7.9.  However I did send blood cultures and lactate.  She has been covered with IV broad-spectrum antibiotics and I have ordered for MRI of the right foot for further evaluation of osteomyelitis.  I have ordered  for IV fluids as well with a AKI creatinine 1.19 likely prerenal in nature with elevated BUN.  Additionally, she has a mildly elevated troponin 77, her EKG shows no acute ST/T changes and she is denying any chest pain, suspect this is demand ischemia in the setting of dehydration and infection.  CT scans were obtained of the head, C-spine chest abdomen and pelvis as well as plain films of the left knee and right foot to further evaluate for traumatic injury.  There were notes no evidence of acute traumatic injuries or acute findings.  Plan to admit patient for further work-up and management of concern for osteomyelitis of the right foot as well as  UTI and concurrent dehydration leading to recurrent falls.  Amount and/or Complexity of Data Reviewed Labs: ordered. Radiology: ordered.  Risk OTC drugs. Prescription drug management. Decision regarding hospitalization.    Final Clinical Impression(s) / ED Diagnoses Final diagnoses:  Fall, initial encounter  Pain in both lower extremities  Osteomyelitis of right foot, unspecified type [facility])    Rx / DC Orders ED Discharge Orders     None         Elgie Congo, MD 08/21/22 2130

## 2022-08-21 NOTE — ED Notes (Signed)
Patient transported to MRI 

## 2022-08-22 ENCOUNTER — Inpatient Hospital Stay (HOSPITAL_COMMUNITY): Payer: Medicare Other

## 2022-08-22 DIAGNOSIS — I5A Non-ischemic myocardial injury (non-traumatic): Secondary | ICD-10-CM | POA: Insufficient documentation

## 2022-08-22 DIAGNOSIS — Y92009 Unspecified place in unspecified non-institutional (private) residence as the place of occurrence of the external cause: Secondary | ICD-10-CM

## 2022-08-22 DIAGNOSIS — U071 COVID-19: Secondary | ICD-10-CM | POA: Insufficient documentation

## 2022-08-22 DIAGNOSIS — J069 Acute upper respiratory infection, unspecified: Secondary | ICD-10-CM

## 2022-08-22 DIAGNOSIS — N83209 Unspecified ovarian cyst, unspecified side: Secondary | ICD-10-CM

## 2022-08-22 DIAGNOSIS — L03031 Cellulitis of right toe: Secondary | ICD-10-CM | POA: Diagnosis not present

## 2022-08-22 DIAGNOSIS — R7401 Elevation of levels of liver transaminase levels: Secondary | ICD-10-CM | POA: Insufficient documentation

## 2022-08-22 LAB — RESPIRATORY PANEL BY PCR

## 2022-08-22 LAB — COMPREHENSIVE METABOLIC PANEL
ALT: 52 U/L — ABNORMAL HIGH (ref 0–44)
AST: 100 U/L — ABNORMAL HIGH (ref 15–41)
Albumin: 3.2 g/dL — ABNORMAL LOW (ref 3.5–5.0)
Alkaline Phosphatase: 43 U/L (ref 38–126)
Anion gap: 8 (ref 5–15)
BUN: 22 mg/dL (ref 8–23)
CO2: 25 mmol/L (ref 22–32)
Calcium: 8.9 mg/dL (ref 8.9–10.3)
Chloride: 104 mmol/L (ref 98–111)
Creatinine, Ser: 0.99 mg/dL (ref 0.44–1.00)
GFR, Estimated: 54 mL/min — ABNORMAL LOW (ref >60)
Glucose, Bld: 81 mg/dL (ref 70–99)
Potassium: 4.6 mmol/L (ref 3.5–5.1)
Sodium: 137 mmol/L (ref 135–145)
Total Bilirubin: 1.5 mg/dL — ABNORMAL HIGH (ref 0.3–1.2)
Total Protein: 6 g/dL — ABNORMAL LOW (ref 6.5–8.1)

## 2022-08-22 LAB — ABO/RH: ABO/RH(D): O POS

## 2022-08-22 LAB — RESP PANEL BY RT-PCR (FLU A&B, COVID) ARPGX2
Influenza A by PCR: NEGATIVE
Influenza B by PCR: NEGATIVE
SARS Coronavirus 2 by RT PCR: POSITIVE — AB

## 2022-08-22 MED ORDER — AMLODIPINE BESYLATE 5 MG PO TABS
2.5000 mg | ORAL_TABLET | Freq: Every day | ORAL | Status: DC
  Administered 2022-08-22 – 2022-08-26 (×5): 2.5 mg via ORAL
  Filled 2022-08-22 (×5): qty 1

## 2022-08-22 MED ORDER — ENOXAPARIN SODIUM 30 MG/0.3ML IJ SOSY
30.0000 mg | PREFILLED_SYRINGE | INTRAMUSCULAR | Status: DC
  Administered 2022-08-22 – 2022-09-02 (×12): 30 mg via SUBCUTANEOUS
  Filled 2022-08-22 (×12): qty 0.3

## 2022-08-22 MED ORDER — ROSUVASTATIN CALCIUM 20 MG PO TABS: 10.0000 mg | ORAL_TABLET | Freq: Every day | ORAL | Status: DC

## 2022-08-22 MED ORDER — NIRMATRELVIR/RITONAVIR (PAXLOVID)TABLET
3.0000 | ORAL_TABLET | Freq: Two times a day (BID) | ORAL | Status: AC
  Administered 2022-08-22 – 2022-08-27 (×10): 3 via ORAL
  Filled 2022-08-22: qty 30

## 2022-08-22 MED ORDER — UMECLIDINIUM BROMIDE 62.5 MCG/ACT IN AEPB
1.0000 | INHALATION_SPRAY | Freq: Every day | RESPIRATORY_TRACT | Status: DC
  Administered 2022-08-23: 1 via RESPIRATORY_TRACT
  Filled 2022-08-22: qty 7

## 2022-08-22 MED ORDER — CEFAZOLIN SODIUM-DEXTROSE 2-4 GM/100ML-% IV SOLN
2.0000 g | Freq: Two times a day (BID) | INTRAVENOUS | Status: DC
  Administered 2022-08-22: 2 g via INTRAVENOUS
  Filled 2022-08-22: qty 100

## 2022-08-22 MED ORDER — LACTATED RINGERS IV SOLN: INTRAVENOUS | Status: AC

## 2022-08-22 MED ORDER — FLUTICASONE FUROATE-VILANTEROL 200-25 MCG/ACT IN AEPB
1.0000 | INHALATION_SPRAY | Freq: Every day | RESPIRATORY_TRACT | Status: DC
  Administered 2022-08-23 – 2022-09-02 (×11): 1 via RESPIRATORY_TRACT
  Filled 2022-08-22: qty 28

## 2022-08-22 MED ORDER — IPRATROPIUM-ALBUTEROL 0.5-2.5 (3) MG/3ML IN SOLN
3.0000 mL | Freq: Four times a day (QID) | RESPIRATORY_TRACT | Status: DC | PRN
  Administered 2022-08-25 – 2022-08-30 (×3): 3 mL via RESPIRATORY_TRACT
  Filled 2022-08-22 (×4): qty 3

## 2022-08-22 MED ORDER — CEFAZOLIN SODIUM-DEXTROSE 2-4 GM/100ML-% IV SOLN
2.0000 g | Freq: Three times a day (TID) | INTRAVENOUS | Status: DC
  Administered 2022-08-22 – 2022-08-23 (×2): 2 g via INTRAVENOUS
  Filled 2022-08-22 (×3): qty 100

## 2022-08-22 MED ORDER — LEVOTHYROXINE SODIUM 25 MCG PO TABS
25.0000 ug | ORAL_TABLET | Freq: Every day | ORAL | Status: DC
  Administered 2022-08-22 – 2022-09-02 (×12): 25 ug via ORAL
  Filled 2022-08-22 (×12): qty 1

## 2022-08-22 MED ORDER — ASPIRIN 81 MG PO TBEC
81.0000 mg | DELAYED_RELEASE_TABLET | Freq: Every day | ORAL | Status: DC
  Administered 2022-08-22 – 2022-09-02 (×12): 81 mg via ORAL
  Filled 2022-08-22 (×12): qty 1

## 2022-08-22 MED ORDER — TRAMADOL HCL 50 MG PO TABS
50.0000 mg | ORAL_TABLET | Freq: Four times a day (QID) | ORAL | Status: DC | PRN
  Administered 2022-08-22 – 2022-08-24 (×5): 50 mg via ORAL
  Filled 2022-08-22 (×5): qty 1

## 2022-08-22 NOTE — Assessment & Plan Note (Addendum)
Due to COVID

## 2022-08-22 NOTE — Assessment & Plan Note (Addendum)
AKI ruled out.  Creatinine mildly elevated at 1.19. CT A/P is showing mild left hydronephrosis and hydroureter of uncertain etiology . Will obtain renal ultrasound to further evaluation.

## 2022-08-22 NOTE — Assessment & Plan Note (Signed)
Hgb stable at 14.

## 2022-08-22 NOTE — Plan of Care (Signed)

## 2022-08-22 NOTE — Assessment & Plan Note (Addendum)
BP normal - Continue amlodipine

## 2022-08-22 NOTE — ED Notes (Signed)
2nd Request for purple man at 1330.

## 2022-08-22 NOTE — Assessment & Plan Note (Signed)
Incidental finding of right ovarian cyst on CT abdomen pelvis.  Daughter is made aware that she can pursue pelvic ultrasound outpatient depending on patient's goals of care.

## 2022-08-22 NOTE — Assessment & Plan Note (Addendum)
Improving today.  Due to COVID

## 2022-08-22 NOTE — Progress Notes (Signed)
Pharmacy Antibiotic Note  Marilyn Hobbs is a 88 y.o. female admitted on 08/21/2022 with medical history significant of polycythemia vera, hypertension, hypothyroidism, COPD, hyperlipidemia presents with recurrent falls..  Right foot Xray with soft tissue swelling of the first digit with erosive changes concerning for osteomyelitis. However MRI showed only cellulitis Pharmacy has been consulted to dose ancef for cellulitis 08/22/2022 SCr 1.19> 0.99, CrCl 32 ml/min   Plan: Increase to Ancef 2gm IV q8 h Follow renal function, cultures and clinical course   Height: '5\' 4"'$  (162.6 cm) Weight: 60.8 kg (134 lb) IBW/kg (Calculated) : 54.7  Temp (24hrs), Avg:98.3 F (36.8 C), Min:97.9 F (36.6 C), Max:98.6 F (37 C)  Recent Labs  Lab 08/21/22 1947 08/21/22 2036 08/22/22 0540  WBC 7.9  --   --   CREATININE 1.19*  --  0.99  LATICACIDVEN  --  1.7  --      Estimated Creatinine Clearance: 32 mL/min (by C-G formula based on SCr of 0.99 mg/dL).    No Known Allergies   Thank you for allowing pharmacy to be a part of this patient's care.  Eudelia Bunch, Pharm.D 08/22/2022 12:51 PM

## 2022-08-22 NOTE — Progress Notes (Signed)
Pharmacy Antibiotic Note  Marilyn Hobbs is a 88 y.o. female admitted on 08/21/2022 with medical history significant of polycythemia vera, hypertension, hypothyroidism, COPD, hyperlipidemia presents with recurrent falls..  Right foot Xray with soft tissue swelling of the first digit with erosive changes concerning for osteomyelitis. However MRI showed only cellulitis Pharmacy has been consulted to dose ancef for cellulitis  Plan: Ancef 2gm IV q12h Follow renal function, cultures and clinical course   Height: '5\' 4"'$  (162.6 cm) Weight: 60.8 kg (134 lb) IBW/kg (Calculated) : 54.7  Temp (24hrs), Avg:98.5 F (36.9 C), Min:98.5 F (36.9 C), Max:98.5 F (36.9 C)  Recent Labs  Lab 08/21/22 1947 08/21/22 2036  WBC 7.9  --   CREATININE 1.19*  --   LATICACIDVEN  --  1.7    Estimated Creatinine Clearance: 26.6 mL/min (A) (by C-G formula based on SCr of 1.19 mg/dL (H)).    No Known Allergies   Thank you for allowing pharmacy to be a part of this patient's care.  Dolly Rias RPh 08/22/2022, 12:50 AM

## 2022-08-22 NOTE — Hospital Course (Addendum)
Marilyn Hobbs is a 88 y.o. F with PV, HTN, hypothyroidism, COPD not on O2 and hyperlipidemia who presented with weakness and falls.  In the ER, noted to have cellulitis of the right great toe.  MRI negative for osteomyelitis.   10/4: Admitted on antibiotics 10/5: Tested positive for COVID 10/6: Antibiotics stopped

## 2022-08-22 NOTE — Assessment & Plan Note (Deleted)
-   Continue ICS/LABA/LAMA

## 2022-08-22 NOTE — Assessment & Plan Note (Addendum)
Troponin of 77-67. No chest pain or ECG changes, myocardial injury due to concurrent illness, no further work up required.

## 2022-08-22 NOTE — ED Notes (Signed)
Request for purple man at 1300.

## 2022-08-22 NOTE — Assessment & Plan Note (Addendum)
Patient presented with malaise, weakness/fatigue, sore throat and cough.  There was some initial concern that her fever and weakness were related to cellulitis, but she reports that her symptoms started before she stubbed her toe and it became red.  In the hospital, COVID testing positive -Continue Paxlovid, day 2 of 5 - Stop antibiotics - PT eval

## 2022-08-22 NOTE — Progress Notes (Signed)
  Progress Note   Patient: Marilyn Hobbs HUT:[id number] DOB: [birth year] DOA: 08/21/2022     1 DOS: the patient was seen and examined on 08/22/2022 at 10:20AM      Brief hospital course: Mrs. Funderburke is a 88 y.o. F with PV, HTN, hypothyroidism, COPD not on O2 and hyperlipidemia who presented with weakness and falls.  In the ER, noted to have cellulitis of the right great toe.  MRI negative for osteomyelitis.   10/4: Admitted on antibiotics     Assessment and Plan: * Cellulitis Patient doesn't think this is infected, thinks it is red and swollen because she stubbed her toe after falling ill with URI. - Continue IV fluids, antibiotics   URI (upper respiratory infection) Patient notes weakness, malaise, sore throat, cough - Check COVID, RVP, Flu  Ovarian cyst Incidental finding of right ovarian cyst on CT abdomen pelvis.  Daughter is made aware that she can pursue pelvic ultrasound outpatient depending on patient's goals of care.  Transaminitis Mildly elevated AST and ALT - Cehck RVP  Myocardial injury Troponin of 77-67. No chest pain or ECG changes, myocardial injury due to concurrent illness, no further work up required.  Fall at home, initial encounter Recurrent fall possible due to right great toe cellulitis possibly due to URI.   No hx of frequent falls. Normally ambulates with cane or walker - Check TSH - PT eval  Stage 3a chronic kidney disease (CKD) ([place]) AKI ruled out.  Creatinine mildly elevated at 1.19. CT A/P is showing mild left hydronephrosis and hydroureter of uncertain etiology . Will obtain renal ultrasound to further evaluation.  COPD (chronic obstructive pulmonary disease) (HCC) - Continue ICS/LABA/LAMA  Polycythemia vera (HCC) Hgb stable at 14.   Essential hypertension BP normal - Continue amlodipine          Subjective: Sore throat, cough, malaise.  Pain in toes is tolerable.  No confusion.      Physical Exam: BP 127/61   Pulse 72   Temp  98.6 F (37 C) (Oral)   Resp 15   Ht '5\' 4"'$  (1.626 m)   Wt 60.8 kg   SpO2 98%   BMI 23.00 kg/m   Thin elderly female, lying in bed, no acute distress RRR, no murmurs, no peripheral edema Respiratory rate normal, lungs clear without rales or wheezes Abdomen soft without tenderness palpation or guarding, no ascites or distention There is mild redness of the second digit of the right foot, there is some swelling, hard to know if this is cellulitis or just inflammation from contusion - Chronic venous stasis changes of both lower extremities Attention normal, affect appropriate, judgment and insight appear normal    Data Reviewed: Troponin mildly elevated but flat INR normal Sodium normal Creatinine normal Lactate normal LFTs slightly up Total bilirubin 1.5 CT abdomen pelvis unremarkable MRI rules out fracture or osteomyelitis    Family Communication: Daughter by phone   Disposition: Status is: Inpatient        Author: Edwin Dada, MD 08/22/2022 2:20 PM  For on call review [email or web address].

## 2022-08-22 NOTE — Assessment & Plan Note (Addendum)
Patient doesn't think this is infected, thinks it is red and swollen because she stubbed her toe after falling ill with URI. - Continue IV fluids, antibiotics

## 2022-08-23 DIAGNOSIS — E039 Hypothyroidism, unspecified: Secondary | ICD-10-CM

## 2022-08-23 DIAGNOSIS — J441 Chronic obstructive pulmonary disease with (acute) exacerbation: Secondary | ICD-10-CM

## 2022-08-23 DIAGNOSIS — W19XXXA Unspecified fall, initial encounter: Secondary | ICD-10-CM | POA: Diagnosis not present

## 2022-08-23 DIAGNOSIS — R410 Disorientation, unspecified: Secondary | ICD-10-CM | POA: Diagnosis not present

## 2022-08-23 DIAGNOSIS — U071 COVID-19: Secondary | ICD-10-CM

## 2022-08-23 LAB — COMPREHENSIVE METABOLIC PANEL
ALT: 45 U/L — ABNORMAL HIGH (ref 0–44)
AST: 82 U/L — ABNORMAL HIGH (ref 15–41)
Albumin: 2.8 g/dL — ABNORMAL LOW (ref 3.5–5.0)
Alkaline Phosphatase: 41 U/L (ref 38–126)
Anion gap: 8 (ref 5–15)
BUN: 21 mg/dL (ref 8–23)
CO2: 26 mmol/L (ref 22–32)
Calcium: 8.4 mg/dL — ABNORMAL LOW (ref 8.9–10.3)
Chloride: 102 mmol/L (ref 98–111)
Creatinine, Ser: 1.2 mg/dL — ABNORMAL HIGH (ref 0.44–1.00)
GFR, Estimated: 43 mL/min — ABNORMAL LOW (ref >60)
Glucose, Bld: 92 mg/dL (ref 70–99)
Potassium: 4.1 mmol/L (ref 3.5–5.1)
Sodium: 136 mmol/L (ref 135–145)
Total Bilirubin: 1 mg/dL (ref 0.3–1.2)
Total Protein: 5.5 g/dL — ABNORMAL LOW (ref 6.5–8.1)

## 2022-08-23 LAB — CBC
HCT: 38.4 % (ref 36.0–46.0)
Hemoglobin: 12.4 g/dL (ref 12.0–15.0)
MCH: 30.9 pg (ref 26.0–34.0)
MCHC: 32.3 g/dL (ref 30.0–36.0)
MCV: 95.8 fL (ref 80.0–100.0)
Platelets: 121 10*3/uL — ABNORMAL LOW (ref 150–400)
RBC: 4.01 MIL/uL (ref 3.87–5.11)
RDW: 13.3 % (ref 11.5–15.5)
WBC: 5.2 10*3/uL (ref 4.0–10.5)
nRBC: 0 % (ref 0.0–0.2)

## 2022-08-23 LAB — TSH: TSH: 11.625 u[IU]/mL — ABNORMAL HIGH (ref 0.350–4.500)

## 2022-08-23 MED ORDER — IPRATROPIUM-ALBUTEROL 0.5-2.5 (3) MG/3ML IN SOLN
3.0000 mL | Freq: Three times a day (TID) | RESPIRATORY_TRACT | Status: DC
  Administered 2022-08-23 – 2022-08-24 (×5): 3 mL via RESPIRATORY_TRACT
  Filled 2022-08-23 (×5): qty 3

## 2022-08-23 MED ORDER — PREDNISONE 20 MG PO TABS
40.0000 mg | ORAL_TABLET | Freq: Every day | ORAL | Status: AC
  Administered 2022-08-24 – 2022-08-28 (×5): 40 mg via ORAL
  Filled 2022-08-23 (×5): qty 2

## 2022-08-23 MED ORDER — PHENOL 1.4 % MT LIQD
1.0000 | OROMUCOSAL | Status: DC | PRN
  Administered 2022-08-23: 1 via OROMUCOSAL
  Filled 2022-08-23: qty 177

## 2022-08-23 MED ORDER — CEFADROXIL 500 MG PO CAPS
500.0000 mg | ORAL_CAPSULE | Freq: Two times a day (BID) | ORAL | Status: DC
  Administered 2022-08-23: 500 mg via ORAL
  Filled 2022-08-23: qty 1

## 2022-08-23 NOTE — Assessment & Plan Note (Signed)
She has some disorganized thinking, probably very mild delirium given covid.  No targeted therapy would be helpful.

## 2022-08-23 NOTE — Assessment & Plan Note (Signed)
TSH elevated slightly, suspect this is related to her COVID - Continue levothyroxine - Repeat TSH in 1 week after recovery, titrate LT4 dose if still elevated

## 2022-08-23 NOTE — Plan of Care (Signed)

## 2022-08-23 NOTE — Progress Notes (Signed)
Progress Note   Patient: Marilyn Hobbs ZOX:[id number] DOB: 10/26/1929 DOA: 08/21/2022     2 DOS: the patient was seen and examined on 08/23/2022 at 10:06 AM      Brief hospital course: Marilyn Hobbs is a 88 y.o. F with PV, HTN, hypothyroidism, COPD not on O2 and hyperlipidemia who presented with weakness and falls.  In the ER, noted to have cellulitis of the right great toe.  MRI negative for osteomyelitis.   10/4: Admitted on antibiotics 10/5: Tested positive for COVID 10/6: Antibiotics stopped     Assessment and Plan: * COVID Patient presented with malaise, weakness/fatigue, sore throat and cough.  There was some initial concern that her fever and weakness were related to cellulitis, but she reports that her symptoms started before she stubbed her toe and it became red.  In the hospital, COVID testing positive -Continue Paxlovid, day 2 of 5 - Stop antibiotics - PT eval     COPD with acute exacerbation ([place]) Patient with wheezing increased SOB today, despite ICS/LABA/LAMA. - Start prednisone burst - Start scheduled bronchodilators  Hypothyroidism TSH elevated slightly, suspect this is related to her COVID - Continue levothyroxine - Repeat TSH in 1 week after recovery, titrate LT4 dose if still elevated   Ovarian cyst Incidental finding of right ovarian cyst on CT abdomen pelvis.  Daughter is made aware that she can pursue pelvic ultrasound outpatient depending on patient's goals of care.  Transaminitis Improving today.  Due to COVID  Myocardial injury Troponin of 77-67. No chest pain or ECG changes, myocardial injury due to concurrent illness, no further work up required.  Fall at home, initial encounter Due to COVID  Stage 3a chronic kidney disease (CKD) ([place]) AKI ruled out.  Creatinine mildly elevated at 1.19. CT A/P is showing mild left hydronephrosis and hydroureter of uncertain etiology . Will obtain renal ultrasound to further evaluation.  Confusion She has  some disorganized thinking, probably very mild delirium given covid.  No targeted therapy would be helpful.  Polycythemia vera (HCC) Hgb stable at 14.   Essential hypertension BP normal - Continue amlodipine          Subjective:*Still has sore throat, cough.  No wheezing today, feels somewhat out of breath.  Oxygen levels fine.  No fever, vomiting, chest pain.  A little confused.     Physical Exam: BP (!) 116/50 (BP Location: Left Arm)   Pulse 77   Temp 97.9 F (36.6 C) (Oral)   Resp 20   Ht '5\' 4"'$  (1.626 m)   Wt 60.8 kg   SpO2 96%   BMI 23.00 kg/m   Elderly adult female, lying in bed, no acute distress, responds to questions, interactive RRR, no murmurs, no peripheral edema Respiratory rate somewhat increased, wheezing bilaterally, no rales Attention normal, affect normal, judgment insight appear slightly impaired, she is a little bit distracted and tangential, oriented to self, hospital, situation.  Face symmetric, speech fluent, moves all extremities with normal strength and coordination    Data Reviewed: COVID testing and respiratory virus panel reviewed, COVID-positive LFTs improving Creatinine 1.2, no change TSH 11 Complete blood count unremarkable, platelets 121    Family Communication: Called the daughter, no answer    Disposition: Status is: Inpatient Patient was admitted for acute metabolic encephalopathy and weakness, found to have COVID.  Will need rehab.  Medically ready for discharge when bed available.        Author: Edwin Dada, MD 08/23/2022 3:03 PM  For on call  review [email or web address].

## 2022-08-23 NOTE — Plan of Care (Signed)
  Problem: Education: Goal: Knowledge of General Education information will improve Description Including pain rating scale, medication(s)/side effects and non-pharmacologic comfort measures Outcome: Progressing   

## 2022-08-23 NOTE — Assessment & Plan Note (Signed)
Patient with wheezing increased SOB today, despite ICS/LABA/LAMA. - Start prednisone burst - Start scheduled bronchodilators

## 2022-08-23 NOTE — Evaluation (Signed)
Physical Therapy Evaluation Patient Details Name: Marilyn Hobbs MRN: [id number] DOB: Jun 15, 1929 Today's Date: 08/23/2022  History of Present Illness  88 yo female with weakness and falls, noted to have cellulitis of the right great toe, also Covid +  MRI negative for osteomyelitis.    PMH: HTN, hypothyroidism, COPD, hyperlipidemia, polycthemia vera  Clinical Impression  Pt admitted with above diagnosis.  Pt reports ind at her baseline. Today requiring max assist to stand, fatigues rapidly during PT session. Reports dizziness however BP 136/77 in sitting. Recommend SNF. Continue to follow   Pt currently with functional limitations due to the deficits listed below (see PT Problem List). Pt will benefit from skilled PT to increase their independence and safety with mobility to allow discharge to the venue listed below.          Recommendations for follow up therapy are one component of a multi-disciplinary discharge planning process, led by the attending physician.  Recommendations may be updated based on patient status, additional functional criteria and insurance authorization.  Follow Up Recommendations Skilled nursing-short term rehab (<3 hours/day) Can patient physically be transported by private vehicle: No    Assistance Recommended at Discharge Frequent or constant Supervision/Assistance  Patient can return home with the following  Two people to help with walking and/or transfers;Two people to help with bathing/dressing/bathroom;Assist for transportation;Help with stairs or ramp for entrance;Assistance with cooking/housework    Equipment Recommendations None recommended by PT  Recommendations for Other Services       Functional Status Assessment Patient has had a recent decline in their functional status and demonstrates the ability to make significant improvements in function in a reasonable and predictable amount of time.     Precautions / Restrictions Precautions Precautions:  Fall Restrictions Weight Bearing Restrictions: No      Mobility  Bed Mobility Overal bed mobility: Needs Assistance Bed Mobility: Supine to Sit, Sit to Supine     Supine to sit: Max assist Sit to supine: Max assist   General bed mobility comments: assist with trunk and LEs to upright and then to return to supine    Transfers Overall transfer level: Needs assistance Equipment used: Rolling walker (2 wheels) Transfers: Sit to/from Stand Sit to Stand: Max assist           General transfer comment: assist with anterior-superior wt shift; heavy posterior bias in standing, trunk flexed. unable to maintain standing >30 seconds    Ambulation/Gait               General Gait Details: unable  Stairs            Wheelchair Mobility    Modified Rankin (Stroke Patients Only)       Balance Overall balance assessment: Needs assistance, History of Falls Sitting-balance support: Feet supported, Single extremity supported, No upper extremity supported Sitting balance-Leahy Scale: Fair Sitting balance - Comments: with incr time able to maintain midline with supervision Postural control: Posterior lean Standing balance support: During functional activity, Reliant on assistive device for balance, Bilateral upper extremity supported Standing balance-Leahy Scale: Zero                               Pertinent Vitals/Pain Pain Assessment Pain Assessment: 0-10 Pain Score: 4  Pain Location: right foot Pain Descriptors / Indicators: Sore Pain Intervention(s): Limited activity within patient's tolerance, Monitored during session, Premedicated before session, Repositioned    Home Living Family/patient expects to be  discharged to:: Unsure Living Arrangements: Children Available Help at Discharge: Family;Available PRN/intermittently Type of Home: House Home Access: Stairs to enter Entrance Stairs-Rails: None Entrance Stairs-Number of Steps: 2   Home Layout:  One level Home Equipment: Rollator (4 wheels);Shower seat;Toilet riser;Cane - single point Additional Comments: uses rollator or cane, bathes herself on shower seat; lives with daughter and grandson who are home "most of the time" per pt (daughter works from home, some days goes into office)    Prior Function Prior Level of Function : Independent/Modified Independent             Mobility Comments: does not drive, uses rollator or cane in the house per pt       Hand Dominance        Extremity/Trunk Assessment   Upper Extremity Assessment Upper Extremity Assessment: Generalized weakness    Lower Extremity Assessment Lower Extremity Assessment: Generalized weakness       Communication   Communication: No difficulties  Cognition Arousal/Alertness: Awake/alert Behavior During Therapy: WFL for tasks assessed/performed Overall Cognitive Status: Impaired/Different from baseline Area of Impairment: Problem solving, Memory, Following commands, Safety/judgement                     Memory: Decreased short-term memory Following Commands: Follows one step commands with increased time Safety/Judgement: Decreased awareness of deficits   Problem Solving: Slow processing, Decreased initiation, Difficulty sequencing, Requires verbal cues, Requires tactile cues          General Comments      Exercises     Assessment/Plan    PT Assessment Patient needs continued PT services  PT Problem List Decreased strength;Decreased mobility;Decreased activity tolerance;Decreased balance;Decreased cognition;Decreased safety awareness       PT Treatment Interventions DME instruction;Therapeutic exercise;Gait training;Functional mobility training;Therapeutic activities;Patient/family education    PT Goals (Current goals can be found in the Care Plan section)  Acute Rehab PT Goals PT Goal Formulation: Patient unable to participate in goal setting Time For Goal Achievement:  09/06/22 Potential to Achieve Goals: Good    Frequency Min 2X/week     Co-evaluation               AM-PAC PT "6 Clicks" Mobility  Outcome Measure Help needed turning from your back to your side while in a flat bed without using bedrails?: A Lot Help needed moving from lying on your back to sitting on the side of a flat bed without using bedrails?: A Lot Help needed moving to and from a bed to a chair (including a wheelchair)?: A Lot Help needed standing up from a chair using your arms (e.g., wheelchair or bedside chair)?: A Lot Help needed to walk in hospital room?: Total Help needed climbing 3-5 steps with a railing? : Total 6 Click Score: 10    End of Session Equipment Utilized During Treatment: Gait belt Activity Tolerance: Patient tolerated treatment well Patient left: with call bell/phone within reach;in bed;with bed alarm set Nurse Communication: Mobility status PT Visit Diagnosis: Difficulty in walking, not elsewhere classified (R26.2);Other abnormalities of gait and mobility (R26.89);Muscle weakness (generalized) (M62.81)    Time: 4665-9935 PT Time Calculation (min) (ACUTE ONLY): 26 min   Charges:   PT Evaluation $PT Eval Low Complexity: 1 Low PT Treatments $Therapeutic Activity: 8-22 mins        Baxter Flattery, PT  Acute Rehab Dept [facility]) [phone]  [facility] Weekend Pager [facility] only)  [phone]  08/23/2022   [facility] 08/23/2022, 2:04 PM

## 2022-08-24 DIAGNOSIS — U071 COVID-19: Secondary | ICD-10-CM | POA: Diagnosis not present

## 2022-08-24 DIAGNOSIS — R410 Disorientation, unspecified: Secondary | ICD-10-CM | POA: Diagnosis not present

## 2022-08-24 DIAGNOSIS — J441 Chronic obstructive pulmonary disease with (acute) exacerbation: Secondary | ICD-10-CM | POA: Diagnosis not present

## 2022-08-24 DIAGNOSIS — W19XXXA Unspecified fall, initial encounter: Secondary | ICD-10-CM | POA: Diagnosis not present

## 2022-08-24 MED ORDER — IPRATROPIUM-ALBUTEROL 0.5-2.5 (3) MG/3ML IN SOLN
3.0000 mL | Freq: Two times a day (BID) | RESPIRATORY_TRACT | Status: DC
  Administered 2022-08-25 – 2022-09-02 (×17): 3 mL via RESPIRATORY_TRACT
  Filled 2022-08-24 (×16): qty 3

## 2022-08-24 MED ORDER — OXYCODONE HCL 5 MG PO TABS
5.0000 mg | ORAL_TABLET | Freq: Four times a day (QID) | ORAL | Status: DC | PRN
  Administered 2022-08-24 – 2022-09-02 (×17): 5 mg via ORAL
  Filled 2022-08-24 (×18): qty 1

## 2022-08-24 MED ORDER — ACETAMINOPHEN 500 MG PO TABS
1000.0000 mg | ORAL_TABLET | Freq: Once | ORAL | Status: AC
  Administered 2022-08-24: 1000 mg via ORAL
  Filled 2022-08-24: qty 2

## 2022-08-24 MED ORDER — IBUPROFEN 400 MG PO TABS
400.0000 mg | ORAL_TABLET | Freq: Four times a day (QID) | ORAL | Status: DC | PRN
  Administered 2022-08-24 – 2022-09-02 (×7): 400 mg via ORAL
  Filled 2022-08-24 (×7): qty 1

## 2022-08-24 NOTE — Progress Notes (Signed)
  Progress Note   Patient: Marilyn Hobbs SHF:[id number] DOB: 15-Jul-1929 DOA: 08/21/2022     3 DOS: the patient was seen and examined on 08/24/2022 at at 9:29AM      Brief hospital course: [name] is a 88 y.o. F with PV, HTN, hypothyroidism, COPD not on O2 and hyperlipidemia who presented with weakness and falls.  In the ER, noted to have cellulitis of the right great toe.  MRI negative for osteomyelitis.   10/4: Admitted on antibiotics 10/5: Tested positive for COVID 10/6: Antibiotics stopped     Assessment and Plan: * COVID -Continue Paxlovid, day 3 of 5 - PT eval     COPD with acute exacerbation (HCC) Wheezing better - Continue prednisone - Continue scheduled bronchodilators  Hypothyroidism - Continue levothyroxine    Essential hypertension BP normal - Continue amlodipine          Subjective: Wheezing improved, no dyspnea, no fever, no vomiting, no chest discomfort.  Still some mild confusion    Physical Exam: BP 131/63   Pulse 78   Temp 97.8 F (36.6 C)   Resp 15   Ht '5\' 4"'$  (1.626 m)   Wt 60.8 kg   SpO2 94%   BMI 23.00 kg/m   Elderly adult female, lying in bed, appears weak and tired, trying to write on her menu RRR, no murmurs, no peripheral edema Respiratory rate normal, did not appreciate wheezes today Abdomen soft without tenderness palpation or guarding, no voluntary guarding, no rigidity, no rebound, no distention Attention normal, affect blunted, judgment and insight appear mildly impaired, she is oriented to hospital, soft, situation, but seems easily distractible and forgetful      Data Reviewed: No new labs  Family Communication:      Disposition: Status is: Inpatient Patient was admitted for acute metabolic encephalopathy and weakness, found to have COVID.  Will need rehab.  Medically ready for discharge when bed available.        Author: Edwin Dada, MD 08/24/2022 2:49 PM  For on call review  [email or web address].

## 2022-08-24 NOTE — Plan of Care (Signed)
  Problem: Education: Goal: Knowledge of General Education information will improve Description Including pain rating scale, medication(s)/side effects and non-pharmacologic comfort measures Outcome: Progressing   

## 2022-08-25 DIAGNOSIS — W19XXXA Unspecified fall, initial encounter: Secondary | ICD-10-CM | POA: Diagnosis not present

## 2022-08-25 DIAGNOSIS — M6282 Rhabdomyolysis: Secondary | ICD-10-CM | POA: Insufficient documentation

## 2022-08-25 DIAGNOSIS — J441 Chronic obstructive pulmonary disease with (acute) exacerbation: Secondary | ICD-10-CM | POA: Diagnosis not present

## 2022-08-25 DIAGNOSIS — R410 Disorientation, unspecified: Secondary | ICD-10-CM | POA: Diagnosis not present

## 2022-08-25 DIAGNOSIS — U071 COVID-19: Secondary | ICD-10-CM | POA: Diagnosis not present

## 2022-08-25 LAB — BASIC METABOLIC PANEL
Anion gap: 7 (ref 5–15)
BUN: 19 mg/dL (ref 8–23)
CO2: 25 mmol/L (ref 22–32)
Calcium: 8.4 mg/dL — ABNORMAL LOW (ref 8.9–10.3)
Chloride: 99 mmol/L (ref 98–111)
Creatinine, Ser: 0.88 mg/dL (ref 0.44–1.00)
GFR, Estimated: >60 mL/min (ref >60)
Glucose, Bld: 97 mg/dL (ref 70–99)
Potassium: 4.6 mmol/L (ref 3.5–5.1)
Sodium: 131 mmol/L — ABNORMAL LOW (ref 135–145)

## 2022-08-25 LAB — CBC
HCT: 34.8 % — ABNORMAL LOW (ref 36.0–46.0)
Hemoglobin: 11.5 g/dL — ABNORMAL LOW (ref 12.0–15.0)
MCH: 31.3 pg (ref 26.0–34.0)
MCHC: 33 g/dL (ref 30.0–36.0)
MCV: 94.6 fL (ref 80.0–100.0)
Platelets: 141 10*3/uL — ABNORMAL LOW (ref 150–400)
RBC: 3.68 MIL/uL — ABNORMAL LOW (ref 3.87–5.11)
RDW: 13.2 % (ref 11.5–15.5)
WBC: 5.4 10*3/uL (ref 4.0–10.5)
nRBC: 0 % (ref 0.0–0.2)

## 2022-08-25 LAB — CK: Total CK: 574 U/L — ABNORMAL HIGH (ref 38–234)

## 2022-08-25 MED ORDER — SENNOSIDES-DOCUSATE SODIUM 8.6-50 MG PO TABS
1.0000 | ORAL_TABLET | Freq: Two times a day (BID) | ORAL | Status: DC
  Administered 2022-08-25 – 2022-09-02 (×11): 1 via ORAL
  Filled 2022-08-25 (×14): qty 1

## 2022-08-25 MED ORDER — POLYETHYLENE GLYCOL 3350 17 G PO PACK
17.0000 g | PACK | Freq: Every day | ORAL | Status: DC
  Administered 2022-08-25 – 2022-08-31 (×4): 17 g via ORAL
  Filled 2022-08-25 (×6): qty 1

## 2022-08-25 MED ORDER — BISACODYL 10 MG RE SUPP
10.0000 mg | Freq: Every day | RECTAL | Status: DC | PRN
  Administered 2022-08-25: 10 mg via RECTAL
  Filled 2022-08-25: qty 1

## 2022-08-25 NOTE — Plan of Care (Signed)
  Problem: Education: Goal: Knowledge of General Education information will improve Description: Including pain rating scale, medication(s)/side effects and non-pharmacologic comfort measures Outcome: Progressing   Problem: Activity: Goal: Risk for activity intolerance will decrease Outcome: Progressing   Problem: Coping: Goal: Level of anxiety will decrease Outcome: Progressing   

## 2022-08-25 NOTE — TOC Initial Note (Signed)
Transition of Care [facility]) - Initial/Assessment Note    Patient Details  Name: Marilyn Hobbs MRN: [id number] Date of Birth: 06-15-1929  Transition of Care [facility]) CM/SW Contact:    Henrietta Dine, RN Phone Number: 08/25/2022, 2:39 PM  Clinical Narrative:                 [facility] consult for SNF placement; spoke with pt over phone and gave her recc; the pt says she does not want to go to SNF because her dtr works from home; she would like for me to discuss this with her dtr; contacted dtr Marilyn Hobbs [phone]) and gave her recc and pt's response; she would like to discuss this with the pt; will notify MD; TOC will con't to follow.  Expected Discharge Plan: Skilled Nursing Facility Barriers to Discharge: Continued Medical Work up   Patient Goals and CMS Choice Patient states their goals for this hospitalization and ongoing recovery are:: home with daughter      Expected Discharge Plan and Services Expected Discharge Plan: [place]   Discharge Planning Services: CM Consult   Living arrangements for the past 2 months: Single Family Home                                      Prior Living Arrangements/Services Living arrangements for the past 2 months: Single Family Home Lives with:: Adult Children Patient language and need for interpreter reviewed:: Yes Do you feel safe going back to the place where you live?: Yes      Need for Family Participation in Patient Care: Yes (Comment) Care giver support system in place?: Yes (comment)   Criminal Activity/Legal Involvement Pertinent to Current Situation/Hospitalization: No - Comment as needed  Activities of Daily Living Home Assistive Devices/Equipment: Walker (specify type), Eyeglasses ADL Screening (condition at time of admission) Patient's cognitive ability adequate to safely complete daily activities?: Yes Is the patient deaf or have difficulty hearing?: No Does the patient have difficulty seeing, even  when wearing glasses/contacts?: No Does the patient have difficulty concentrating, remembering, or making decisions?: No Patient able to express need for assistance with ADLs?: Yes Does the patient have difficulty dressing or bathing?: No Independently performs ADLs?: No Communication: Independent Dressing (OT): Independent Grooming: Independent Feeding: Independent Bathing: Independent Toileting: Independent Is this a change from baseline?: Pre-admission baseline In/Out Bed: Independent Is this a change from baseline?: Pre-admission baseline Walks in Home: Independent Is this a change from baseline?: Pre-admission baseline Does the patient have difficulty walking or climbing stairs?: No Weakness of Legs: None Weakness of Arms/Hands: None  Permission Sought/Granted Permission sought to share information with : Case Manager, Customer service manager    Share Information with NAME: Lenor Coffin, RN, CM     Permission granted to share info w Relationship: daughter  Permission granted to share info w Contact Information: Kristol Almanzar [phone]  Emotional Assessment   Attitude/Demeanor/Rapport: Gracious Affect (typically observed): Accepting Orientation: : Oriented to Self, Oriented to Place, Oriented to  Time Alcohol / Substance Use: Not Applicable Psych Involvement: No (comment)  Admission diagnosis:  Osteomyelitis ([place]) [M86.9] Fall, initial encounter [W19.XXXA] Pain in both lower extremities [M79.604, M79.605] Osteomyelitis of right foot, unspecified type [facility]) [M86.9] Patient Active Problem List   Diagnosis Date Noted   COPD with acute exacerbation ([place]) 08/23/2022   Hypothyroidism 08/23/2022   Fall at home, initial encounter 08/22/2022   Myocardial injury  08/22/2022   Transaminitis 08/22/2022   Ovarian cyst 08/22/2022   COVID 08/22/2022   Stage 3a chronic kidney disease (CKD) ([place]) 08/21/2022   Rash 01/03/2022   PHN (postherpetic neuralgia) 01/03/2022    Hyponatremia 10/08/2021   Acute kidney injury superimposed on CKD ([place]) 10/08/2021   Dehydration 10/08/2021   Confusion 10/08/2021   COPD (chronic obstructive pulmonary disease) ([place]) 10/08/2021   Mixed simple and mucopurulent chronic bronchitis ([place]) 08/24/2021   Postoperative hypothyroidism 07/12/2021   Polycythemia vera ([place]) 07/12/2021   Essential hypertension 07/04/2021   CKD (chronic kidney disease), stage III ([place]) 07/04/2021   PCP:  Janith Lima, MD Pharmacy:   [facility] [address] [place], [place]. AT [address] [place] [address] Phone: [phone] Fax: [phone]  [place], [place] [place]. [address] [place] [address] Phone: [phone] Fax: [phone]     Social Determinants of Health (SDOH) Interventions    Readmission Risk Interventions     No data to display

## 2022-08-25 NOTE — Plan of Care (Signed)
Plan of care reviewed and discussed. °

## 2022-08-25 NOTE — Progress Notes (Signed)
  Progress Note   Patient: Marilyn Hobbs LKG:[id number] DOB: 01/24/1929 DOA: 08/21/2022     4 DOS: the patient was seen and examined on 08/25/2022 at at 11:40AM      Brief hospital course: Marilyn Hobbs is a 88 y.o. F with PV, HTN, hypothyroidism, COPD not on O2 and hyperlipidemia who presented with weakness and falls.  In the ER, noted to have cellulitis of the right great toe.  MRI negative for osteomyelitis.   10/4: Admitted on antibiotics 10/5: Tested positive for COVID 10/6: Antibiotics stopped     Assessment and Plan: * COVID -Continue Paxlovid, day 4 of 5 - PT eval     COPD with acute exacerbation ([place]) Wheezing resolved - Continue prednisone - Continue scheduled bronchodilators  Hypothyroidism - Continue levothyroxine    Essential hypertension BP normal - Continue amlodipine          Subjective: No complaints, no nursing concerns, severe to weakness    Physical Exam: BP (!) 160/87 (BP Location: Left Arm)   Pulse 75   Temp 97.6 F (36.4 C) (Oral)   Resp 18   Ht '5\' 4"'$  (1.626 m)   Wt 60.8 kg   SpO2 99%   BMI 23.00 kg/m   Elderly adult female, lying in bed, appears debilitated somewhat confused RRR, no murmurs, no peripheral edema Respiratory rate normal, lungs clear without rales or wheezes Mild cognitive impairment      Data Reviewed: CK5 74 Basic metabolic panel shows sodium down slightly, creatinine normal Hemogram shows mild anemia       Disposition: Status is: Inpatient Patient was admitted for acute metabolic encephalopathy and weakness, found to have COVID.  Will need rehab.  Medically ready for discharge when bed available.        Author: Edwin Dada, MD 08/25/2022 5:05 PM  For on call review [email or web address].

## 2022-08-26 DIAGNOSIS — U071 COVID-19: Secondary | ICD-10-CM | POA: Diagnosis not present

## 2022-08-26 LAB — COMPREHENSIVE METABOLIC PANEL
ALT: 52 U/L — ABNORMAL HIGH (ref 0–44)
AST: 73 U/L — ABNORMAL HIGH (ref 15–41)
Albumin: 2.9 g/dL — ABNORMAL LOW (ref 3.5–5.0)
Alkaline Phosphatase: 58 U/L (ref 38–126)
Anion gap: 7 (ref 5–15)
BUN: 22 mg/dL (ref 8–23)
CO2: 26 mmol/L (ref 22–32)
Calcium: 8.6 mg/dL — ABNORMAL LOW (ref 8.9–10.3)
Chloride: 101 mmol/L (ref 98–111)
Creatinine, Ser: 0.84 mg/dL (ref 0.44–1.00)
GFR, Estimated: >60 mL/min (ref >60)
Glucose, Bld: 106 mg/dL — ABNORMAL HIGH (ref 70–99)
Potassium: 4.5 mmol/L (ref 3.5–5.1)
Sodium: 134 mmol/L — ABNORMAL LOW (ref 135–145)
Total Bilirubin: 1 mg/dL (ref 0.3–1.2)
Total Protein: 6.2 g/dL — ABNORMAL LOW (ref 6.5–8.1)

## 2022-08-26 LAB — CULTURE, BLOOD (ROUTINE X 2)
Culture: NO GROWTH
Special Requests: ADEQUATE

## 2022-08-26 LAB — CBC
HCT: 37.9 % (ref 36.0–46.0)
Hemoglobin: 12.6 g/dL (ref 12.0–15.0)
MCH: 30.6 pg (ref 26.0–34.0)
MCHC: 33.2 g/dL (ref 30.0–36.0)
MCV: 92 fL (ref 80.0–100.0)
Platelets: 189 10*3/uL (ref 150–400)
RBC: 4.12 MIL/uL (ref 3.87–5.11)
RDW: 13.1 % (ref 11.5–15.5)
WBC: 6.3 10*3/uL (ref 4.0–10.5)
nRBC: 0 % (ref 0.0–0.2)

## 2022-08-26 LAB — CK: Total CK: 453 U/L — ABNORMAL HIGH (ref 38–234)

## 2022-08-26 MED ORDER — AMLODIPINE BESYLATE 5 MG PO TABS
5.0000 mg | ORAL_TABLET | Freq: Every day | ORAL | Status: DC
  Administered 2022-08-27 – 2022-09-02 (×7): 5 mg via ORAL
  Filled 2022-08-26 (×7): qty 1

## 2022-08-26 MED ORDER — GUAIFENESIN-DM 100-10 MG/5ML PO SYRP
5.0000 mL | ORAL_SOLUTION | ORAL | Status: DC | PRN
  Administered 2022-08-26 – 2022-08-28 (×4): 5 mL via ORAL
  Filled 2022-08-26 (×4): qty 10

## 2022-08-26 NOTE — Progress Notes (Signed)
Physical Therapy Treatment Patient Details Name: Marilyn Hobbs MRN: [id number] DOB: 1929/04/25 Today's Date: 08/26/2022   History of Present Illness 88 yo female with weakness and falls, noted to have cellulitis of the right great toe, also Covid +  MRI negative for osteomyelitis.    PMH: HTN, hypothyroidism, COPD, hyperlipidemia, polycthemia vera    PT Comments    Pt tolerated OOB in recliner from 12:00 pm till 4:30 pm Assisted back to bed with NT required + 2 assist.  General transfer comment: assisted from recliner back to bed 1/4 pivot via "Bear Hug" due to pt's fear of falling and inability to stand upright or weight shift to advance any steps. HIGH anxiety.  Impaired cognition.  Positioned in bed to comfort.  Bed alarm active. Pt will need ST Rehab at SNF to address mobility and functional decline prior to safely returning home.   Recommendations for follow up therapy are one component of a multi-disciplinary discharge planning process, led by the attending physician.  Recommendations may be updated based on patient status, additional functional criteria and insurance authorization.  Follow Up Recommendations  Skilled nursing-short term rehab (<3 hours/day) Can patient physically be transported by private vehicle: No   Assistance Recommended at Discharge Frequent or constant Supervision/Assistance  Patient can return home with the following Two people to help with walking and/or transfers;Two people to help with bathing/dressing/bathroom;Assist for transportation;Help with stairs or ramp for entrance;Assistance with cooking/housework   Equipment Recommendations  None recommended by PT    Recommendations for Other Services       Precautions / Restrictions Precautions Precautions: Fall Restrictions Weight Bearing Restrictions: No     Mobility  Bed Mobility Overal bed mobility: Needs Assistance Bed Mobility: Sit to Supine     Supine to sit: Max assist, +2 for physical  assistance, +2 for safety/equipment Sit to supine: Total assist, +2 for physical assistance, +2 for safety/equipment   General bed mobility comments: pt required Total Asisst + 2 back to bed and position to comfort.    Transfers Overall transfer level: Needs assistance Equipment used: None Transfers: Bed to chair/wheelchair/BSC Sit to Stand: Max assist, Total assist, +2 physical assistance, +2 safety/equipment, From elevated surface Stand pivot transfers: Total assist, +2 physical assistance, +2 safety/equipment, From elevated surface         General transfer comment: assisted from recliner back to bed 1/4 pivot via "Bear Hug" due to pt's fear of falling and inability to stand upright or weight shift to advance any steps.    Ambulation/Gait               General Gait Details: unable due to HIGH anxiety/fear of falling   Stairs             Wheelchair Mobility    Modified Rankin (Stroke Patients Only)       Balance                                            Cognition Arousal/Alertness: Awake/alert Behavior During Therapy: WFL for tasks assessed/performed Overall Cognitive Status: No family/caregiver present to determine baseline cognitive functioning Area of Impairment: Problem solving, Memory, Following commands, Safety/judgement                     Memory: Decreased short-term memory Following Commands: Follows one step commands with increased time Safety/Judgement: Decreased awareness of  deficits   Problem Solving: Slow processing, Decreased initiation, Difficulty sequencing, Requires verbal cues, Requires tactile cues General Comments: AxO x 2 pleasant Lady following repeat functional commands.  Poor ST memory. High anxiety/great fear of falling. Pt asking, "I am spending the night?"        Exercises      General Comments        Pertinent Vitals/Pain Pain Assessment Pain Assessment: No/denies pain    Home Living                           Prior Function            PT Goals (current goals can now be found in the care plan section) Progress towards PT goals: Progressing toward goals    Frequency    Min 2X/week      PT Plan Current plan remains appropriate    Co-evaluation              AM-PAC PT "6 Clicks" Mobility   Outcome Measure  Help needed turning from your back to your side while in a flat bed without using bedrails?: Total Help needed moving from lying on your back to sitting on the side of a flat bed without using bedrails?: Total Help needed moving to and from a bed to a chair (including a wheelchair)?: Total Help needed standing up from a chair using your arms (e.g., wheelchair or bedside chair)?: Total Help needed to walk in hospital room?: Total Help needed climbing 3-5 steps with a railing? : Total 6 Click Score: 6    End of Session Equipment Utilized During Treatment: Gait belt Activity Tolerance: Patient tolerated treatment well Patient left: in bed;with call bell/phone within reach;with bed alarm set Nurse Communication: Mobility status PT Visit Diagnosis: Difficulty in walking, not elsewhere classified (R26.2);Other abnormalities of gait and mobility (R26.89);Muscle weakness (generalized) (M62.81)     Time: 6144-3154 PT Time Calculation (min) (ACUTE ONLY): 12 min  Charges:  $Therapeutic Activity: 8-22 mins                       Rica Koyanagi  PTA Acute  Rehabilitation Services Office M-F          [phone] Weekend pager [phone]

## 2022-08-26 NOTE — Progress Notes (Signed)
Physical Therapy Treatment Patient Details Name: Marilyn Hobbs MRN: [id number] DOB: 05/15/1929 Today's Date: 08/26/2022   History of Present Illness 88 yo female with weakness and falls, noted to have cellulitis of the right great toe, also Covid +  MRI negative for osteomyelitis.    PMH: HTN, hypothyroidism, COPD, hyperlipidemia, polycthemia vera    PT Comments    General Comments: AxO x 2 pleasant Lady following repeat functional commands.  Poor ST memory. High anxiety/great fear of falling. Assisted OOB to [facility] then recliner was difficult.  General bed mobility comments: required Max Asisst + 2 plus use of bed pad to complete transfer to EOB.  HIGH anxiety/fear of falling even when seated EOB with Therapist in front blocking. General transfer comment: attempted however pt unable to perfrom tradition sit to stand/push up from bed due to increased anxiety.  Pt also present with posterior leaning and pushing her self in opposite direction due to fear.  Pt was unable to use the walker as she was gripping for life.  Pt required + 2 side by side hand held assist to partially rise from as pt started to tremor and "ski" B LE's forward.  Applied smaller grippy socks and was able to get her onto [facility] 1/4 pivot.  Pt was unable to functionally weight shift or step.  Asissted off BSC via frontal "Bear Hug" as second assist performed peri care and switched the [facility] with the recliner from behind.  Pt positioned in recliner with multiple pillows for comfort. Called Daughter after session and gave her an update.  Pt was NOT able to walk.  Shared above transfer information.  Pt will need ST Rehab at SNF to address mobility and functional decline prior to safely returning home alone.   Recommendations for follow up therapy are one component of a multi-disciplinary discharge planning process, led by the attending physician.  Recommendations may be updated based on patient status, additional functional criteria and insurance  authorization.  Follow Up Recommendations  Skilled nursing-short term rehab (<3 hours/day) Can patient physically be transported by private vehicle: No   Assistance Recommended at Discharge Frequent or constant Supervision/Assistance  Patient can return home with the following Two people to help with walking and/or transfers;Two people to help with bathing/dressing/bathroom;Assist for transportation;Help with stairs or ramp for entrance;Assistance with cooking/housework   Equipment Recommendations  None recommended by PT    Recommendations for Other Services       Precautions / Restrictions Precautions Precautions: Fall Restrictions Weight Bearing Restrictions: No     Mobility  Bed Mobility Overal bed mobility: Needs Assistance Bed Mobility: Supine to Sit     Supine to sit: Max assist, +2 for physical assistance, +2 for safety/equipment     General bed mobility comments: required Max Asisst + 2 plus use of bed pad to complete transfer to EOB.  HIGH anxiety/fear of falling even when seated EOB with Therapist in front blocking.    Transfers Overall transfer level: Needs assistance Equipment used: Rolling walker (2 wheels), None Transfers: Bed to chair/wheelchair/BSC Sit to Stand: Max assist, Total assist, +2 physical assistance, +2 safety/equipment, From elevated surface Stand pivot transfers: Total assist, +2 physical assistance, +2 safety/equipment, From elevated surface         General transfer comment: attempted however pt unable to perfrom tradition sit to stand/push up from bed due to increased anxiety.  Pt also present with posterior leaning and pushing her self in opposite direction due to fear.  Pt was unable to  use the walker as she was gripping for life.  Pt required + 2 side by side hand held assist to partially rise from as pt started to tremor and "ski" B LE's forward.  Applied smaller grippy socks and was able to get her onto [facility] 1/4 pivot.  Pt was unable to  functionally weight shift or step.  Asissted off BSC via frontal "Bear Hug" as second assist performed peri care and switched the [facility] with the recliner from behind.    Ambulation/Gait               General Gait Details: unable due to HIGH anxiety/fear of falling   Stairs             Wheelchair Mobility    Modified Rankin (Stroke Patients Only)       Balance                                            Cognition Arousal/Alertness: Awake/alert Behavior During Therapy: WFL for tasks assessed/performed Overall Cognitive Status: No family/caregiver present to determine baseline cognitive functioning                       Memory: Decreased short-term memory Following Commands: Follows one step commands with increased time Safety/Judgement: Decreased awareness of deficits   Problem Solving: Slow processing, Decreased initiation, Difficulty sequencing, Requires verbal cues, Requires tactile cues General Comments: AxO x 2 pleasant Lady following repeat functional commands.  Poor ST memory. High anxiety/great fear of falling.        Exercises      General Comments        Pertinent Vitals/Pain Pain Assessment Pain Assessment: No/denies pain    Home Living                          Prior Function            PT Goals (current goals can now be found in the care plan section) Progress towards PT goals: Progressing toward goals    Frequency    Min 2X/week      PT Plan Current plan remains appropriate    Co-evaluation              AM-PAC PT "6 Clicks" Mobility   Outcome Measure  Help needed turning from your back to your side while in a flat bed without using bedrails?: Total Help needed moving from lying on your back to sitting on the side of a flat bed without using bedrails?: Total Help needed moving to and from a bed to a chair (including a wheelchair)?: Total Help needed standing up from a chair using  your arms (e.g., wheelchair or bedside chair)?: Total Help needed to walk in hospital room?: Total Help needed climbing 3-5 steps with a railing? : Total 6 Click Score: 6    End of Session Equipment Utilized During Treatment: Gait belt Activity Tolerance: Patient tolerated treatment well Patient left: in chair;with call bell/phone within reach;with chair alarm set Nurse Communication: Mobility status PT Visit Diagnosis: Difficulty in walking, not elsewhere classified (R26.2);Other abnormalities of gait and mobility (R26.89);Muscle weakness (generalized) (M62.81)     Time: 0277-4128 PT Time Calculation (min) (ACUTE ONLY): 27 min  Charges:  $Therapeutic Activity: 23-37 mins  Rica Koyanagi  PTA Acute  Rehabilitation Services Office M-F          [phone] Weekend pager [phone]

## 2022-08-26 NOTE — Plan of Care (Signed)
Plan of care reviewed and discussed. °

## 2022-08-26 NOTE — Progress Notes (Signed)
PROGRESS NOTE    Marilyn Hobbs  HAL:[id number] DOB: [birth year] DOA: 08/21/2022  PCP: Janith Lima, MD   Brief Narrative:  This 88 years old female with PMH significant for hypertension, COPD not on home oxygen, hyperlipidemia presented in the ED with generalized weakness and falls.  In the ER patient was noted to have cellulitis of right great toe.  MRI negative for osteomyelitis.  She was started on empiric antibiotics.  Later she was tested positive for COVID started on paxloid and antibiotics were discontinued.  PT recommended SNF.  Assessment & Plan:   Principal Problem:   COVID Active Problems:   COPD with acute exacerbation ([place])   Essential hypertension   Polycythemia vera ([place])   Confusion   Stage 3a chronic kidney disease (CKD) ([place])   Fall at home, initial encounter   Myocardial injury   Transaminitis   Ovarian cyst   Hypothyroidism   Rhabdomyolysis  Acute hypoxic respiratory failure COVID+ Patient presented with generalized weakness and falls. Tested positive for COVID  and started on Paxlovid. Continue Paxlovid for 5 days. PT and OT recommended SNF.  Awaiting. Continue supplemental oxygen and wean as tolerated.  COPD with acute exacerbation Continue prednisone. Continue scheduled bronchodilators.   Now wheezing has resolved.  Hypothyroidism: Continue levothyroxine 25 micrograms daily  Essential hypertension: Continue amlodipine 2.5 mg daily  DVT prophylaxis: Lovenox Code Status: Full code. Family Communication:No family at bed side. Disposition Plan:    Status is: Inpatient Remains inpatient appropriate because: Admitted for generalized weakness and falls found to have COVID positivity started on Paxlovid.  PT recommended SNF.  Patient either can go home with home health services or have to wait until 10/16 to be discharged to SNF.   Consultants:  None  Procedures:None Antimicrobials:  Anti-infectives (From admission, onward)    Start      Dose/Rate Route Frequency Ordered Stop   08/23/22 2100  vancomycin (VANCOREADY) IVPB 750 mg/150 mL  Status:  Discontinued        750 mg 150 mL/hr over 60 Minutes Intravenous Every 48 hours 08/21/22 2049 08/22/22 0038   08/23/22 1000  cefadroxil (DURICEF) capsule 500 mg  Status:  Discontinued        500 mg Oral 2 times daily 08/23/22 0737 08/23/22 1452   08/22/22 2200  nirmatrelvir/ritonavir EUA (PAXLOVID) 3 tablet        3 tablet Oral 2 times daily 08/22/22 1555 08/27/22 2159   08/22/22 1800  ceFAZolin (ANCEF) IVPB 2g/100 mL premix  Status:  Discontinued        2 g 200 mL/hr over 30 Minutes Intravenous Every 8 hours 08/22/22 1250 08/23/22 0737   08/22/22 1000  ceFAZolin (ANCEF) IVPB 2g/100 mL premix  Status:  Discontinued        2 g 200 mL/hr over 30 Minutes Intravenous Every 12 hours 08/22/22 0048 08/22/22 1250   08/21/22 2015  ceFEPIme (MAXIPIME) 2 g in sodium chloride 0.9 % 100 mL IVPB        2 g 200 mL/hr over 30 Minutes Intravenous  Once 08/21/22 2003 08/21/22 2113   08/21/22 2015  vancomycin (VANCOCIN) IVPB 1000 mg/200 mL premix        1,000 mg 200 mL/hr over 60 Minutes Intravenous  Once 08/21/22 2011 08/21/22 2222        Subjective: Patient was seen and examined at bedside.  Overnight events noted.  Patient reports feeling better. She remains on supplemental oxygen.  Denies any chest pain.  Objective: Vitals:  08/25/22 2122 08/25/22 2135 08/26/22 0607 08/26/22 0846  BP: (!) 166/87  (!) 153/82   Pulse: (!) 101  80 71  Resp: '19  18 18  '$ Temp: 97.7 F (36.5 C)  97.8 F (36.6 C)   TempSrc: Oral  Oral   SpO2: 97% 97% 99% 99%  Weight:      Height:        Intake/Output Summary (Last 24 hours) at 08/26/2022 1306 Last data filed at 08/26/2022 0952 Gross per 24 hour  Intake 420 ml  Output 2750 ml  Net -2330 ml   Filed Weights   08/21/22 1850  Weight: 60.8 kg    Examination:  General exam: Appears comfortable, not in any acute distress, very  deconditioned. Respiratory system: CTA bilaterally, no wheezing, no crackles, normal respiratory effort. Cardiovascular system: S1 & S2 heard, regular rate and rhythm, no murmur. Gastrointestinal system: Abdomen is soft, non tender, non distended, BS +. Central nervous system: Alert and oriented x 3. No focal neurological deficits. Extremities: No edema, no cyanosis, no clubbing Skin: No rashes, lesions or ulcers Psychiatry: Judgement and insight appear normal. Mood & affect appropriate.     Data Reviewed: I have personally reviewed following labs and imaging studies  CBC: Recent Labs  Lab 08/21/22 1947 08/23/22 0330 08/25/22 0343 08/26/22 0326  WBC 7.9 5.2 5.4 6.3  NEUTROABS 5.8  --   --   --   HGB 14.2 12.4 11.5* 12.6  HCT 43.0 38.4 34.8* 37.9  MCV 94.1 95.8 94.6 92.0  PLT 152 121* 141* 973   Basic Metabolic Panel: Recent Labs  Lab 08/21/22 1947 08/22/22 0540 08/23/22 0330 08/25/22 0343 08/26/22 0326  NA 134* 137 136 131* 134*  K 4.0 4.6 4.1 4.6 4.5  CL 102 104 102 99 101  CO2 '23 25 26 25 26  '$ GLUCOSE 98 81 92 97 106*  BUN 25* '22 21 19 22  '$ CREATININE 1.19* 0.99 1.20* 0.88 0.84  CALCIUM 9.0 8.9 8.4* 8.4* 8.6*  MG 2.1  --   --   --   --    GFR: Estimated Creatinine Clearance: 37.7 mL/min (by C-G formula based on SCr of 0.84 mg/dL). Liver Function Tests: Recent Labs  Lab 08/21/22 1947 08/22/22 0540 08/23/22 0330 08/26/22 0326  AST 95* 100* 82* 73*  ALT 53* 52* 45* 52*  ALKPHOS 51 43 41 58  BILITOT 1.4* 1.5* 1.0 1.0  PROT 6.9 6.0* 5.5* 6.2*  ALBUMIN 3.8 3.2* 2.8* 2.9*   No results for input(s): "LIPASE", "AMYLASE" in the last 168 hours. No results for input(s): "AMMONIA" in the last 168 hours. Coagulation Profile: Recent Labs  Lab 08/21/22 1947  INR 1.0   Cardiac Enzymes: Recent Labs  Lab 08/25/22 0343 08/26/22 0326  CKTOTAL 574* 453*   BNP (last 3 results) No results for input(s): "PROBNP" in the last 8760 hours. HbA1C: No results for  input(s): "HGBA1C" in the last 72 hours. CBG: No results for input(s): "GLUCAP" in the last 168 hours. Lipid Profile: No results for input(s): "CHOL", "HDL", "LDLCALC", "TRIG", "CHOLHDL", "LDLDIRECT" in the last 72 hours. Thyroid Function Tests: No results for input(s): "TSH", "T4TOTAL", "FREET4", "T3FREE", "THYROIDAB" in the last 72 hours. Anemia Panel: No results for input(s): "VITAMINB12", "FOLATE", "FERRITIN", "TIBC", "IRON", "RETICCTPCT" in the last 72 hours. Sepsis Labs: Recent Labs  Lab 08/21/22 2036  LATICACIDVEN 1.7    Recent Results (from the past 240 hour(s))  Blood culture (routine x 2)     Status: None (Preliminary result)  Collection Time: 08/21/22  7:45 PM   Specimen: BLOOD  Result Value Ref Range Status   Specimen Description   Final    BLOOD RIGHT ANTECUBITAL Performed at [facility] [address] [place], [place] [address]    Special Requests   Final    BOTTLES DRAWN AEROBIC AND ANAEROBIC Blood Culture adequate volume Performed at [facility] [address] [place], [place] [address]    Culture   Final    NO GROWTH 4 DAYS Performed at [facility], [place] [address], [place], [place] [address]    Report Status PENDING  Incomplete  Blood culture (routine x 2)     Status: None (Preliminary result)   Collection Time: 08/21/22 10:32 PM   Specimen: BLOOD  Result Value Ref Range Status   Specimen Description   Final    BLOOD LEFT ANTECUBITAL Performed at [facility] [address], [place], [place] [address]    Special Requests   Final    BOTTLES DRAWN AEROBIC AND ANAEROBIC Blood Culture adequate volume Performed at [facility] [address], [place], [place] [address]    Culture   Final    NO GROWTH 3 DAYS Performed at [facility], [place] [address], [place],  [address]    Report Status PENDING  Incomplete  Resp Panel by RT-PCR (Flu A&B, Covid)  Anterior Nasal Swab     Status: Abnormal   Collection Time: 08/22/22  2:17 PM   Specimen: Anterior Nasal Swab  Result Value Ref Range Status   SARS Coronavirus 2 by RT PCR POSITIVE (A) NEGATIVE Final    Comment: (NOTE) SARS-CoV-2 target nucleic acids are DETECTED.  The SARS-CoV-2 RNA is generally detectable in upper respiratory specimens during the acute phase of infection. Positive results are indicative of the presence of the identified virus, but do not rule out bacterial infection or co-infection with other pathogens not detected by the test. Clinical correlation with patient history and other diagnostic information is necessary to determine patient infection status. The expected result is Negative.  Fact Sheet for Patients: EntrepreneurPulse.com.au  Fact Sheet for Healthcare Providers: IncredibleEmployment.be  This test is not yet approved or cleared by the [place] FDA and  has been authorized for detection and/or diagnosis of SARS-CoV-2 by FDA under an Emergency Use Authorization (EUA).  This EUA will remain in effect (meaning this test can be used) for the duration of  the COVID-19 declaration under Section 564(b)(1) of the A ct, 21 U.S.C. section 360bbb-3(b)(1), unless the authorization is terminated or revoked sooner.     Influenza A by PCR NEGATIVE NEGATIVE Final   Influenza B by PCR NEGATIVE NEGATIVE Final    Comment: (NOTE) The Xpert Xpress SARS-CoV-2/FLU/RSV plus assay is intended as an aid in the diagnosis of influenza from Nasopharyngeal swab specimens and should not be used as a sole basis for treatment. Nasal washings and aspirates are unacceptable for Xpert Xpress SARS-CoV-2/FLU/RSV testing.  Fact Sheet for Patients: EntrepreneurPulse.com.au  Fact Sheet for Healthcare Providers: IncredibleEmployment.be  This test is not yet approved or cleared by the [place] FDA and has  been authorized for detection and/or diagnosis of SARS-CoV-2 by FDA under an Emergency Use Authorization (EUA). This EUA will remain in effect (meaning this test can be used) for the duration of the COVID-19 declaration under Section 564(b)(1) of the Act, 21 U.S.C. section 360bbb-3(b)(1), unless the authorization is terminated or revoked.  Performed at  [facility], [place] [address], [place], [place] [address]   Respiratory (~20 pathogens) panel by PCR     Status: None   Collection Time: 08/22/22  2:17 PM   Specimen: Anterior Nasal Swab; Respiratory  Result Value Ref Range Status   Adenovirus NOT DETECTED NOT DETECTED Final   Coronavirus 229E NOT DETECTED NOT DETECTED Final    Comment: (NOTE) The Coronavirus on the Respiratory Panel, DOES NOT test for the novel  Coronavirus (2019 nCoV)    Coronavirus HKU1 NOT DETECTED NOT DETECTED Final   Coronavirus NL63 NOT DETECTED NOT DETECTED Final   Coronavirus OC43 NOT DETECTED NOT DETECTED Final   Metapneumovirus NOT DETECTED NOT DETECTED Final   Rhinovirus / Enterovirus NOT DETECTED NOT DETECTED Final   Influenza A NOT DETECTED NOT DETECTED Final   Influenza B NOT DETECTED NOT DETECTED Final   Parainfluenza Virus 1 NOT DETECTED NOT DETECTED Final   Parainfluenza Virus 2 NOT DETECTED NOT DETECTED Final   Parainfluenza Virus 3 NOT DETECTED NOT DETECTED Final   Parainfluenza Virus 4 NOT DETECTED NOT DETECTED Final   Respiratory Syncytial Virus NOT DETECTED NOT DETECTED Final   Bordetella pertussis NOT DETECTED NOT DETECTED Final   Bordetella Parapertussis NOT DETECTED NOT DETECTED Final   Chlamydophila pneumoniae NOT DETECTED NOT DETECTED Final   Mycoplasma pneumoniae NOT DETECTED NOT DETECTED Final    Comment: Performed at [facility], [address] [place],  [address]    Radiology Studies: No results found.  Scheduled Meds:  amLODipine  2.5 mg Oral Daily   aspirin EC  81 mg Oral Daily   enoxaparin  (LOVENOX) injection  30 mg Subcutaneous Q24H   fluticasone furoate-vilanterol  1 puff Inhalation Daily   ipratropium-albuterol  3 mL Nebulization BID   levothyroxine  25 mcg Oral Q0600   nirmatrelvir/ritonavir EUA  3 tablet Oral BID   polyethylene glycol  17 g Oral Daily   predniSONE  40 mg Oral Q breakfast   senna-docusate  1 tablet Oral BID   Continuous Infusions:   LOS: 5 days    Time spent: 35 mins    Joniel Graumann, MD [facility]   If 7PM-7AM, please contact night-coverage

## 2022-08-26 NOTE — NC FL2 (Signed)
[place] LEVEL OF CARE SCREENING TOOL     IDENTIFICATION  Patient Name: Marilyn Hobbs Birthdate: 1928-12-13 Sex: female Admission Date (Current Location): 08/21/2022  [facility] and [place] Number:  Herbalist and Address:  [facility],  [place], [place]      Provider Number: [id number]  Attending Physician Name and Address:  Shawna Clamp, MD  Relative Name and Phone Number:  JOSETTE, SHIMABUKURO [facility]) Daughter [phone]    MICHELENA, CULMER [phone]    Current Level of Care: Hospital Recommended Level of Care: [place] Prior Approval Number:    Date Approved/Denied:   PASRR Number: [id number]  Discharge Plan: SNF    Current Diagnoses: Patient Active Problem List   Diagnosis Date Noted   Rhabdomyolysis 08/25/2022   COPD with acute exacerbation ([place]) 08/23/2022   Hypothyroidism 08/23/2022   Fall at home, initial encounter 08/22/2022   Myocardial injury 08/22/2022   Transaminitis 08/22/2022   Ovarian cyst 08/22/2022   COVID 08/22/2022   Stage 3a chronic kidney disease (CKD) ([place]) 08/21/2022   Rash 01/03/2022   PHN (postherpetic neuralgia) 01/03/2022   Hyponatremia 10/08/2021   Acute kidney injury superimposed on CKD ([place]) 10/08/2021   Dehydration 10/08/2021   Confusion 10/08/2021   COPD (chronic obstructive pulmonary disease) ([place]) 10/08/2021   Mixed simple and mucopurulent chronic bronchitis ([place]) 08/24/2021   Postoperative hypothyroidism 07/12/2021   Polycythemia vera ([place]) 07/12/2021   Essential hypertension 07/04/2021   CKD (chronic kidney disease), stage III ([place]) 07/04/2021    Orientation RESPIRATION BLADDER Height & Weight     Self, Time, Situation, Place  O2 (2L) Incontinent Weight: 134 lb (60.8 kg) Height:  '5\' 4"'$  (162.6 cm)  BEHAVIORAL SYMPTOMS/MOOD NEUROLOGICAL BOWEL NUTRITION STATUS      Continent Diet  AMBULATORY STATUS COMMUNICATION OF NEEDS Skin   Limited Assist Verbally  Normal                       Personal Care Assistance Level of Assistance  Bathing, Feeding, Dressing Bathing Assistance: Limited assistance Feeding assistance: Independent Dressing Assistance: Limited assistance     Functional Limitations Info  Sight, Speech, Hearing Sight Info: Adequate Hearing Info: Adequate Speech Info: Adequate    SPECIAL CARE FACTORS FREQUENCY  PT (By licensed PT), OT (By licensed OT)     PT Frequency: Minimum 5x a week OT Frequency: Minimim 5x a week            Contractures Contractures Info: Not present    Additional Factors Info  Code Status, Allergies Code Status Info: Full Code Allergies Info: No Known Allergies           Current Medications (08/26/2022):  This is the current hospital active medication list Current Facility-Administered Medications  Medication Dose Route Frequency Provider Last Rate Last Admin   [START ON 08/27/2022] amLODipine (NORVASC) tablet 5 mg  5 mg Oral Daily Shawna Clamp, MD       aspirin EC tablet 81 mg  81 mg Oral Daily Tu, Ching T, DO   81 mg at 08/26/22 0856   bisacodyl (DULCOLAX) suppository 10 mg  10 mg Rectal Daily PRN Edwin Dada, MD   10 mg at 08/25/22 1207   enoxaparin (LOVENOX) injection 30 mg  30 mg Subcutaneous Q24H Tu, Ching T, DO   30 mg at 08/26/22 0856   fluticasone furoate-vilanterol (BREO ELLIPTA) 200-25 MCG/ACT 1 puff  1 puff Inhalation Daily Tu, Ching T, DO  1 puff at 08/26/22 0846   guaiFENesin-dextromethorphan (ROBITUSSIN DM) 100-10 MG/5ML syrup 5 mL  5 mL Oral Q4H PRN Edwin Dada, MD   5 mL at 08/26/22 0224   ibuprofen (ADVIL) tablet 400 mg  400 mg Oral Q6H PRN Edwin Dada, MD   400 mg at 08/26/22 0856   ipratropium-albuterol (DUONEB) 0.5-2.5 (3) MG/3ML nebulizer solution 3 mL  3 mL Nebulization Q6H PRN Tu, Ching T, DO   3 mL at 08/25/22 0356   ipratropium-albuterol (DUONEB) 0.5-2.5 (3) MG/3ML nebulizer solution 3 mL  3 mL Nebulization BID Edwin Dada, MD   3 mL at 08/26/22 0846   levothyroxine (SYNTHROID) tablet 25 mcg  25 mcg Oral Q0600 Tu, Ching T, DO   25 mcg at 08/26/22 4492   nirmatrelvir/ritonavir EUA (PAXLOVID) 3 tablet  3 tablet Oral BID Edwin Dada, MD   3 tablet at 08/26/22 0857   oxyCODONE (Oxy IR/ROXICODONE) immediate release tablet 5 mg  5 mg Oral Q6H PRN Edwin Dada, MD   5 mg at 08/26/22 0613   phenol (CHLORASEPTIC) mouth spray 1 spray  1 spray Mouth/Throat PRN Edwin Dada, MD   1 spray at 08/23/22 0630   polyethylene glycol (MIRALAX / GLYCOLAX) packet 17 g  17 g Oral Daily Edwin Dada, MD   17 g at 08/25/22 1207   predniSONE (DELTASONE) tablet 40 mg  40 mg Oral Q breakfast Danford, Suann Larry, MD   40 mg at 08/26/22 1046   senna-docusate (Senokot-S) tablet 1 tablet  1 tablet Oral BID Edwin Dada, MD   1 tablet at 08/26/22 0100     Discharge Medications: Please see discharge summary for a list of discharge medications.  Relevant Imaging Results:  Relevant Lab Results:   Additional Information SSN [id number]  Ross Ludwig, LCSW

## 2022-08-26 NOTE — TOC Progression Note (Addendum)
Transition of Care [facility]) - Progression Note    Patient Details  Name: Linzy Laury MRN: [id number] Date of Birth: [birth year]  Transition of Care [facility]) CM/SW Contact  Ross Ludwig, [place] Phone Number: 08/26/2022, 11:49 AM  Clinical Narrative:     CSW spoke to patient's daughter [name] to follow up on SNF verse home health.  Per patient's daughter they would like to pursue SNF placement for rehab due to patient not being able to ambulate on her own and being a two person assist and transfer.  Patient initially denied SNF, however they are agreeable to SNF now.  Patient was Covid+ as of 08/22/2022, so patient would not be able to go to a SNF until 09/02/2022.  CSW explained this to daughter and she expressed understanding.  CSW was given permission to begin bed search in [facility].  Patient has been faxed out awaiting bed offers, patient will not be able to go to SNF until 09/02/2022 due to being Covid+ 08/22/2022.            Expected Discharge Plan: [place] Barriers to Discharge: Continued Medical Work up  Expected Discharge Plan and Services Expected Discharge Plan: [place]   Discharge Planning Services: CM Consult   Living arrangements for the past 2 months: Single Family Home                                       Social Determinants of Health (SDOH) Interventions    Readmission Risk Interventions     No data to display

## 2022-08-26 NOTE — Care Management Important Message (Signed)
Important Message  Patient Details IM Letter given Name: Marilyn Hobbs MRN: [id number] Date of Birth: January 03, 1929   Medicare Important Message Given:  Yes     Kerin Salen 08/26/2022, 2:35 PM

## 2022-08-27 DIAGNOSIS — U071 COVID-19: Secondary | ICD-10-CM | POA: Diagnosis not present

## 2022-08-27 LAB — HEPATIC FUNCTION PANEL
ALT: 45 U/L — ABNORMAL HIGH (ref 0–44)
AST: 44 U/L — ABNORMAL HIGH (ref 15–41)
Albumin: 2.8 g/dL — ABNORMAL LOW (ref 3.5–5.0)
Alkaline Phosphatase: 51 U/L (ref 38–126)
Bilirubin, Direct: 0.1 mg/dL (ref 0.0–0.2)
Indirect Bilirubin: 0.7 mg/dL (ref 0.3–0.9)
Total Bilirubin: 0.8 mg/dL (ref 0.3–1.2)
Total Protein: 5.8 g/dL — ABNORMAL LOW (ref 6.5–8.1)

## 2022-08-27 LAB — CULTURE, BLOOD (ROUTINE X 2)
Culture: NO GROWTH
Special Requests: ADEQUATE

## 2022-08-27 NOTE — Plan of Care (Signed)
  Problem: Education: Goal: Knowledge of General Education information will improve Description Including pain rating scale, medication(s)/side effects and non-pharmacologic comfort measures Outcome: Progressing   Problem: Activity: Goal: Risk for activity intolerance will decrease Outcome: Progressing   Problem: Safety: Goal: Ability to remain free from injury will improve Outcome: Progressing   

## 2022-08-27 NOTE — Progress Notes (Signed)
PROGRESS NOTE    Marilyn Hobbs  WNU:[id number] DOB: [birth year] DOA: 08/21/2022  PCP: Janith Lima, MD   Brief Narrative:  This 88 years old female with PMH significant for hypertension, COPD not on home oxygen, hyperlipidemia presented in the ED with generalized weakness and falls.  In the ER patient was noted to have cellulitis of right great toe.  MRI negative for osteomyelitis.  She was started on empiric antibiotics.  Later she was tested positive for COVID started on paxloid and antibiotics were discontinued.  PT recommended SNF.  Assessment & Plan:   Principal Problem:   COVID Active Problems:   COPD with acute exacerbation ([place])   Essential hypertension   Polycythemia vera ([place])   Confusion   Stage 3a chronic kidney disease (CKD) ([place])   Fall at home, initial encounter   Myocardial injury   Transaminitis   Ovarian cyst   Hypothyroidism   Rhabdomyolysis  Acute hypoxic respiratory failure: COVID+ Patient presented with generalized weakness and falls. Tested positive for COVID  and started on Paxlovid. Continue Paxlovid for 5 days. PT and OT recommended SNF.  Awaiting. Continue supplemental oxygen and wean as tolerated. She is weaned down to 1.5 L/ min  COPD with acute exacerbation Continue prednisone. Continue scheduled bronchodilators.   Now wheezing has resolved.  Hypothyroidism: Continue levothyroxine 25 micrograms daily  Essential hypertension: Continue amlodipine 2.5 mg daily  DVT prophylaxis: Lovenox Code Status: Full code. Family Communication:No family at bed side. Disposition Plan:    Status is: Inpatient Remains inpatient appropriate because: Admitted for generalized weakness and falls,  found to be COVID +, started on Paxlovid.  PT recommended SNF.  Patient either can go home with home health services or have to wait until 10/16 to be discharged to SNF.   Consultants:  None  Procedures:None.  Antimicrobials:  Anti-infectives (From  admission, onward)    Start     Dose/Rate Route Frequency Ordered Stop   08/23/22 2100  vancomycin (VANCOREADY) IVPB 750 mg/150 mL  Status:  Discontinued        750 mg 150 mL/hr over 60 Minutes Intravenous Every 48 hours 08/21/22 2049 08/22/22 0038   08/23/22 1000  cefadroxil (DURICEF) capsule 500 mg  Status:  Discontinued        500 mg Oral 2 times daily 08/23/22 0737 08/23/22 1452   08/22/22 2200  nirmatrelvir/ritonavir EUA (PAXLOVID) 3 tablet        3 tablet Oral 2 times daily 08/22/22 1555 08/27/22 0849   08/22/22 1800  ceFAZolin (ANCEF) IVPB 2g/100 mL premix  Status:  Discontinued        2 g 200 mL/hr over 30 Minutes Intravenous Every 8 hours 08/22/22 1250 08/23/22 0737   08/22/22 1000  ceFAZolin (ANCEF) IVPB 2g/100 mL premix  Status:  Discontinued        2 g 200 mL/hr over 30 Minutes Intravenous Every 12 hours 08/22/22 0048 08/22/22 1250   08/21/22 2015  ceFEPIme (MAXIPIME) 2 g in sodium chloride 0.9 % 100 mL IVPB        2 g 200 mL/hr over 30 Minutes Intravenous  Once 08/21/22 2003 08/21/22 2113   08/21/22 2015  vancomycin (VANCOCIN) IVPB 1000 mg/200 mL premix        1,000 mg 200 mL/hr over 60 Minutes Intravenous  Once 08/21/22 2011 08/21/22 2222        Subjective: Patient was seen and examined at bedside.  Overnight events noted.   Patient reports feeling much improved. She  remains on 1.5 L of supplemental oxygen. She denies any chest pain or shortness of breath.She reports that she is keeping herself busy doing puzzles.  Objective: Vitals:   08/27/22 0153 08/27/22 0637 08/27/22 0800 08/27/22 0946  BP: 137/86 (!) 143/73  134/65  Pulse: 78 78  88  Resp: '17 17  18  '$ Temp: 98.1 F (36.7 C) 97.7 F (36.5 C)  97.9 F (36.6 C)  TempSrc: Oral Oral    SpO2: 99% 98% 99% 98%  Weight:      Height:        Intake/Output Summary (Last 24 hours) at 08/27/2022 1153 Last data filed at 08/27/2022 1000 Gross per 24 hour  Intake 420 ml  Output 1000 ml  Net -580 ml   Filed  Weights   08/21/22 1850  Weight: 60.8 kg    Examination:  General exam: Appears comfortable, not in any acute distress, deconditioned. Respiratory system: CTA bilaterally, no wheezing, no crackles, normal respiratory effort. Cardiovascular system: S1 & S2 heard, regular rate and rhythm, no murmur. Gastrointestinal system: Abdomen is soft, non tender, non distended, BS +. Central nervous system: Alert and oriented x 3. No focal neurological deficits. Extremities: No edema, no cyanosis, no clubbing Skin: No rashes, lesions or ulcers Psychiatry: Judgement and insight appear normal. Mood & affect appropriate.     Data Reviewed: I have personally reviewed following labs and imaging studies  CBC: Recent Labs  Lab 08/21/22 1947 08/23/22 0330 08/25/22 0343 08/26/22 0326  WBC 7.9 5.2 5.4 6.3  NEUTROABS 5.8  --   --   --   HGB 14.2 12.4 11.5* 12.6  HCT 43.0 38.4 34.8* 37.9  MCV 94.1 95.8 94.6 92.0  PLT 152 121* 141* 580   Basic Metabolic Panel: Recent Labs  Lab 08/21/22 1947 08/22/22 0540 08/23/22 0330 08/25/22 0343 08/26/22 0326  NA 134* 137 136 131* 134*  K 4.0 4.6 4.1 4.6 4.5  CL 102 104 102 99 101  CO2 '23 25 26 25 26  '$ GLUCOSE 98 81 92 97 106*  BUN 25* '22 21 19 22  '$ CREATININE 1.19* 0.99 1.20* 0.88 0.84  CALCIUM 9.0 8.9 8.4* 8.4* 8.6*  MG 2.1  --   --   --   --    GFR: Estimated Creatinine Clearance: 37.7 mL/min (by C-G formula based on SCr of 0.84 mg/dL). Liver Function Tests: Recent Labs  Lab 08/21/22 1947 08/22/22 0540 08/23/22 0330 08/26/22 0326 08/27/22 0325  AST 95* 100* 82* 73* 44*  ALT 53* 52* 45* 52* 45*  ALKPHOS 51 43 41 58 51  BILITOT 1.4* 1.5* 1.0 1.0 0.8  PROT 6.9 6.0* 5.5* 6.2* 5.8*  ALBUMIN 3.8 3.2* 2.8* 2.9* 2.8*   No results for input(s): "LIPASE", "AMYLASE" in the last 168 hours. No results for input(s): "AMMONIA" in the last 168 hours. Coagulation Profile: Recent Labs  Lab 08/21/22 1947  INR 1.0   Cardiac Enzymes: Recent Labs   Lab 08/25/22 0343 08/26/22 0326  CKTOTAL 574* 453*   BNP (last 3 results) No results for input(s): "PROBNP" in the last 8760 hours. HbA1C: No results for input(s): "HGBA1C" in the last 72 hours. CBG: No results for input(s): "GLUCAP" in the last 168 hours. Lipid Profile: No results for input(s): "CHOL", "HDL", "LDLCALC", "TRIG", "CHOLHDL", "LDLDIRECT" in the last 72 hours. Thyroid Function Tests: No results for input(s): "TSH", "T4TOTAL", "FREET4", "T3FREE", "THYROIDAB" in the last 72 hours. Anemia Panel: No results for input(s): "VITAMINB12", "FOLATE", "FERRITIN", "TIBC", "IRON", "RETICCTPCT" in the  last 72 hours. Sepsis Labs: Recent Labs  Lab 08/21/22 2036  LATICACIDVEN 1.7    Recent Results (from the past 240 hour(s))  Blood culture (routine x 2)     Status: None   Collection Time: 08/21/22  7:45 PM   Specimen: BLOOD  Result Value Ref Range Status   Specimen Description   Final    BLOOD RIGHT ANTECUBITAL Performed at [facility] [address], [place], [place] [address]    Special Requests   Final    BOTTLES DRAWN AEROBIC AND ANAEROBIC Blood Culture adequate volume Performed at [facility] [address], [place], [place] [address]    Culture   Final    NO GROWTH 5 DAYS Performed at [facility], [place] [address] [place], [place] [address]    Report Status 08/26/2022 FINAL  Final  Blood culture (routine x 2)     Status: None (Preliminary result)   Collection Time: 08/21/22 10:32 PM   Specimen: BLOOD  Result Value Ref Range Status   Specimen Description   Final    BLOOD LEFT ANTECUBITAL Performed at [facility] [address], [place], [place] [address]    Special Requests   Final    BOTTLES DRAWN AEROBIC AND ANAEROBIC Blood Culture adequate volume Performed at [facility] [address], [place], [place] [address]    Culture   Final    NO GROWTH 4 DAYS Performed at  [facility], [place] [address], [place], [place] [address]    Report Status PENDING  Incomplete  Resp Panel by RT-PCR (Flu A&B, Covid) Anterior Nasal Swab     Status: Abnormal   Collection Time: 08/22/22  2:17 PM   Specimen: Anterior Nasal Swab  Result Value Ref Range Status   SARS Coronavirus 2 by RT PCR POSITIVE (A) NEGATIVE Final    Comment: (NOTE) SARS-CoV-2 target nucleic acids are DETECTED.  The SARS-CoV-2 RNA is generally detectable in upper respiratory specimens during the acute phase of infection. Positive results are indicative of the presence of the identified virus, but do not rule out bacterial infection or co-infection with other pathogens not detected by the test. Clinical correlation with patient history and other diagnostic information is necessary to determine patient infection status. The expected result is Negative.  Fact Sheet for Patients: EntrepreneurPulse.com.au  Fact Sheet for Healthcare Providers: IncredibleEmployment.be  This test is not yet approved or cleared by the [place] FDA and  has been authorized for detection and/or diagnosis of SARS-CoV-2 by FDA under an Emergency Use Authorization (EUA).  This EUA will remain in effect (meaning this test can be used) for the duration of  the COVID-19 declaration under Section 564(b)(1) of the A ct, 21 U.S.C. section 360bbb-3(b)(1), unless the authorization is terminated or revoked sooner.     Influenza A by PCR NEGATIVE NEGATIVE Final   Influenza B by PCR NEGATIVE NEGATIVE Final    Comment: (NOTE) The Xpert Xpress SARS-CoV-2/FLU/RSV plus assay is intended as an aid in the diagnosis of influenza from Nasopharyngeal swab specimens and should not be used as a sole basis for treatment. Nasal washings and aspirates are unacceptable for Xpert Xpress SARS-CoV-2/FLU/RSV testing.  Fact Sheet for Patients: EntrepreneurPulse.com.au  Fact Sheet for  Healthcare Providers: IncredibleEmployment.be  This test is not yet approved or cleared by the [place] FDA and has been authorized for detection and/or diagnosis of SARS-CoV-2 by FDA under an Emergency Use Authorization (EUA).  This EUA will remain in effect (meaning this test can be used) for the duration of the COVID-19 declaration under Section 564(b)(1) of the Act, 21 U.S.C. section 360bbb-3(b)(1), unless the authorization is terminated or revoked.  Performed at [facility], [place] [address] [place], [place] [address]   Respiratory (~20 pathogens) panel by PCR     Status: None   Collection Time: 08/22/22  2:17 PM   Specimen: Anterior Nasal Swab; Respiratory  Result Value Ref Range Status   Adenovirus NOT DETECTED NOT DETECTED Final   Coronavirus 229E NOT DETECTED NOT DETECTED Final    Comment: (NOTE) The Coronavirus on the Respiratory Panel, DOES NOT test for the novel  Coronavirus (2019 nCoV)    Coronavirus HKU1 NOT DETECTED NOT DETECTED Final   Coronavirus NL63 NOT DETECTED NOT DETECTED Final   Coronavirus OC43 NOT DETECTED NOT DETECTED Final   Metapneumovirus NOT DETECTED NOT DETECTED Final   Rhinovirus / Enterovirus NOT DETECTED NOT DETECTED Final   Influenza A NOT DETECTED NOT DETECTED Final   Influenza B NOT DETECTED NOT DETECTED Final   Parainfluenza Virus 1 NOT DETECTED NOT DETECTED Final   Parainfluenza Virus 2 NOT DETECTED NOT DETECTED Final   Parainfluenza Virus 3 NOT DETECTED NOT DETECTED Final   Parainfluenza Virus 4 NOT DETECTED NOT DETECTED Final   Respiratory Syncytial Virus NOT DETECTED NOT DETECTED Final   Bordetella pertussis NOT DETECTED NOT DETECTED Final   Bordetella Parapertussis NOT DETECTED NOT DETECTED Final   Chlamydophila pneumoniae NOT DETECTED NOT DETECTED Final   Mycoplasma pneumoniae NOT DETECTED NOT DETECTED Final    Comment: Performed at [facility], [address], [place], [place]  [address]    Radiology Studies: No results found.  Scheduled Meds:  amLODipine  5 mg Oral Daily   aspirin EC  81 mg Oral Daily   enoxaparin (LOVENOX) injection  30 mg Subcutaneous Q24H   fluticasone furoate-vilanterol  1 puff Inhalation Daily   ipratropium-albuterol  3 mL Nebulization BID   levothyroxine  25 mcg Oral Q0600   polyethylene glycol  17 g Oral Daily   predniSONE  40 mg Oral Q breakfast   senna-docusate  1 tablet Oral BID   Continuous Infusions:   LOS: 6 days    Time spent: 35 mins    Mckynzie Liwanag, MD [facility]   If 7PM-7AM, please contact night-coverage

## 2022-08-27 NOTE — Plan of Care (Signed)
  Problem: Education: Goal: Knowledge of General Education information will improve Description Including pain rating scale, medication(s)/side effects and non-pharmacologic comfort measures Outcome: Progressing   

## 2022-08-28 DIAGNOSIS — U071 COVID-19: Secondary | ICD-10-CM | POA: Diagnosis not present

## 2022-08-28 LAB — COMPREHENSIVE METABOLIC PANEL
ALT: 44 U/L (ref 0–44)
AST: 34 U/L (ref 15–41)
Albumin: 2.9 g/dL — ABNORMAL LOW (ref 3.5–5.0)
Alkaline Phosphatase: 50 U/L (ref 38–126)
Anion gap: 8 (ref 5–15)
BUN: 20 mg/dL (ref 8–23)
CO2: 25 mmol/L (ref 22–32)
Calcium: 8.8 mg/dL — ABNORMAL LOW (ref 8.9–10.3)
Chloride: 102 mmol/L (ref 98–111)
Creatinine, Ser: 0.74 mg/dL (ref 0.44–1.00)
GFR, Estimated: >60 mL/min (ref >60)
Glucose, Bld: 86 mg/dL (ref 70–99)
Potassium: 4.2 mmol/L (ref 3.5–5.1)
Sodium: 135 mmol/L (ref 135–145)
Total Bilirubin: 0.7 mg/dL (ref 0.3–1.2)
Total Protein: 6 g/dL — ABNORMAL LOW (ref 6.5–8.1)

## 2022-08-28 MED ORDER — LIP MEDEX EX OINT: TOPICAL_OINTMENT | CUTANEOUS | Status: DC | PRN

## 2022-08-28 MED ORDER — METHOCARBAMOL 500 MG PO TABS
500.0000 mg | ORAL_TABLET | Freq: Three times a day (TID) | ORAL | Status: DC
  Administered 2022-08-28 – 2022-09-02 (×15): 500 mg via ORAL
  Filled 2022-08-28 (×15): qty 1

## 2022-08-28 NOTE — Progress Notes (Signed)
PROGRESS NOTE    Marilyn Hobbs  NTZ:[id number] DOB: May 05, 1929 DOA: 08/21/2022  PCP: Janith Lima, MD   Brief Narrative:  This 88 years old female with PMH significant for hypertension, COPD not on home oxygen, hyperlipidemia presented in the ED with generalized weakness and falls.  In the ER patient was noted to have cellulitis of right great toe.  MRI negative for osteomyelitis.  She was started on empiric antibiotics.  Later she was tested positive for COVID started on paxloid and antibiotics were discontinued.  PT recommended SNF.  Assessment & Plan:   Principal Problem:   COVID Active Problems:   COPD with acute exacerbation ([place])   Essential hypertension   Polycythemia vera ([place])   Confusion   Stage 3a chronic kidney disease (CKD) ([place])   Fall at home, initial encounter   Myocardial injury   Transaminitis   Ovarian cyst   Hypothyroidism   Rhabdomyolysis  Acute hypoxic respiratory failure: COVID+ Patient presented with generalized weakness and falls. Tested positive for COVID  and started on Paxlovid. Completed Paxlovid for 5 days. PT and OT recommended SNF.  Awaiting. Continue supplemental oxygen and wean as tolerated. She is weaned down to 1.5 L/ min  COPD with acute exacerbation Continue prednisone. Continue scheduled bronchodilators.   Now wheezing has resolved.  Hypothyroidism: Continue levothyroxine 25 micrograms daily  Essential hypertension: Continue amlodipine 2.5 mg daily  DVT prophylaxis: Lovenox Code Status: Full code. Family Communication:No family at bed side. Disposition Plan:    Status is: Inpatient Remains inpatient appropriate because: Admitted for generalized weakness and falls,  found to be COVID +, started on Paxlovid.  PT recommended SNF.  Patient either can go home with home health services or have to wait until 10/16 to be discharged to SNF. Daughter wants patient to be discharged to SNF   Consultants:   None  Procedures:None.  Antimicrobials:  Anti-infectives (From admission, onward)    Start     Dose/Rate Route Frequency Ordered Stop   08/23/22 2100  vancomycin (VANCOREADY) IVPB 750 mg/150 mL  Status:  Discontinued        750 mg 150 mL/hr over 60 Minutes Intravenous Every 48 hours 08/21/22 2049 08/22/22 0038   08/23/22 1000  cefadroxil (DURICEF) capsule 500 mg  Status:  Discontinued        500 mg Oral 2 times daily 08/23/22 0737 08/23/22 1452   08/22/22 2200  nirmatrelvir/ritonavir EUA (PAXLOVID) 3 tablet        3 tablet Oral 2 times daily 08/22/22 1555 08/27/22 0849   08/22/22 1800  ceFAZolin (ANCEF) IVPB 2g/100 mL premix  Status:  Discontinued        2 g 200 mL/hr over 30 Minutes Intravenous Every 8 hours 08/22/22 1250 08/23/22 0737   08/22/22 1000  ceFAZolin (ANCEF) IVPB 2g/100 mL premix  Status:  Discontinued        2 g 200 mL/hr over 30 Minutes Intravenous Every 12 hours 08/22/22 0048 08/22/22 1250   08/21/22 2015  ceFEPIme (MAXIPIME) 2 g in sodium chloride 0.9 % 100 mL IVPB        2 g 200 mL/hr over 30 Minutes Intravenous  Once 08/21/22 2003 08/21/22 2113   08/21/22 2015  vancomycin (VANCOCIN) IVPB 1000 mg/200 mL premix        1,000 mg 200 mL/hr over 60 Minutes Intravenous  Once 08/21/22 2011 08/21/22 2222        Subjective: Patient was seen and examined at bedside.  Overnight events noted.  Patient reports feeling much improved.  She is requiring 1.5 L of supplemental oxygen. She denies any chest pain or shortness of breath. She reports that she is keeping herself busy doing puzzles.  Objective: Vitals:   08/27/22 1924 08/27/22 2054 08/28/22 0637 08/28/22 0945  BP:  (!) 165/78 (!) 151/89   Pulse:  90 76   Resp:  18 18   Temp:  (!) 97.3 F (36.3 C) 97.8 F (36.6 C)   TempSrc:  Oral Oral   SpO2: 97% 99% 98% 99%  Weight:      Height:        Intake/Output Summary (Last 24 hours) at 08/28/2022 1245 Last data filed at 08/28/2022 0656 Gross per 24 hour   Intake 600 ml  Output 750 ml  Net -150 ml   Filed Weights   08/21/22 1850  Weight: 60.8 kg    Examination:  General exam: Appears comfortable, not in any acute distress, deconditioned. Respiratory system: CTA bilaterally, no wheezing, no crackles, normal respiratory effort. Cardiovascular system: S1 & S2 heard, regular rate and rhythm, no murmur. Gastrointestinal system: Abdomen is soft, non tender, non distended, BS+ Central nervous system: Alert and oriented x 3. No focal neurological deficits. Extremities: No edema, no cyanosis, no clubbing Skin: No rashes, lesions or ulcers Psychiatry: Judgement and insight appear normal. Mood & affect appropriate.     Data Reviewed: I have personally reviewed following labs and imaging studies  CBC: Recent Labs  Lab 08/21/22 1947 08/23/22 0330 08/25/22 0343 08/26/22 0326  WBC 7.9 5.2 5.4 6.3  NEUTROABS 5.8  --   --   --   HGB 14.2 12.4 11.5* 12.6  HCT 43.0 38.4 34.8* 37.9  MCV 94.1 95.8 94.6 92.0  PLT 152 121* 141* 791   Basic Metabolic Panel: Recent Labs  Lab 08/21/22 1947 08/22/22 0540 08/23/22 0330 08/25/22 0343 08/26/22 0326 08/28/22 0322  NA 134* 137 136 131* 134* 135  K 4.0 4.6 4.1 4.6 4.5 4.2  CL 102 104 102 99 101 102  CO2 '23 25 26 25 26 25  '$ GLUCOSE 98 81 92 97 106* 86  BUN 25* '22 21 19 22 20  '$ CREATININE 1.19* 0.99 1.20* 0.88 0.84 0.74  CALCIUM 9.0 8.9 8.4* 8.4* 8.6* 8.8*  MG 2.1  --   --   --   --   --    GFR: Estimated Creatinine Clearance: 39.6 mL/min (by C-G formula based on SCr of 0.74 mg/dL). Liver Function Tests: Recent Labs  Lab 08/22/22 0540 08/23/22 0330 08/26/22 0326 08/27/22 0325 08/28/22 0322  AST 100* 82* 73* 44* 34  ALT 52* 45* 52* 45* 44  ALKPHOS 43 41 58 51 50  BILITOT 1.5* 1.0 1.0 0.8 0.7  PROT 6.0* 5.5* 6.2* 5.8* 6.0*  ALBUMIN 3.2* 2.8* 2.9* 2.8* 2.9*   No results for input(s): "LIPASE", "AMYLASE" in the last 168 hours. No results for input(s): "AMMONIA" in the last 168  hours. Coagulation Profile: Recent Labs  Lab 08/21/22 1947  INR 1.0   Cardiac Enzymes: Recent Labs  Lab 08/25/22 0343 08/26/22 0326  CKTOTAL 574* 453*   BNP (last 3 results) No results for input(s): "PROBNP" in the last 8760 hours. HbA1C: No results for input(s): "HGBA1C" in the last 72 hours. CBG: No results for input(s): "GLUCAP" in the last 168 hours. Lipid Profile: No results for input(s): "CHOL", "HDL", "LDLCALC", "TRIG", "CHOLHDL", "LDLDIRECT" in the last 72 hours. Thyroid Function Tests: No results for input(s): "TSH", "T4TOTAL", "FREET4", "T3FREE", "THYROIDAB"  in the last 72 hours. Anemia Panel: No results for input(s): "VITAMINB12", "FOLATE", "FERRITIN", "TIBC", "IRON", "RETICCTPCT" in the last 72 hours. Sepsis Labs: Recent Labs  Lab 08/21/22 2036  LATICACIDVEN 1.7    Recent Results (from the past 240 hour(s))  Blood culture (routine x 2)     Status: None   Collection Time: 08/21/22  7:45 PM   Specimen: BLOOD  Result Value Ref Range Status   Specimen Description   Final    BLOOD RIGHT ANTECUBITAL Performed at [facility] [address] [place], [place] [address]    Special Requests   Final    BOTTLES DRAWN AEROBIC AND ANAEROBIC Blood Culture adequate volume Performed at [facility] [address], [place], [place] [address]    Culture   Final    NO GROWTH 5 DAYS Performed at [facility], [place] [address], [place], [place] [address]    Report Status 08/26/2022 FINAL  Final  Blood culture (routine x 2)     Status: None   Collection Time: 08/21/22 10:32 PM   Specimen: BLOOD  Result Value Ref Range Status   Specimen Description   Final    BLOOD LEFT ANTECUBITAL Performed at [facility] [address] [place], [place] [address]    Special Requests   Final    BOTTLES DRAWN AEROBIC AND ANAEROBIC Blood Culture adequate volume Performed at [facility] [address] [place], [place] [address]    Culture   Final    NO GROWTH 5 DAYS Performed at [facility], [place] [address], [place], [place] [address]    Report Status 08/27/2022 FINAL  Final  Resp Panel by RT-PCR (Flu A&B, Covid) Anterior Nasal Swab     Status: Abnormal   Collection Time: 08/22/22  2:17 PM   Specimen: Anterior Nasal Swab  Result Value Ref Range Status   SARS Coronavirus 2 by RT PCR POSITIVE (A) NEGATIVE Final    Comment: (NOTE) SARS-CoV-2 target nucleic acids are DETECTED.  The SARS-CoV-2 RNA is generally detectable in upper respiratory specimens during the acute phase of infection. Positive results are indicative of the presence of the identified virus, but do not rule out bacterial infection or co-infection with other pathogens not detected by the test. Clinical correlation with patient history and other diagnostic information is necessary to determine patient infection status. The expected result is Negative.  Fact Sheet for Patients: EntrepreneurPulse.com.au  Fact Sheet for Healthcare Providers: IncredibleEmployment.be  This test is not yet approved or cleared by the [place] FDA and  has been authorized for detection and/or diagnosis of SARS-CoV-2 by FDA under an Emergency Use Authorization (EUA).  This EUA will remain in effect (meaning this test can be used) for the duration of  the COVID-19 declaration under Section 564(b)(1) of the A ct, 21 U.S.C. section 360bbb-3(b)(1), unless the authorization is terminated or revoked sooner.     Influenza A by PCR NEGATIVE NEGATIVE Final   Influenza B by PCR NEGATIVE NEGATIVE Final    Comment: (NOTE) The Xpert Xpress SARS-CoV-2/FLU/RSV plus assay is intended as an aid in the diagnosis of influenza from Nasopharyngeal swab specimens and should not be used as a sole basis for treatment. Nasal washings and aspirates are unacceptable for Xpert Xpress  SARS-CoV-2/FLU/RSV testing.  Fact Sheet for Patients: EntrepreneurPulse.com.au  Fact Sheet for Healthcare Providers: IncredibleEmployment.be  This test is not yet approved or cleared by the [place] FDA  and has been authorized for detection and/or diagnosis of SARS-CoV-2 by FDA under an Emergency Use Authorization (EUA). This EUA will remain in effect (meaning this test can be used) for the duration of the COVID-19 declaration under Section 564(b)(1) of the Act, 21 U.S.C. section 360bbb-3(b)(1), unless the authorization is terminated or revoked.  Performed at [facility], [place] [address] [place], [place] [address]   Respiratory (~20 pathogens) panel by PCR     Status: None   Collection Time: 08/22/22  2:17 PM   Specimen: Anterior Nasal Swab; Respiratory  Result Value Ref Range Status   Adenovirus NOT DETECTED NOT DETECTED Final   Coronavirus 229E NOT DETECTED NOT DETECTED Final    Comment: (NOTE) The Coronavirus on the Respiratory Panel, DOES NOT test for the novel  Coronavirus (2019 nCoV)    Coronavirus HKU1 NOT DETECTED NOT DETECTED Final   Coronavirus NL63 NOT DETECTED NOT DETECTED Final   Coronavirus OC43 NOT DETECTED NOT DETECTED Final   Metapneumovirus NOT DETECTED NOT DETECTED Final   Rhinovirus / Enterovirus NOT DETECTED NOT DETECTED Final   Influenza A NOT DETECTED NOT DETECTED Final   Influenza B NOT DETECTED NOT DETECTED Final   Parainfluenza Virus 1 NOT DETECTED NOT DETECTED Final   Parainfluenza Virus 2 NOT DETECTED NOT DETECTED Final   Parainfluenza Virus 3 NOT DETECTED NOT DETECTED Final   Parainfluenza Virus 4 NOT DETECTED NOT DETECTED Final   Respiratory Syncytial Virus NOT DETECTED NOT DETECTED Final   Bordetella pertussis NOT DETECTED NOT DETECTED Final   Bordetella Parapertussis NOT DETECTED NOT DETECTED Final   Chlamydophila pneumoniae NOT DETECTED NOT DETECTED Final   Mycoplasma pneumoniae  NOT DETECTED NOT DETECTED Final    Comment: Performed at [facility], [address], [place],  [address]    Radiology Studies: No results found.  Scheduled Meds:  amLODipine  5 mg Oral Daily   aspirin EC  81 mg Oral Daily   enoxaparin (LOVENOX) injection  30 mg Subcutaneous Q24H   fluticasone furoate-vilanterol  1 puff Inhalation Daily   ipratropium-albuterol  3 mL Nebulization BID   levothyroxine  25 mcg Oral Q0600   methocarbamol  500 mg Oral TID   polyethylene glycol  17 g Oral Daily   senna-docusate  1 tablet Oral BID   Continuous Infusions:   LOS: 7 days    Time spent: 35 mins    Tamarick Kovalcik, MD [facility]   If 7PM-7AM, please contact night-coverage

## 2022-08-29 DIAGNOSIS — U071 COVID-19: Secondary | ICD-10-CM | POA: Diagnosis not present

## 2022-08-29 MED ORDER — ALPRAZOLAM 0.25 MG PO TABS
0.2500 mg | ORAL_TABLET | Freq: Once | ORAL | Status: AC
  Administered 2022-08-29: 0.25 mg via ORAL
  Filled 2022-08-29: qty 1

## 2022-08-29 NOTE — Care Management Important Message (Signed)
Important Message  Patient Details IM Letter given  Name: Marilyn Hobbs MRN: [id number] Date of Birth: [birth year]   Medicare Important Message Given:  Yes     Kerin Salen 08/29/2022, 12:49 PM

## 2022-08-29 NOTE — Progress Notes (Signed)
Physical Therapy Treatment Patient Details Name: Marilyn Hobbs MRN: [id number] DOB: 1929-11-08 Today's Date: 08/29/2022   History of Present Illness 88 yo female with weakness and falls, noted to have cellulitis of the right great toe, also Covid +  MRI negative for osteomyelitis.    PMH: HTN, hypothyroidism, COPD, hyperlipidemia, polycthemia vera    PT Comments    General Comments: AxO x 2 pleasant Lady following repeat functional commands.  Poor ST memory. High anxiety/great fear of falling. Assisted OOB to [facility] then to recliner required increased time and effort.  General bed mobility comments: required increased time and Max Assist using bed pad to complete scooting. Anxiety.  Fearful of falling. General transfer comment: assisted from bed to [facility] 1/4 pivot was difficult due to pt's anxiety/fear of falling.  First performed a "Bear Hug" then second pt was able to perform sit to stand while holding B UE's on elvated bed rail.  I move BSC such that pt was full facing bed rail.  Pt was able to pull herself up x 4 sit to stands while holding rail. Positioned in recliner with multiple pillows. Pt will need ST Rehab at SNF to address mobility and functional decline prior to safely returning home.   Recommendations for follow up therapy are one component of a multi-disciplinary discharge planning process, led by the attending physician.  Recommendations may be updated based on patient status, additional functional criteria and insurance authorization.  Follow Up Recommendations  Skilled nursing-short term rehab (<3 hours/day) Can patient physically be transported by private vehicle: No   Assistance Recommended at Discharge Frequent or constant Supervision/Assistance  Patient can return home with the following Two people to help with walking and/or transfers;Two people to help with bathing/dressing/bathroom;Assist for transportation;Help with stairs or ramp for entrance;Assistance with cooking/housework    Equipment Recommendations  None recommended by PT    Recommendations for Other Services       Precautions / Restrictions Precautions Precautions: Fall Restrictions Weight Bearing Restrictions: No     Mobility  Bed Mobility Overal bed mobility: Needs Assistance Bed Mobility: Supine to Sit     Supine to sit: Max assist     General bed mobility comments: required increased time and Max Assist using bed pad to complete scooting. Anxiety.  Fearful of falling.    Transfers Overall transfer level: Needs assistance Equipment used: None, Standard walker Transfers: Sit to/from Stand, Bed to chair/wheelchair/BSC Sit to Stand: Max assist Stand pivot transfers: Total assist, +2 physical assistance, +2 safety/equipment, From elevated surface         General transfer comment: assisted from bed to [facility] 1/4 pivot was difficult due to pt's anxiety/fear of falling.  First performed a "Bear Hug" then second pt was able to perform sit to stand while holding B UE's on elvated bed rail.  I move BSC such that pt was full facing bed rail.  Pt was able to pull herself up x 4 sit to stands while holding rail.    Ambulation/Gait               General Gait Details: unable due to HIGH anxiety/fear of falling   Stairs             Wheelchair Mobility    Modified Rankin (Stroke Patients Only)       Balance  Cognition Arousal/Alertness: Awake/alert Behavior During Therapy: WFL for tasks assessed/performed Overall Cognitive Status: No family/caregiver present to determine baseline cognitive functioning Area of Impairment: Problem solving, Memory, Following commands, Safety/judgement                     Memory: Decreased short-term memory Following Commands: Follows one step commands with increased time Safety/Judgement: Decreased awareness of deficits   Problem Solving: Slow processing, Decreased  initiation, Difficulty sequencing, Requires verbal cues, Requires tactile cues General Comments: AxO x 2 pleasant Lady following repeat functional commands.  Poor ST memory. High anxiety/great fear of falling.        Exercises      General Comments        Pertinent Vitals/Pain Pain Assessment Pain Assessment: Faces Faces Pain Scale: Hurts little more Pain Location: R posterior lateral back (flank) Pain Descriptors / Indicators: Aching, Grimacing Pain Intervention(s): Monitored during session, Repositioned    Home Living                          Prior Function            PT Goals (current goals can now be found in the care plan section) Progress towards PT goals: Progressing toward goals    Frequency    Min 2X/week      PT Plan Current plan remains appropriate    Co-evaluation              AM-PAC PT "6 Clicks" Mobility   Outcome Measure  Help needed turning from your back to your side while in a flat bed without using bedrails?: A Lot Help needed moving from lying on your back to sitting on the side of a flat bed without using bedrails?: A Lot Help needed moving to and from a bed to a chair (including a wheelchair)?: A Lot Help needed standing up from a chair using your arms (e.g., wheelchair or bedside chair)?: A Lot Help needed to walk in hospital room?: A Lot Help needed climbing 3-5 steps with a railing? : A Lot 6 Click Score: 12    End of Session Equipment Utilized During Treatment: Gait belt Activity Tolerance: Patient tolerated treatment well Patient left: in chair;with chair alarm set;with call bell/phone within reach Nurse Communication: Mobility status PT Visit Diagnosis: Difficulty in walking, not elsewhere classified (R26.2);Other abnormalities of gait and mobility (R26.89);Muscle weakness (generalized) (M62.81)     Time: 2725-3664 PT Time Calculation (min) (ACUTE ONLY): 29 min  Charges:  $Therapeutic Activity: 23-37  mins                     Rica Koyanagi  PTA Acute  Rehabilitation Services Office M-F          [phone] Weekend pager [phone]

## 2022-08-29 NOTE — Progress Notes (Signed)
PROGRESS NOTE    Marilyn Hobbs  TKP:[id number] DOB: 02-Sep-1929 DOA: 08/21/2022  PCP: Janith Lima, MD   Brief Narrative:  This 88 years old female with PMH significant for hypertension, COPD not on home oxygen, hyperlipidemia presented in the ED with generalized weakness and falls.  In the ER patient was noted to have cellulitis of right great toe.  MRI negative for osteomyelitis.  She was started on empiric antibiotics.  Later she was tested positive for COVID started on paxloid and antibiotics were discontinued.  PT recommended SNF.  Assessment & Plan:   Principal Problem:   COVID Active Problems:   COPD with acute exacerbation ([place])   Essential hypertension   Polycythemia vera ([place])   Confusion   Stage 3a chronic kidney disease (CKD) ([place])   Fall at home, initial encounter   Myocardial injury   Transaminitis   Ovarian cyst   Hypothyroidism   Rhabdomyolysis  Acute hypoxic respiratory failure: > Improving COVID+ Patient presented with generalized weakness and falls. Tested positive for COVID  and started on Paxlovid. Completed Paxlovid for 5 days. PT and OT recommended SNF.  Awaiting placement. Continue supplemental oxygen and wean as tolerated. She is weaned down to 1 L/ min.  COPD with acute exacerbation Continue prednisone. Continue scheduled bronchodilators.   Now wheezing has resolved.  Hypothyroidism: Continue levothyroxine 25 micrograms daily  Essential hypertension: Continue amlodipine 2.5 mg daily  DVT prophylaxis: Lovenox Code Status: Full code. Family Communication:No family at bed side. Disposition Plan:    Status is: Inpatient Remains inpatient appropriate because: Admitted for generalized weakness and falls,  found to be COVID +, started on Paxlovid.  PT recommended SNF.  Patient either can go home with home health services or have to wait until 10/16 to be discharged to SNF. Daughter wants patient to be discharged to SNF   Consultants:   None  Procedures:None.  Antimicrobials:  Anti-infectives (From admission, onward)    Start     Dose/Rate Route Frequency Ordered Stop   08/23/22 2100  vancomycin (VANCOREADY) IVPB 750 mg/150 mL  Status:  Discontinued        750 mg 150 mL/hr over 60 Minutes Intravenous Every 48 hours 08/21/22 2049 08/22/22 0038   08/23/22 1000  cefadroxil (DURICEF) capsule 500 mg  Status:  Discontinued        500 mg Oral 2 times daily 08/23/22 0737 08/23/22 1452   08/22/22 2200  nirmatrelvir/ritonavir EUA (PAXLOVID) 3 tablet        3 tablet Oral 2 times daily 08/22/22 1555 08/27/22 0849   08/22/22 1800  ceFAZolin (ANCEF) IVPB 2g/100 mL premix  Status:  Discontinued        2 g 200 mL/hr over 30 Minutes Intravenous Every 8 hours 08/22/22 1250 08/23/22 0737   08/22/22 1000  ceFAZolin (ANCEF) IVPB 2g/100 mL premix  Status:  Discontinued        2 g 200 mL/hr over 30 Minutes Intravenous Every 12 hours 08/22/22 0048 08/22/22 1250   08/21/22 2015  ceFEPIme (MAXIPIME) 2 g in sodium chloride 0.9 % 100 mL IVPB        2 g 200 mL/hr over 30 Minutes Intravenous  Once 08/21/22 2003 08/21/22 2113   08/21/22 2015  vancomycin (VANCOCIN) IVPB 1000 mg/200 mL premix        1,000 mg 200 mL/hr over 60 Minutes Intravenous  Once 08/21/22 2011 08/21/22 2222        Subjective: Patient was seen and examined at bedside.  Overnight events noted.   Patient reports feeling much improved.  She is requiring 1 L of supplemental oxygen. She is keeping herself busy doing puzzles.  She denies any chest pain or shortness of breath.  Objective: Vitals:   08/28/22 2240 08/29/22 0607 08/29/22 0745 08/29/22 0806  BP: 120/68 (!) 164/87    Pulse: 72 75 72   Resp: 13 15    Temp: 97.6 F (36.4 C) 98.2 F (36.8 C)    TempSrc:      SpO2: 98% 99% 100% 99%  Weight:      Height:        Intake/Output Summary (Last 24 hours) at 08/29/2022 1314 Last data filed at 08/29/2022 0600 Gross per 24 hour  Intake 300 ml  Output 950 ml  Net  -650 ml   Filed Weights   08/21/22 1850  Weight: 60.8 kg    Examination:  General exam: Appears comfortable, NAD, deconditioned. Respiratory system: CTA bilaterally, No wheezing, No crackles, normal respiratory effort. Cardiovascular system: S1 & S2 heard, regular rate and rhythm, no murmur. Gastrointestinal system: Abdomen is soft, non tender, non distended, BS+ Central nervous system: Alert and oriented x3, no focal neurological deficits. Extremities: No edema, no cyanosis, no clubbing Skin: No rashes, lesions or ulcers Psychiatry: Judgement and insight appear normal. Mood & affect appropriate.     Data Reviewed: I have personally reviewed following labs and imaging studies  CBC: Recent Labs  Lab 08/23/22 0330 08/25/22 0343 08/26/22 0326  WBC 5.2 5.4 6.3  HGB 12.4 11.5* 12.6  HCT 38.4 34.8* 37.9  MCV 95.8 94.6 92.0  PLT 121* 141* 825   Basic Metabolic Panel: Recent Labs  Lab 08/23/22 0330 08/25/22 0343 08/26/22 0326 08/28/22 0322  NA 136 131* 134* 135  K 4.1 4.6 4.5 4.2  CL 102 99 101 102  CO2 '26 25 26 25  '$ GLUCOSE 92 97 106* 86  BUN '21 19 22 20  '$ CREATININE 1.20* 0.88 0.84 0.74  CALCIUM 8.4* 8.4* 8.6* 8.8*   GFR: Estimated Creatinine Clearance: 39.6 mL/min (by C-G formula based on SCr of 0.74 mg/dL). Liver Function Tests: Recent Labs  Lab 08/23/22 0330 08/26/22 0326 08/27/22 0325 08/28/22 0322  AST 82* 73* 44* 34  ALT 45* 52* 45* 44  ALKPHOS 41 58 51 50  BILITOT 1.0 1.0 0.8 0.7  PROT 5.5* 6.2* 5.8* 6.0*  ALBUMIN 2.8* 2.9* 2.8* 2.9*   No results for input(s): "LIPASE", "AMYLASE" in the last 168 hours. No results for input(s): "AMMONIA" in the last 168 hours. Coagulation Profile: No results for input(s): "INR", "PROTIME" in the last 168 hours.  Cardiac Enzymes: Recent Labs  Lab 08/25/22 0343 08/26/22 0326  CKTOTAL 574* 453*   BNP (last 3 results) No results for input(s): "PROBNP" in the last 8760 hours. HbA1C: No results for input(s):  "HGBA1C" in the last 72 hours. CBG: No results for input(s): "GLUCAP" in the last 168 hours. Lipid Profile: No results for input(s): "CHOL", "HDL", "LDLCALC", "TRIG", "CHOLHDL", "LDLDIRECT" in the last 72 hours. Thyroid Function Tests: No results for input(s): "TSH", "T4TOTAL", "FREET4", "T3FREE", "THYROIDAB" in the last 72 hours. Anemia Panel: No results for input(s): "VITAMINB12", "FOLATE", "FERRITIN", "TIBC", "IRON", "RETICCTPCT" in the last 72 hours. Sepsis Labs: No results for input(s): "PROCALCITON", "LATICACIDVEN" in the last 168 hours.   Recent Results (from the past 240 hour(s))  Blood culture (routine x 2)     Status: None   Collection Time: 08/21/22  7:45 PM   Specimen: BLOOD  Result Value Ref Range Status   Specimen Description   Final    BLOOD RIGHT ANTECUBITAL Performed at [facility] [address], [place], [place] [address]    Special Requests   Final    BOTTLES DRAWN AEROBIC AND ANAEROBIC Blood Culture adequate volume Performed at [facility] [address], [place], [place] [address]    Culture   Final    NO GROWTH 5 DAYS Performed at [facility], [place] [address] [place], [place] [address]    Report Status 08/26/2022 FINAL  Final  Blood culture (routine x 2)     Status: None   Collection Time: 08/21/22 10:32 PM   Specimen: BLOOD  Result Value Ref Range Status   Specimen Description   Final    BLOOD LEFT ANTECUBITAL Performed at [facility] [address], [place], [place] [address]    Special Requests   Final    BOTTLES DRAWN AEROBIC AND ANAEROBIC Blood Culture adequate volume Performed at [facility] [address] [place], [place] [address]    Culture   Final    NO GROWTH 5 DAYS Performed at [facility], [place] [address] [place], [place] [address]    Report Status 08/27/2022 FINAL  Final  Resp Panel by RT-PCR (Flu A&B, Covid) Anterior Nasal Swab      Status: Abnormal   Collection Time: 08/22/22  2:17 PM   Specimen: Anterior Nasal Swab  Result Value Ref Range Status   SARS Coronavirus 2 by RT PCR POSITIVE (A) NEGATIVE Final    Comment: (NOTE) SARS-CoV-2 target nucleic acids are DETECTED.  The SARS-CoV-2 RNA is generally detectable in upper respiratory specimens during the acute phase of infection. Positive results are indicative of the presence of the identified virus, but do not rule out bacterial infection or co-infection with other pathogens not detected by the test. Clinical correlation with patient history and other diagnostic information is necessary to determine patient infection status. The expected result is Negative.  Fact Sheet for Patients: EntrepreneurPulse.com.au  Fact Sheet for Healthcare Providers: IncredibleEmployment.be  This test is not yet approved or cleared by the [place] FDA and  has been authorized for detection and/or diagnosis of SARS-CoV-2 by FDA under an Emergency Use Authorization (EUA).  This EUA will remain in effect (meaning this test can be used) for the duration of  the COVID-19 declaration under Section 564(b)(1) of the A ct, 21 U.S.C. section 360bbb-3(b)(1), unless the authorization is terminated or revoked sooner.     Influenza A by PCR NEGATIVE NEGATIVE Final   Influenza B by PCR NEGATIVE NEGATIVE Final    Comment: (NOTE) The Xpert Xpress SARS-CoV-2/FLU/RSV plus assay is intended as an aid in the diagnosis of influenza from Nasopharyngeal swab specimens and should not be used as a sole basis for treatment. Nasal washings and aspirates are unacceptable for Xpert Xpress SARS-CoV-2/FLU/RSV testing.  Fact Sheet for Patients: EntrepreneurPulse.com.au  Fact Sheet for Healthcare Providers: IncredibleEmployment.be  This test is not yet approved or cleared by the [place] FDA and has been authorized for  detection and/or diagnosis of SARS-CoV-2 by FDA under an Emergency Use Authorization (EUA). This EUA will remain in effect (meaning this test can be used) for the duration of the COVID-19 declaration under Section 564(b)(1) of the Act, 21 U.S.C. section 360bbb-3(b)(1), unless the authorization is terminated or revoked.  Performed at [facility], [place] [address] [place],  [address]  Respiratory (~20 pathogens) panel by PCR     Status: None   Collection Time: 08/22/22  2:17 PM   Specimen: Anterior Nasal Swab; Respiratory  Result Value Ref Range Status   Adenovirus NOT DETECTED NOT DETECTED Final   Coronavirus 229E NOT DETECTED NOT DETECTED Final    Comment: (NOTE) The Coronavirus on the Respiratory Panel, DOES NOT test for the novel  Coronavirus (2019 nCoV)    Coronavirus HKU1 NOT DETECTED NOT DETECTED Final   Coronavirus NL63 NOT DETECTED NOT DETECTED Final   Coronavirus OC43 NOT DETECTED NOT DETECTED Final   Metapneumovirus NOT DETECTED NOT DETECTED Final   Rhinovirus / Enterovirus NOT DETECTED NOT DETECTED Final   Influenza A NOT DETECTED NOT DETECTED Final   Influenza B NOT DETECTED NOT DETECTED Final   Parainfluenza Virus 1 NOT DETECTED NOT DETECTED Final   Parainfluenza Virus 2 NOT DETECTED NOT DETECTED Final   Parainfluenza Virus 3 NOT DETECTED NOT DETECTED Final   Parainfluenza Virus 4 NOT DETECTED NOT DETECTED Final   Respiratory Syncytial Virus NOT DETECTED NOT DETECTED Final   Bordetella pertussis NOT DETECTED NOT DETECTED Final   Bordetella Parapertussis NOT DETECTED NOT DETECTED Final   Chlamydophila pneumoniae NOT DETECTED NOT DETECTED Final   Mycoplasma pneumoniae NOT DETECTED NOT DETECTED Final    Comment: Performed at [facility], [address] [place], [place] [address]    Radiology Studies: No results found.  Scheduled Meds:  amLODipine  5 mg Oral Daily   aspirin EC  81 mg Oral Daily   enoxaparin (LOVENOX) injection  30  mg Subcutaneous Q24H   fluticasone furoate-vilanterol  1 puff Inhalation Daily   ipratropium-albuterol  3 mL Nebulization BID   levothyroxine  25 mcg Oral Q0600   methocarbamol  500 mg Oral TID   polyethylene glycol  17 g Oral Daily   senna-docusate  1 tablet Oral BID   Continuous Infusions:   LOS: 8 days    Time spent: 35 mins    Shalondra Wunschel, MD [facility]   If 7PM-7AM, please contact night-coverage

## 2022-08-30 DIAGNOSIS — U071 COVID-19: Secondary | ICD-10-CM | POA: Diagnosis not present

## 2022-08-30 MED ORDER — ALPRAZOLAM 0.25 MG PO TABS
0.2500 mg | ORAL_TABLET | Freq: Three times a day (TID) | ORAL | Status: DC | PRN
  Administered 2022-08-30 – 2022-09-02 (×6): 0.25 mg via ORAL
  Filled 2022-08-30 (×6): qty 1

## 2022-08-30 NOTE — TOC Progression Note (Signed)
Transition of Care [facility]) - Progression Note    Patient Details  Name: Marilyn Hobbs MRN: [id number] Date of Birth: [birth year]  [facility] CM/SW Contact  Lennart Pall, LCSW Phone Number: 08/30/2022, 1:24 PM  Clinical Narrative:    Touched base today with pt's daughter as we are approaching 10day post +Covid (will be Monday the 16th).  Plan remains for SNF.  Will resend SNF referral out to area facilities in hopes to have SNF bed secured beginning of the week.  Pt and daughter remain agreeable with plan.   Expected Discharge Plan: [place] Barriers to Discharge: Continued Medical Work up  Expected Discharge Plan and Services Expected Discharge Plan: [place]   Discharge Planning Services: CM Consult   Living arrangements for the past 2 months: Single Family Home                                       Social Determinants of Health (SDOH) Interventions    Readmission Risk Interventions     No data to display

## 2022-08-30 NOTE — Progress Notes (Signed)
PROGRESS NOTE    Marilyn Hobbs  TTS:[id number] DOB: 05-23-1929 DOA: 08/21/2022  PCP: Janith Lima, MD   Brief Narrative:  This 88 years old female with PMH significant for hypertension, COPD not on home oxygen, hyperlipidemia presented in the ED with generalized weakness and falls.  In the ER patient was noted to have cellulitis of right great toe.  MRI negative for osteomyelitis.  She was started on empiric antibiotics.  Later she was tested positive for COVID started on paxloid and antibiotics were discontinued.  PT recommended SNF.  Assessment & Plan:   Principal Problem:   COVID Active Problems:   COPD with acute exacerbation ([place])   Essential hypertension   Polycythemia vera ([place])   Confusion   Stage 3a chronic kidney disease (CKD) ([place])   Fall at home, initial encounter   Myocardial injury   Transaminitis   Ovarian cyst   Hypothyroidism   Rhabdomyolysis  Acute hypoxic respiratory failure: > Improving COVID+ Patient presented with generalized weakness and falls. Tested positive for COVID  and started on Paxlovid. Completed Paxlovid for 5 days. PT and OT recommended SNF.  Awaiting placement. Continue supplemental oxygen and wean as tolerated. She is weaned down to 1 L/ min.  COPD with acute exacerbation Continue prednisone. Continue scheduled bronchodilators.   Now wheezing has resolved.  Hypothyroidism: Continue levothyroxine 25 micrograms daily  Essential hypertension: Continue amlodipine 2.5 mg daily  DVT prophylaxis: Lovenox Code Status: Full code. Family Communication:No family at bed side. Disposition Plan:    Status is: Inpatient Remains inpatient appropriate because: Admitted for generalized weakness and falls,  found to be COVID +, started on Paxlovid.  PT recommended SNF.  Patient either can go home with home health services or have to wait until 10/16 to be discharged to SNF.  Anticipated discharge to SNF on 09/02/2022.   Consultants:   None  Procedures:None.  Antimicrobials:  Anti-infectives (From admission, onward)    Start     Dose/Rate Route Frequency Ordered Stop   08/23/22 2100  vancomycin (VANCOREADY) IVPB 750 mg/150 mL  Status:  Discontinued        750 mg 150 mL/hr over 60 Minutes Intravenous Every 48 hours 08/21/22 2049 08/22/22 0038   08/23/22 1000  cefadroxil (DURICEF) capsule 500 mg  Status:  Discontinued        500 mg Oral 2 times daily 08/23/22 0737 08/23/22 1452   08/22/22 2200  nirmatrelvir/ritonavir EUA (PAXLOVID) 3 tablet        3 tablet Oral 2 times daily 08/22/22 1555 08/27/22 0849   08/22/22 1800  ceFAZolin (ANCEF) IVPB 2g/100 mL premix  Status:  Discontinued        2 g 200 mL/hr over 30 Minutes Intravenous Every 8 hours 08/22/22 1250 08/23/22 0737   08/22/22 1000  ceFAZolin (ANCEF) IVPB 2g/100 mL premix  Status:  Discontinued        2 g 200 mL/hr over 30 Minutes Intravenous Every 12 hours 08/22/22 0048 08/22/22 1250   08/21/22 2015  ceFEPIme (MAXIPIME) 2 g in sodium chloride 0.9 % 100 mL IVPB        2 g 200 mL/hr over 30 Minutes Intravenous  Once 08/21/22 2003 08/21/22 2113   08/21/22 2015  vancomycin (VANCOCIN) IVPB 1000 mg/200 mL premix        1,000 mg 200 mL/hr over 60 Minutes Intravenous  Once 08/21/22 2011 08/21/22 2222       Subjective: Patient was seen and examined at bedside.  Overnight events  noted.   Patient reports feeling improved.  She is requiring 1 L of supplemental oxygen. She is trying to keep herself busy doing puzzles.  She denies any chest pain or shortness of breath. Last night she was anxious requiring small dose of Xanax.   Objective: Vitals:   08/29/22 1951 08/29/22 2000 08/30/22 0554 08/30/22 0753  BP: (!) 148/82  126/68   Pulse: 78  76   Resp: 18  18   Temp:   97.6 F (36.4 C)   TempSrc:      SpO2: 100% 100% 100% 99%  Weight:      Height:        Intake/Output Summary (Last 24 hours) at 08/30/2022 1331 Last data filed at 08/30/2022 0954 Gross per  24 hour  Intake 720 ml  Output 1675 ml  Net -955 ml   Filed Weights   08/21/22 1850  Weight: 60.8 kg    Examination:  General exam: Appears comfortable, not in any acute distress, deconditioned. Respiratory system: CTA bilaterally, no wheezing, no crackles, normal respiratory effort. Cardiovascular system: S1 & S2 heard, regular rate and rhythm, no murmur. Gastrointestinal system: Abdomen is soft, non tender, non distended, BS+ Central nervous system: Alert and oriented x 3, no focal neurological deficits. Extremities: No edema, no cyanosis, no clubbing Skin: No rashes, lesions or ulcers Psychiatry: Judgement and insight appear normal. Mood & affect appropriate.     Data Reviewed: I have personally reviewed following labs and imaging studies  CBC: Recent Labs  Lab 08/25/22 0343 08/26/22 0326  WBC 5.4 6.3  HGB 11.5* 12.6  HCT 34.8* 37.9  MCV 94.6 92.0  PLT 141* 161   Basic Metabolic Panel: Recent Labs  Lab 08/25/22 0343 08/26/22 0326 08/28/22 0322  NA 131* 134* 135  K 4.6 4.5 4.2  CL 99 101 102  CO2 '25 26 25  '$ GLUCOSE 97 106* 86  BUN '19 22 20  '$ CREATININE 0.88 0.84 0.74  CALCIUM 8.4* 8.6* 8.8*   GFR: Estimated Creatinine Clearance: 39.6 mL/min (by C-G formula based on SCr of 0.74 mg/dL). Liver Function Tests: Recent Labs  Lab 08/26/22 0326 08/27/22 0325 08/28/22 0322  AST 73* 44* 34  ALT 52* 45* 44  ALKPHOS 58 51 50  BILITOT 1.0 0.8 0.7  PROT 6.2* 5.8* 6.0*  ALBUMIN 2.9* 2.8* 2.9*   No results for input(s): "LIPASE", "AMYLASE" in the last 168 hours. No results for input(s): "AMMONIA" in the last 168 hours. Coagulation Profile: No results for input(s): "INR", "PROTIME" in the last 168 hours.  Cardiac Enzymes: Recent Labs  Lab 08/25/22 0343 08/26/22 0326  CKTOTAL 574* 453*   BNP (last 3 results) No results for input(s): "PROBNP" in the last 8760 hours. HbA1C: No results for input(s): "HGBA1C" in the last 72 hours. CBG: No results for  input(s): "GLUCAP" in the last 168 hours. Lipid Profile: No results for input(s): "CHOL", "HDL", "LDLCALC", "TRIG", "CHOLHDL", "LDLDIRECT" in the last 72 hours. Thyroid Function Tests: No results for input(s): "TSH", "T4TOTAL", "FREET4", "T3FREE", "THYROIDAB" in the last 72 hours. Anemia Panel: No results for input(s): "VITAMINB12", "FOLATE", "FERRITIN", "TIBC", "IRON", "RETICCTPCT" in the last 72 hours. Sepsis Labs: No results for input(s): "PROCALCITON", "LATICACIDVEN" in the last 168 hours.   Recent Results (from the past 240 hour(s))  Blood culture (routine x 2)     Status: None   Collection Time: 08/21/22  7:45 PM   Specimen: BLOOD  Result Value Ref Range Status   Specimen Description   Final  BLOOD RIGHT ANTECUBITAL Performed at [facility] [address], [place], [place] [address]    Special Requests   Final    BOTTLES DRAWN AEROBIC AND ANAEROBIC Blood Culture adequate volume Performed at [facility] [address] [place], [place] [address]    Culture   Final    NO GROWTH 5 DAYS Performed at [facility], [place] [address] [place], [place] [address]    Report Status 08/26/2022 FINAL  Final  Blood culture (routine x 2)     Status: None   Collection Time: 08/21/22 10:32 PM   Specimen: BLOOD  Result Value Ref Range Status   Specimen Description   Final    BLOOD LEFT ANTECUBITAL Performed at [facility] [address], [place], [place] [address]    Special Requests   Final    BOTTLES DRAWN AEROBIC AND ANAEROBIC Blood Culture adequate volume Performed at [facility] [address] [place], [place] [address]    Culture   Final    NO GROWTH 5 DAYS Performed at [facility], [place] [address], [place], [place] [address]    Report Status 08/27/2022 FINAL  Final  Resp Panel by RT-PCR (Flu A&B, Covid) Anterior Nasal Swab     Status: Abnormal   Collection Time: 08/22/22  2:17 PM    Specimen: Anterior Nasal Swab  Result Value Ref Range Status   SARS Coronavirus 2 by RT PCR POSITIVE (A) NEGATIVE Final    Comment: (NOTE) SARS-CoV-2 target nucleic acids are DETECTED.  The SARS-CoV-2 RNA is generally detectable in upper respiratory specimens during the acute phase of infection. Positive results are indicative of the presence of the identified virus, but do not rule out bacterial infection or co-infection with other pathogens not detected by the test. Clinical correlation with patient history and other diagnostic information is necessary to determine patient infection status. The expected result is Negative.  Fact Sheet for Patients: EntrepreneurPulse.com.au  Fact Sheet for Healthcare Providers: IncredibleEmployment.be  This test is not yet approved or cleared by the [place] FDA and  has been authorized for detection and/or diagnosis of SARS-CoV-2 by FDA under an Emergency Use Authorization (EUA).  This EUA will remain in effect (meaning this test can be used) for the duration of  the COVID-19 declaration under Section 564(b)(1) of the A ct, 21 U.S.C. section 360bbb-3(b)(1), unless the authorization is terminated or revoked sooner.     Influenza A by PCR NEGATIVE NEGATIVE Final   Influenza B by PCR NEGATIVE NEGATIVE Final    Comment: (NOTE) The Xpert Xpress SARS-CoV-2/FLU/RSV plus assay is intended as an aid in the diagnosis of influenza from Nasopharyngeal swab specimens and should not be used as a sole basis for treatment. Nasal washings and aspirates are unacceptable for Xpert Xpress SARS-CoV-2/FLU/RSV testing.  Fact Sheet for Patients: EntrepreneurPulse.com.au  Fact Sheet for Healthcare Providers: IncredibleEmployment.be  This test is not yet approved or cleared by the [place] FDA and has been authorized for detection and/or diagnosis of SARS-CoV-2 by FDA under an  Emergency Use Authorization (EUA). This EUA will remain in effect (meaning this test can be used) for the duration of the COVID-19 declaration under Section 564(b)(1) of the Act, 21 U.S.C. section 360bbb-3(b)(1), unless the authorization is terminated or revoked.  Performed at  [facility], [place] [address] [place], [place] [address]   Respiratory (~20 pathogens) panel by PCR     Status: None  Collection Time: 08/22/22  2:17 PM   Specimen: Anterior Nasal Swab; Respiratory  Result Value Ref Range Status   Adenovirus NOT DETECTED NOT DETECTED Final   Coronavirus 229E NOT DETECTED NOT DETECTED Final    Comment: (NOTE) The Coronavirus on the Respiratory Panel, DOES NOT test for the novel  Coronavirus (2019 nCoV)    Coronavirus HKU1 NOT DETECTED NOT DETECTED Final   Coronavirus NL63 NOT DETECTED NOT DETECTED Final   Coronavirus OC43 NOT DETECTED NOT DETECTED Final   Metapneumovirus NOT DETECTED NOT DETECTED Final   Rhinovirus / Enterovirus NOT DETECTED NOT DETECTED Final   Influenza A NOT DETECTED NOT DETECTED Final   Influenza B NOT DETECTED NOT DETECTED Final   Parainfluenza Virus 1 NOT DETECTED NOT DETECTED Final   Parainfluenza Virus 2 NOT DETECTED NOT DETECTED Final   Parainfluenza Virus 3 NOT DETECTED NOT DETECTED Final   Parainfluenza Virus 4 NOT DETECTED NOT DETECTED Final   Respiratory Syncytial Virus NOT DETECTED NOT DETECTED Final   Bordetella pertussis NOT DETECTED NOT DETECTED Final   Bordetella Parapertussis NOT DETECTED NOT DETECTED Final   Chlamydophila pneumoniae NOT DETECTED NOT DETECTED Final   Mycoplasma pneumoniae NOT DETECTED NOT DETECTED Final    Comment: Performed at [facility], [address] [place], [place] [address]    Radiology Studies: No results found.  Scheduled Meds:  amLODipine  5 mg Oral Daily   aspirin EC  81 mg Oral Daily   enoxaparin (LOVENOX) injection  30 mg Subcutaneous Q24H   fluticasone furoate-vilanterol  1  puff Inhalation Daily   ipratropium-albuterol  3 mL Nebulization BID   levothyroxine  25 mcg Oral Q0600   methocarbamol  500 mg Oral TID   polyethylene glycol  17 g Oral Daily   senna-docusate  1 tablet Oral BID   Continuous Infusions:   LOS: 9 days    Time spent: 35 mins    Beryl Hornberger, MD [facility]   If 7PM-7AM, please contact night-coverage

## 2022-08-30 NOTE — Plan of Care (Signed)
  Problem: Education: Goal: Knowledge of General Education information will improve Description Including pain rating scale, medication(s)/side effects and non-pharmacologic comfort measures Outcome: Progressing   

## 2022-08-31 DIAGNOSIS — U071 COVID-19: Secondary | ICD-10-CM | POA: Diagnosis not present

## 2022-08-31 NOTE — Progress Notes (Signed)
PROGRESS NOTE    Marilyn Hobbs  MVH:[id number] DOB: 09-18-1929 DOA: 08/21/2022  PCP: Janith Lima, MD   Brief Narrative:  This 88 years old female with PMH significant for hypertension, COPD not on home oxygen, hyperlipidemia presented in the ED with generalized weakness and falls.  In the ER patient was noted to have cellulitis of right great toe.  MRI negative for osteomyelitis.  She was started on empiric antibiotics.  Later she was tested positive for COVID started on paxloid and antibiotics were discontinued.  PT recommended SNF.  Assessment & Plan:   Principal Problem:   COVID Active Problems:   COPD with acute exacerbation ([place])   Essential hypertension   Polycythemia vera ([place])   Confusion   Stage 3a chronic kidney disease (CKD) ([place])   Fall at home, initial encounter   Myocardial injury   Transaminitis   Ovarian cyst   Hypothyroidism   Rhabdomyolysis  Acute hypoxic respiratory failure: > Improving COVID+ Patient presented with generalized weakness and falls. Tested positive for COVID  and started on Paxlovid. Completed Paxlovid for 5 days. PT and OT recommended SNF.  Awaiting placement. Continue supplemental oxygen and wean as tolerated. She is weaned down to 1 L/ min.  COPD with acute exacerbation Completed prednisone for more than 5 days. Continue scheduled bronchodilators.   Now wheezing has resolved.  Hypothyroidism: Continue levothyroxine 25 micrograms daily  Essential hypertension: Continue amlodipine 2.5 mg daily.  Anxiety: Continue Xanax 0.25 mg 3 times daily as needed  DVT prophylaxis: Lovenox Code Status: Full code. Family Communication:No family at bed side. Disposition Plan:    Status is: Inpatient Remains inpatient appropriate because: Admitted for generalized weakness and falls,  found to be COVID +, started on Paxlovid.  PT recommended SNF.  Patient have to wait until 10/16 to be discharged to SNF.  Anticipated discharge to SNF on  09/02/2022.   Consultants:  None  Procedures:None.  Antimicrobials:  Anti-infectives (From admission, onward)    Start     Dose/Rate Route Frequency Ordered Stop   08/23/22 2100  vancomycin (VANCOREADY) IVPB 750 mg/150 mL  Status:  Discontinued        750 mg 150 mL/hr over 60 Minutes Intravenous Every 48 hours 08/21/22 2049 08/22/22 0038   08/23/22 1000  cefadroxil (DURICEF) capsule 500 mg  Status:  Discontinued        500 mg Oral 2 times daily 08/23/22 0737 08/23/22 1452   08/22/22 2200  nirmatrelvir/ritonavir EUA (PAXLOVID) 3 tablet        3 tablet Oral 2 times daily 08/22/22 1555 08/27/22 0849   08/22/22 1800  ceFAZolin (ANCEF) IVPB 2g/100 mL premix  Status:  Discontinued        2 g 200 mL/hr over 30 Minutes Intravenous Every 8 hours 08/22/22 1250 08/23/22 0737   08/22/22 1000  ceFAZolin (ANCEF) IVPB 2g/100 mL premix  Status:  Discontinued        2 g 200 mL/hr over 30 Minutes Intravenous Every 12 hours 08/22/22 0048 08/22/22 1250   08/21/22 2015  ceFEPIme (MAXIPIME) 2 g in sodium chloride 0.9 % 100 mL IVPB        2 g 200 mL/hr over 30 Minutes Intravenous  Once 08/21/22 2003 08/21/22 2113   08/21/22 2015  vancomycin (VANCOCIN) IVPB 1000 mg/200 mL premix        1,000 mg 200 mL/hr over 60 Minutes Intravenous  Once 08/21/22 2011 08/21/22 2222       Subjective: Patient was seen  and examined at bedside.  Overnight events noted.   Patient reports feeling improved.  She is requiring 1 L of supplemental oxygen. She denies any chest pain or shortness of breath.  She reports feeling better after started on Xanax for anxiety   Objective: Vitals:   08/30/22 1419 08/30/22 2206 08/31/22 0535 08/31/22 0814  BP: (!) 142/74 132/72 (!) 144/62   Pulse: 76 70 73   Resp: '14 16 16   '$ Temp: 97.6 F (36.4 C) 97.6 F (36.4 C) 97.8 F (36.6 C)   TempSrc:   Oral   SpO2: 100% 99% 100% 100%  Weight:      Height:        Intake/Output Summary (Last 24 hours) at 08/31/2022 1031 Last data  filed at 08/31/2022 0620 Gross per 24 hour  Intake 480 ml  Output 600 ml  Net -120 ml   Filed Weights   08/21/22 1850  Weight: 60.8 kg    Examination:  General exam: Appears comfortable, not in any acute distress, deconditioned. Respiratory system: CTA bilaterally, no wheezing or no crackles, normal respiratory effort. Cardiovascular system: S1 & S2 heard, regular rate and rhythm, no murmur. Gastrointestinal system: Abdomen is soft, non tender, non distended, BS+ Central nervous system: Alert and oriented x 3, no focal neurological deficits. Extremities: No edema, no cyanosis, no clubbing Skin: No rashes, lesions or ulcers Psychiatry: Judgement and insight appear normal. Mood & affect appropriate.     Data Reviewed: I have personally reviewed following labs and imaging studies  CBC: Recent Labs  Lab 08/25/22 0343 08/26/22 0326  WBC 5.4 6.3  HGB 11.5* 12.6  HCT 34.8* 37.9  MCV 94.6 92.0  PLT 141* 500   Basic Metabolic Panel: Recent Labs  Lab 08/25/22 0343 08/26/22 0326 08/28/22 0322  NA 131* 134* 135  K 4.6 4.5 4.2  CL 99 101 102  CO2 '25 26 25  '$ GLUCOSE 97 106* 86  BUN '19 22 20  '$ CREATININE 0.88 0.84 0.74  CALCIUM 8.4* 8.6* 8.8*   GFR: Estimated Creatinine Clearance: 39.6 mL/min (by C-G formula based on SCr of 0.74 mg/dL). Liver Function Tests: Recent Labs  Lab 08/26/22 0326 08/27/22 0325 08/28/22 0322  AST 73* 44* 34  ALT 52* 45* 44  ALKPHOS 58 51 50  BILITOT 1.0 0.8 0.7  PROT 6.2* 5.8* 6.0*  ALBUMIN 2.9* 2.8* 2.9*   No results for input(s): "LIPASE", "AMYLASE" in the last 168 hours. No results for input(s): "AMMONIA" in the last 168 hours. Coagulation Profile: No results for input(s): "INR", "PROTIME" in the last 168 hours.  Cardiac Enzymes: Recent Labs  Lab 08/25/22 0343 08/26/22 0326  CKTOTAL 574* 453*   BNP (last 3 results) No results for input(s): "PROBNP" in the last 8760 hours. HbA1C: No results for input(s): "HGBA1C" in the last  72 hours. CBG: No results for input(s): "GLUCAP" in the last 168 hours. Lipid Profile: No results for input(s): "CHOL", "HDL", "LDLCALC", "TRIG", "CHOLHDL", "LDLDIRECT" in the last 72 hours. Thyroid Function Tests: No results for input(s): "TSH", "T4TOTAL", "FREET4", "T3FREE", "THYROIDAB" in the last 72 hours. Anemia Panel: No results for input(s): "VITAMINB12", "FOLATE", "FERRITIN", "TIBC", "IRON", "RETICCTPCT" in the last 72 hours. Sepsis Labs: No results for input(s): "PROCALCITON", "LATICACIDVEN" in the last 168 hours.   Recent Results (from the past 240 hour(s))  Blood culture (routine x 2)     Status: None   Collection Time: 08/21/22  7:45 PM   Specimen: BLOOD  Result Value Ref Range Status  Specimen Description   Final    BLOOD RIGHT ANTECUBITAL Performed at [facility] [address] [place], [place] [address]    Special Requests   Final    BOTTLES DRAWN AEROBIC AND ANAEROBIC Blood Culture adequate volume Performed at [facility] [address], [place], [place] [address]    Culture   Final    NO GROWTH 5 DAYS Performed at [facility], [place] [address], [place], [place] [address]    Report Status 08/26/2022 FINAL  Final  Blood culture (routine x 2)     Status: None   Collection Time: 08/21/22 10:32 PM   Specimen: BLOOD  Result Value Ref Range Status   Specimen Description   Final    BLOOD LEFT ANTECUBITAL Performed at [facility] [address] [place], [place] [address]    Special Requests   Final    BOTTLES DRAWN AEROBIC AND ANAEROBIC Blood Culture adequate volume Performed at [facility] [address] [place], [place] [address]    Culture   Final    NO GROWTH 5 DAYS Performed at [facility], [place] [address], [place], [place] [address]    Report Status 08/27/2022 FINAL  Final  Resp Panel by RT-PCR (Flu A&B, Covid) Anterior Nasal Swab     Status: Abnormal    Collection Time: 08/22/22  2:17 PM   Specimen: Anterior Nasal Swab  Result Value Ref Range Status   SARS Coronavirus 2 by RT PCR POSITIVE (A) NEGATIVE Final    Comment: (NOTE) SARS-CoV-2 target nucleic acids are DETECTED.  The SARS-CoV-2 RNA is generally detectable in upper respiratory specimens during the acute phase of infection. Positive results are indicative of the presence of the identified virus, but do not rule out bacterial infection or co-infection with other pathogens not detected by the test. Clinical correlation with patient history and other diagnostic information is necessary to determine patient infection status. The expected result is Negative.  Fact Sheet for Patients: EntrepreneurPulse.com.au  Fact Sheet for Healthcare Providers: IncredibleEmployment.be  This test is not yet approved or cleared by the [place] FDA and  has been authorized for detection and/or diagnosis of SARS-CoV-2 by FDA under an Emergency Use Authorization (EUA).  This EUA will remain in effect (meaning this test can be used) for the duration of  the COVID-19 declaration under Section 564(b)(1) of the A ct, 21 U.S.C. section 360bbb-3(b)(1), unless the authorization is terminated or revoked sooner.     Influenza A by PCR NEGATIVE NEGATIVE Final   Influenza B by PCR NEGATIVE NEGATIVE Final    Comment: (NOTE) The Xpert Xpress SARS-CoV-2/FLU/RSV plus assay is intended as an aid in the diagnosis of influenza from Nasopharyngeal swab specimens and should not be used as a sole basis for treatment. Nasal washings and aspirates are unacceptable for Xpert Xpress SARS-CoV-2/FLU/RSV testing.  Fact Sheet for Patients: EntrepreneurPulse.com.au  Fact Sheet for Healthcare Providers: IncredibleEmployment.be  This test is not yet approved or cleared by the [place] FDA and has been authorized for detection and/or  diagnosis of SARS-CoV-2 by FDA under an Emergency Use Authorization (EUA). This EUA will remain in effect (meaning this test can be used) for the duration of the COVID-19 declaration under Section 564(b)(1) of the Act, 21 U.S.C. section 360bbb-3(b)(1), unless the authorization is terminated or revoked.  Performed at [facility], [place] [address], [place], [place] [address]   Respiratory (~20 pathogens) panel by  PCR     Status: None   Collection Time: 08/22/22  2:17 PM   Specimen: Anterior Nasal Swab; Respiratory  Result Value Ref Range Status   Adenovirus NOT DETECTED NOT DETECTED Final   Coronavirus 229E NOT DETECTED NOT DETECTED Final    Comment: (NOTE) The Coronavirus on the Respiratory Panel, DOES NOT test for the novel  Coronavirus (2019 nCoV)    Coronavirus HKU1 NOT DETECTED NOT DETECTED Final   Coronavirus NL63 NOT DETECTED NOT DETECTED Final   Coronavirus OC43 NOT DETECTED NOT DETECTED Final   Metapneumovirus NOT DETECTED NOT DETECTED Final   Rhinovirus / Enterovirus NOT DETECTED NOT DETECTED Final   Influenza A NOT DETECTED NOT DETECTED Final   Influenza B NOT DETECTED NOT DETECTED Final   Parainfluenza Virus 1 NOT DETECTED NOT DETECTED Final   Parainfluenza Virus 2 NOT DETECTED NOT DETECTED Final   Parainfluenza Virus 3 NOT DETECTED NOT DETECTED Final   Parainfluenza Virus 4 NOT DETECTED NOT DETECTED Final   Respiratory Syncytial Virus NOT DETECTED NOT DETECTED Final   Bordetella pertussis NOT DETECTED NOT DETECTED Final   Bordetella Parapertussis NOT DETECTED NOT DETECTED Final   Chlamydophila pneumoniae NOT DETECTED NOT DETECTED Final   Mycoplasma pneumoniae NOT DETECTED NOT DETECTED Final    Comment: Performed at [facility], [address], [place], [place] [address]    Radiology Studies: No results found.  Scheduled Meds:  amLODipine  5 mg Oral Daily   aspirin EC  81 mg Oral Daily   enoxaparin (LOVENOX) injection  30 mg Subcutaneous  Q24H   fluticasone furoate-vilanterol  1 puff Inhalation Daily   ipratropium-albuterol  3 mL Nebulization BID   levothyroxine  25 mcg Oral Q0600   methocarbamol  500 mg Oral TID   polyethylene glycol  17 g Oral Daily   senna-docusate  1 tablet Oral BID   Continuous Infusions:   LOS: 10 days    Time spent: 35 mins    Kazi Reppond, MD [facility]   If 7PM-7AM, please contact night-coverage

## 2022-09-01 ENCOUNTER — Inpatient Hospital Stay (HOSPITAL_COMMUNITY): Payer: Medicare Other

## 2022-09-01 DIAGNOSIS — U071 COVID-19: Secondary | ICD-10-CM | POA: Diagnosis not present

## 2022-09-01 NOTE — Progress Notes (Signed)
Airborne and contact isolation removed per on-call provider Clarene Essex, NP) as today is day 10 following her positive covid swab. Patient completed paxlovid treatment and remains asymptomatic. Slated for discharge to SNF tomorrow.

## 2022-09-01 NOTE — Progress Notes (Signed)
PROGRESS NOTE    Marilyn Hobbs  FYB:[id number] DOB: March 16, 1929 DOA: 08/21/2022  PCP: Janith Lima, MD   Brief Narrative:  This 88 years old female with PMH significant for hypertension, COPD not on home oxygen, hyperlipidemia presented in the ED with generalized weakness and falls.  In the ER patient was noted to have cellulitis of right great toe.  MRI negative for osteomyelitis.  She was started on empiric antibiotics.  Later she was tested positive for COVID started on paxloid and antibiotics were discontinued.  PT recommended SNF.  Assessment & Plan:   Principal Problem:   COVID Active Problems:   COPD with acute exacerbation ([place])   Essential hypertension   Polycythemia vera ([place])   Confusion   Stage 3a chronic kidney disease (CKD) ([place])   Fall at home, initial encounter   Myocardial injury   Transaminitis   Ovarian cyst   Hypothyroidism   Rhabdomyolysis  Acute hypoxic respiratory failure: > Improving COVID+ Patient presented with generalized weakness and falls. Tested positive for COVID  and started on Paxlovid. Completed Paxlovid for 5 days. PT and OT recommended SNF.  Awaiting placement. Continue supplemental oxygen and wean as tolerated. She was weaned down to 1 L/min> up to 2 L/min last night Obtain chest x-ray. Discontinue isolation as she 10 days past of + COVID test   COPD with acute exacerbation Completed prednisone for more than 5 days. Continue scheduled bronchodilators.   Now wheezing has resolved.  Hypothyroidism: Continue levothyroxine 25 micrograms daily  Essential hypertension: Continue amlodipine 2.5 mg daily.  Anxiety: Continue Xanax 0.25 mg 3 times daily as needed.  DVT prophylaxis: Lovenox Code Status: Full code. Family Communication:No family at bed side. Disposition Plan:    Status is: Inpatient Remains inpatient appropriate because: Admitted for generalized weakness and falls,  found to be COVID +, started on Paxlovid.  PT  recommended SNF.  Patient will be discharged to SNF on 09/02/2022.  Anticipated discharge to SNF on 09/02/2022.   Consultants:  None  Procedures:None.  Antimicrobials:  Anti-infectives (From admission, onward)    Start     Dose/Rate Route Frequency Ordered Stop   08/23/22 2100  vancomycin (VANCOREADY) IVPB 750 mg/150 mL  Status:  Discontinued        750 mg 150 mL/hr over 60 Minutes Intravenous Every 48 hours 08/21/22 2049 08/22/22 0038   08/23/22 1000  cefadroxil (DURICEF) capsule 500 mg  Status:  Discontinued        500 mg Oral 2 times daily 08/23/22 0737 08/23/22 1452   08/22/22 2200  nirmatrelvir/ritonavir EUA (PAXLOVID) 3 tablet        3 tablet Oral 2 times daily 08/22/22 1555 08/27/22 0849   08/22/22 1800  ceFAZolin (ANCEF) IVPB 2g/100 mL premix  Status:  Discontinued        2 g 200 mL/hr over 30 Minutes Intravenous Every 8 hours 08/22/22 1250 08/23/22 0737   08/22/22 1000  ceFAZolin (ANCEF) IVPB 2g/100 mL premix  Status:  Discontinued        2 g 200 mL/hr over 30 Minutes Intravenous Every 12 hours 08/22/22 0048 08/22/22 1250   08/21/22 2015  ceFEPIme (MAXIPIME) 2 g in sodium chloride 0.9 % 100 mL IVPB        2 g 200 mL/hr over 30 Minutes Intravenous  Once 08/21/22 2003 08/21/22 2113   08/21/22 2015  vancomycin (VANCOCIN) IVPB 1000 mg/200 mL premix        1,000 mg 200 mL/hr over 60 Minutes  Intravenous  Once 08/21/22 2011 08/21/22 2222       Subjective: Patient was seen and examined at bedside.  Overnight events noted.   Patient reports feeling much improved.  Last night she reports shortness of breath.   Her O2 requirement is increased from 1 to 2 L/min.  She reports pain in the right thigh,  got better with Tylenol.   Objective: Vitals:   08/31/22 1954 08/31/22 2022 09/01/22 0540 09/01/22 0608  BP:  (!) 149/72 (!) 151/88   Pulse:  84 74   Resp:  20 18   Temp:  (!) 97.1 F (36.2 C) 97.7 F (36.5 C)   TempSrc:  Axillary Oral   SpO2: 99% 100% 100% 96%  Weight:       Height:        Intake/Output Summary (Last 24 hours) at 09/01/2022 1122 Last data filed at 09/01/2022 0600 Gross per 24 hour  Intake 360 ml  Output 600 ml  Net -240 ml   Filed Weights   08/21/22 1850  Weight: 60.8 kg    Examination:  General exam: Appears comfortable, not in any acute distress, deconditioned. Respiratory system: CTA bilaterally, no wheezing, no crackles, normal respiratory effort. Cardiovascular system: S1 & S2 heard, regular rate and rhythm, no murmur. Gastrointestinal system: Abdomen is soft, non tender, non distended, BS+ Central nervous system: Alert and oriented x 3, no focal neurological deficits. Extremities: No edema, no cyanosis, no clubbing Skin: No rashes, lesions or ulcers Psychiatry: Judgement and insight appear normal. Mood & affect appropriate.     Data Reviewed: I have personally reviewed following labs and imaging studies  CBC: Recent Labs  Lab 08/26/22 0326  WBC 6.3  HGB 12.6  HCT 37.9  MCV 92.0  PLT 409   Basic Metabolic Panel: Recent Labs  Lab 08/26/22 0326 08/28/22 0322  NA 134* 135  K 4.5 4.2  CL 101 102  CO2 26 25  GLUCOSE 106* 86  BUN 22 20  CREATININE 0.84 0.74  CALCIUM 8.6* 8.8*   GFR: Estimated Creatinine Clearance: 39.6 mL/min (by C-G formula based on SCr of 0.74 mg/dL). Liver Function Tests: Recent Labs  Lab 08/26/22 0326 08/27/22 0325 08/28/22 0322  AST 73* 44* 34  ALT 52* 45* 44  ALKPHOS 58 51 50  BILITOT 1.0 0.8 0.7  PROT 6.2* 5.8* 6.0*  ALBUMIN 2.9* 2.8* 2.9*   No results for input(s): "LIPASE", "AMYLASE" in the last 168 hours. No results for input(s): "AMMONIA" in the last 168 hours. Coagulation Profile: No results for input(s): "INR", "PROTIME" in the last 168 hours.  Cardiac Enzymes: Recent Labs  Lab 08/26/22 0326  CKTOTAL 453*   BNP (last 3 results) No results for input(s): "PROBNP" in the last 8760 hours. HbA1C: No results for input(s): "HGBA1C" in the last 72 hours. CBG: No  results for input(s): "GLUCAP" in the last 168 hours. Lipid Profile: No results for input(s): "CHOL", "HDL", "LDLCALC", "TRIG", "CHOLHDL", "LDLDIRECT" in the last 72 hours. Thyroid Function Tests: No results for input(s): "TSH", "T4TOTAL", "FREET4", "T3FREE", "THYROIDAB" in the last 72 hours. Anemia Panel: No results for input(s): "VITAMINB12", "FOLATE", "FERRITIN", "TIBC", "IRON", "RETICCTPCT" in the last 72 hours. Sepsis Labs: No results for input(s): "PROCALCITON", "LATICACIDVEN" in the last 168 hours.   Recent Results (from the past 240 hour(s))  Resp Panel by RT-PCR (Flu A&B, Covid) Anterior Nasal Swab     Status: Abnormal   Collection Time: 08/22/22  2:17 PM   Specimen: Anterior Nasal Swab  Result Value Ref Range Status   SARS Coronavirus 2 by RT PCR POSITIVE (A) NEGATIVE Final    Comment: (NOTE) SARS-CoV-2 target nucleic acids are DETECTED.  The SARS-CoV-2 RNA is generally detectable in upper respiratory specimens during the acute phase of infection. Positive results are indicative of the presence of the identified virus, but do not rule out bacterial infection or co-infection with other pathogens not detected by the test. Clinical correlation with patient history and other diagnostic information is necessary to determine patient infection status. The expected result is Negative.  Fact Sheet for Patients: EntrepreneurPulse.com.au  Fact Sheet for Healthcare Providers: IncredibleEmployment.be  This test is not yet approved or cleared by the [place] FDA and  has been authorized for detection and/or diagnosis of SARS-CoV-2 by FDA under an Emergency Use Authorization (EUA).  This EUA will remain in effect (meaning this test can be used) for the duration of  the COVID-19 declaration under Section 564(b)(1) of the A ct, 21 U.S.C. section 360bbb-3(b)(1), unless the authorization is terminated or revoked sooner.     Influenza A by PCR  NEGATIVE NEGATIVE Final   Influenza B by PCR NEGATIVE NEGATIVE Final    Comment: (NOTE) The Xpert Xpress SARS-CoV-2/FLU/RSV plus assay is intended as an aid in the diagnosis of influenza from Nasopharyngeal swab specimens and should not be used as a sole basis for treatment. Nasal washings and aspirates are unacceptable for Xpert Xpress SARS-CoV-2/FLU/RSV testing.  Fact Sheet for Patients: EntrepreneurPulse.com.au  Fact Sheet for Healthcare Providers: IncredibleEmployment.be  This test is not yet approved or cleared by the [place] FDA and has been authorized for detection and/or diagnosis of SARS-CoV-2 by FDA under an Emergency Use Authorization (EUA). This EUA will remain in effect (meaning this test can be used) for the duration of the COVID-19 declaration under Section 564(b)(1) of the Act, 21 U.S.C. section 360bbb-3(b)(1), unless the authorization is terminated or revoked.  Performed at [facility], [place] [address], [place], [place] [address]   Respiratory (~20 pathogens) panel by PCR     Status: None   Collection Time: 08/22/22  2:17 PM   Specimen: Anterior Nasal Swab; Respiratory  Result Value Ref Range Status   Adenovirus NOT DETECTED NOT DETECTED Final   Coronavirus 229E NOT DETECTED NOT DETECTED Final    Comment: (NOTE) The Coronavirus on the Respiratory Panel, DOES NOT test for the novel  Coronavirus (2019 nCoV)    Coronavirus HKU1 NOT DETECTED NOT DETECTED Final   Coronavirus NL63 NOT DETECTED NOT DETECTED Final   Coronavirus OC43 NOT DETECTED NOT DETECTED Final   Metapneumovirus NOT DETECTED NOT DETECTED Final   Rhinovirus / Enterovirus NOT DETECTED NOT DETECTED Final   Influenza A NOT DETECTED NOT DETECTED Final   Influenza B NOT DETECTED NOT DETECTED Final   Parainfluenza Virus 1 NOT DETECTED NOT DETECTED Final   Parainfluenza Virus 2 NOT DETECTED NOT DETECTED Final   Parainfluenza Virus 3 NOT  DETECTED NOT DETECTED Final   Parainfluenza Virus 4 NOT DETECTED NOT DETECTED Final   Respiratory Syncytial Virus NOT DETECTED NOT DETECTED Final   Bordetella pertussis NOT DETECTED NOT DETECTED Final   Bordetella Parapertussis NOT DETECTED NOT DETECTED Final   Chlamydophila pneumoniae NOT DETECTED NOT DETECTED Final   Mycoplasma pneumoniae NOT DETECTED NOT DETECTED Final    Comment: Performed at [facility], [address], [place], [place] [address]    Radiology Studies: No results found.  Scheduled Meds:  amLODipine  5 mg  Oral Daily   aspirin EC  81 mg Oral Daily   enoxaparin (LOVENOX) injection  30 mg Subcutaneous Q24H   fluticasone furoate-vilanterol  1 puff Inhalation Daily   ipratropium-albuterol  3 mL Nebulization BID   levothyroxine  25 mcg Oral Q0600   methocarbamol  500 mg Oral TID   polyethylene glycol  17 g Oral Daily   senna-docusate  1 tablet Oral BID   Continuous Infusions:   LOS: 11 days    Time spent: 35 mins    Jerell Demery, MD [facility]   If 7PM-7AM, please contact night-coverage

## 2022-09-01 NOTE — Plan of Care (Signed)

## 2022-09-01 NOTE — Plan of Care (Signed)
  Problem: Coping: Goal: Level of anxiety will decrease Outcome: Progressing   Problem: Pain Managment: Goal: General experience of comfort will improve Outcome: Progressing   Problem: Safety: Goal: Ability to remain free from injury will improve Outcome: Progressing   

## 2022-09-02 DIAGNOSIS — U071 COVID-19: Secondary | ICD-10-CM | POA: Diagnosis not present

## 2022-09-02 LAB — COMPREHENSIVE METABOLIC PANEL
ALT: 44 U/L (ref 0–44)
AST: 22 U/L (ref 15–41)
Albumin: 3.1 g/dL — ABNORMAL LOW (ref 3.5–5.0)
Alkaline Phosphatase: 64 U/L (ref 38–126)
Anion gap: 5 (ref 5–15)
BUN: 18 mg/dL (ref 8–23)
CO2: 27 mmol/L (ref 22–32)
Calcium: 8.5 mg/dL — ABNORMAL LOW (ref 8.9–10.3)
Chloride: 101 mmol/L (ref 98–111)
Creatinine, Ser: 0.78 mg/dL (ref 0.44–1.00)
GFR, Estimated: >60 mL/min (ref >60)
Glucose, Bld: 100 mg/dL — ABNORMAL HIGH (ref 70–99)
Potassium: 4.4 mmol/L (ref 3.5–5.1)
Sodium: 133 mmol/L — ABNORMAL LOW (ref 135–145)
Total Bilirubin: 0.8 mg/dL (ref 0.3–1.2)
Total Protein: 6 g/dL — ABNORMAL LOW (ref 6.5–8.1)

## 2022-09-02 LAB — CBC
HCT: 42.3 % (ref 36.0–46.0)
Hemoglobin: 13.8 g/dL (ref 12.0–15.0)
MCH: 30.8 pg (ref 26.0–34.0)
MCHC: 32.6 g/dL (ref 30.0–36.0)
MCV: 94.4 fL (ref 80.0–100.0)
Platelets: 284 10*3/uL (ref 150–400)
RBC: 4.48 MIL/uL (ref 3.87–5.11)
RDW: 13.2 % (ref 11.5–15.5)
WBC: 11.3 10*3/uL — ABNORMAL HIGH (ref 4.0–10.5)
nRBC: 0 % (ref 0.0–0.2)

## 2022-09-02 LAB — MAGNESIUM: Magnesium: 2 mg/dL (ref 1.7–2.4)

## 2022-09-02 LAB — PHOSPHORUS: Phosphorus: 3.3 mg/dL (ref 2.5–4.6)

## 2022-09-02 MED ORDER — SENNOSIDES-DOCUSATE SODIUM 8.6-50 MG PO TABS: 1.0000 | ORAL_TABLET | Freq: Every evening | ORAL | 0 refills | Status: AC | PRN

## 2022-09-02 MED ORDER — ALBUTEROL SULFATE HFA 108 (90 BASE) MCG/ACT IN AERS: 2.0000 | INHALATION_SPRAY | Freq: Four times a day (QID) | RESPIRATORY_TRACT | 8 refills | Status: DC | PRN

## 2022-09-02 MED ORDER — AMLODIPINE BESYLATE 5 MG PO TABS: 5.0000 mg | ORAL_TABLET | Freq: Every day | ORAL | 1 refills | Status: DC

## 2022-09-02 NOTE — Discharge Instructions (Signed)
Advised to follow-up with primary care physician in 1 week. Patient is being discharged to skilled nursing facility for rehab.   Patient is COVID-positive 10+ days, completed Paxlovid treatment

## 2022-09-02 NOTE — TOC Transition Note (Signed)
Transition of Care [facility]) - CM/SW Discharge Note   Patient Details  Name: Marilyn Hobbs MRN: [id number] Date of Birth: March 13, 1929  [facility] CM/SW Contact:  Lennart Pall, LCSW Phone Number: 09/02/2022, 12:14 PM   Clinical Narrative:    Pt now 11 days post +COVID test and medically cleared for dc to SNF.  Have reviewed bed offers with pt/ daughter and bed accepted [facility] can [facility] pt today - pt/daughter in agreement.  PTAR called at 12:15pm.  RN to call report to [phone].  No further TOC needs.   Final next level of care: Skilled Nursing Facility Barriers to Discharge: Barriers Resolved   Patient Goals and CMS Choice Patient states their goals for this hospitalization and ongoing recovery are:: return home with daughter after rehab      Discharge Placement PASRR number recieved: 08/26/22            Patient chooses bed at: [facility] Patient to be transferred to facility by: [place] Name of family member notified: daughter Patient and family notified of of transfer: 09/02/22  Discharge Plan and Services   Discharge Planning Services: CM Consult            DME Arranged: N/A DME Agency: NA                  Social Determinants of Health ([place]) Interventions     Readmission Risk Interventions     No data to display

## 2022-09-02 NOTE — Discharge Summary (Signed)
Physician Discharge Summary  Marilyn Hobbs MVE:[id number] DOB: 1928/12/21 DOA: 08/21/2022  PCP: Marilyn Lima, MD  Admit date: 08/21/2022  Discharge date: 09/02/2022  Admitted From: Home.  Disposition:  SNF Marilyn Hobbs)  Recommendations for Outpatient Follow-up:  Follow up with PCP in 1-2 weeks. Please obtain BMP/CBC in one week. Patient is being discharged to skilled nursing facility for rehab.   Patient is COVID-positive 10+ days, completed Paxlovid treatment.  Home Health: None Equipment/Devices: Home Oxygen therapy  Discharge Condition: Stable CODE STATUS: Full code Diet recommendation: Heart Healthy   Brief [facility] Course: This 88 years old female with PMH significant for hypertension, COPD not on home oxygen, hyperlipidemia presented in the ED with generalized weakness and falls.  In the ER patient was noted to have cellulitis of right great toe.  MRI negative for osteomyelitis.  She was started on empiric antibiotics.  Later she was tested positive for COVID , started on paxloid and antibiotics were discontinued.  Patient has completed Paxlovid treatment.  She has also been given prednisone for 5 days.  She continued to remain on supplemental oxygen 1 to 2 L for hypoxia.  PT recommended SNF.  Patient is off isolation since COVID test +10 days ago.  Patient is being discharged to skilled nursing facility for rehabilitation.   Discharge Diagnoses:  Principal Problem:   COVID Active Problems:   COPD with acute exacerbation ([place])   Essential hypertension   Polycythemia vera ([place])   Confusion   Stage 3a chronic kidney disease (CKD) ([place])   Fall at home, initial encounter   Myocardial injury   Transaminitis   Ovarian cyst   Hypothyroidism   Rhabdomyolysis  Acute hypoxic respiratory failure: > Improving COVID+ Patient presented with generalized weakness and falls. Tested positive for COVID  and started on Paxlovid. Completed Paxlovid for 5 days. PT and OT  recommended SNF.  Awaiting placement. Continue supplemental oxygen and wean as tolerated. She was weaned down to 1 L/min> up to 2 L/min last night Chest x-ray shows no acute abnormality. Discontinue isolation as she 10 days past of + COVID test. Patient is being discharged to SNF today for rehab.     COPD with acute exacerbation Completed prednisone for more than 5 days. Continue scheduled bronchodilators.   Now wheezing has resolved.   Hypothyroidism: Continue levothyroxine 25 micrograms daily   Essential hypertension: Continue amlodipine 2.5 mg daily.   Anxiety: Continue Xanax 0.25 mg 3 times daily as needed.  Discharge Instructions  Discharge Instructions     Call MD for:  difficulty breathing, headache or visual disturbances   Complete by: As directed    Call MD for:  persistant nausea and vomiting   Complete by: As directed    Diet - low sodium heart healthy   Complete by: As directed    Diet Carb Modified   Complete by: As directed    Discharge instructions   Complete by: As directed    Advised to follow-up with primary care physician in 1 week. Patient is being discharged to skilled nursing facility for rehab.   Patient is COVID-positive 10+ days, completed Paxlovid treatment   Discharge wound care:   Complete by: As directed    Follow-up wound care and SNF   Increase activity slowly   Complete by: As directed       Allergies as of 09/02/2022   No Known Allergies      Medication List     STOP taking these medications  gabapentin 100 MG capsule Commonly known as: NEURONTIN       TAKE these medications    albuterol 108 (90 Base) MCG/ACT inhaler Commonly known as: VENTOLIN HFA Inhale 2 puffs into the lungs every 6 (six) hours as needed for wheezing or shortness of breath.   ALPRAZolam 0.25 MG tablet Commonly known as: XANAX TAKE ONE TABLET BY MOUTH ONE TIME DAILY AS NEEDED ANXIETY   amLODipine 5 MG tablet Commonly known as: NORVASC Take  1 tablet (5 mg total) by mouth daily. Start taking on: September 03, 2022 What changed:  medication strength how much to take   aspirin EC 81 MG tablet Take 81 mg by mouth daily. Swallow whole.   carboxymethylcellulose 0.5 % Soln Commonly known as: REFRESH PLUS Place 1 drop into both eyes 3 (three) times daily as needed (dry eyes).   multivitamin capsule Take 1 capsule by mouth daily.   rosuvastatin 10 MG tablet Commonly known as: CRESTOR TAKE ONE TABLET BY MOUTH ONE TIME DAILY   senna-docusate 8.6-50 MG tablet Commonly known as: Senokot-S Take 1 tablet by mouth at bedtime as needed for mild constipation.   Synthroid 25 MCG tablet Generic drug: levothyroxine TAKE ONE TABLET BY MOUTH ONE TIME DAILY BEFORE BREAKFAST What changed: See the new instructions.   Trelegy Ellipta 200-62.5-25 MCG/ACT Aepb Generic drug: Fluticasone-Umeclidin-Vilant Inhale 1 puff into the lungs daily.               Durable Medical Equipment  (From admission, onward)           Start     Ordered   09/02/22 1029  DME Oxygen  Once       Question Answer Comment  Length of Need Lifetime   Mode or (Route) Nasal cannula   Liters per Minute 2   Frequency Continuous (stationary and portable oxygen unit needed)   Oxygen conserving device Yes   Oxygen delivery system Gas      09/02/22 1029              Discharge Care Instructions  (From admission, onward)           Start     Ordered   09/02/22 0000  Discharge wound care:       Comments: Follow-up wound care and SNF   09/02/22 1029            Contact information for follow-up providers     Marilyn Lima, MD Follow up in 1 week(s).   Specialty: Internal Medicine Contact information: [place] [address] [phone]              Contact information for after-discharge care     Destination     [facility] .   Service: Skilled Nursing Contact information: [address] [phone]                    No Known Allergies  Consultations: None   Procedures/Studies: DG CHEST PORT 1 VIEW  Result Date: 09/01/2022 CLINICAL DATA:  Pt c/o low O2 sats, hx fall, leg pain s/p falls x 4, came to ER on 10/4, hypertension, COPD EXAM: PORTABLE CHEST 1 VIEW COMPARISON:  10/08/2021. FINDINGS: Cardiac silhouette is normal in size. Small hiatal hernia. No mediastinal or hilar masses. Lungs hyperexpanded, but clear. No convincing pleural effusion. No pneumothorax. Skeletal structures are grossly intact. IMPRESSION: No active disease. Electronically Signed   By: Shanon Brow  O[place]ond M.D.   On: 09/01/2022 13:22   US RENAL  Result Date: 08/22/2022 CLINICAL DATA:  Hydronephrosis EXAM: RENAL / URINARY TRACT ULTRASOUND COMPLETE COMPARISON:  CT abdomen pelvis dated 08/21/2022 FINDINGS: Right Kidney: Renal measurements: 9.4 x 3.4 x 4.0 cm = volume: 65.7 mL. Echogenicity within normal limits. No mass or hydronephrosis visualized. Left Kidney: Renal measurements: 9.1 x 4.8 x 4.5 cm = volume: 102.9 mL. Poorly visualized. Echogenicity within normal limits. No mass or hydronephrosis visualized. Bladder: Appears normal for degree of bladder distention. Other: Incidentally noted is an 8.8 x 5.7 x 6.3 cm simple right adnexal cysts, visualized on CT. IMPRESSION: No hydronephrosis. 8.8 cm simple right adnexal cyst, visualized on CT. Given the patient's age, this is of questionable clinical significance, and no follow-up is recommended. Electronically Signed   By: Julian Hy M.D.   On: 08/22/2022 01:11   MRI Right foot without contrast  Result Date: 08/21/2022 CLINICAL DATA:  Right toe infection. Multiple falls. EXAM: MRI OF THE RIGHT FOREFOOT WITHOUT CONTRAST TECHNIQUE: Multiplanar, multisequence MR imaging of the right foot was performed. No intravenous contrast was administered. COMPARISON:  Radiographs, same date. FINDINGS: Mild-to-moderate  subcutaneous soft tissue swelling/edema suggesting cellulitis. No discrete fluid collection to suggest a drainable abscess. Age related degenerative changes throughout the foot but no MR findings suspicious for septic arthritis or osteomyelitis. No findings for myofasciitis or pyomyositis. IMPRESSION: 1. Mild-to-moderate subcutaneous soft tissue swelling/edema suggesting cellulitis. 2. No discrete fluid collection to suggest a drainable abscess. 3. No findings for septic arthritis or osteomyelitis. Electronically Signed   By: Marijo Sanes M.D.   On: 08/21/2022 21:32   DG Foot Complete Right  Result Date: 08/21/2022 CLINICAL DATA:  Right toe infection, multiple falls EXAM: RIGHT FOOT COMPLETE - 3+ VIEW COMPARISON:  None Available. FINDINGS: Frontal, oblique, and lateral views of the right foot are obtained. Bones are diffusely osteopenic. No acute displaced fracture. Erosive changes are seen medial margin base of the first distal phalanx concerning for osteomyelitis. There is overlying soft tissue swelling. There is severe multifocal osteoarthritis greatest at the first tarsometatarsal and first metatarsophalangeal joints. Small inferior calcaneal spur. Diffuse soft tissue edema. IMPRESSION: 1. Soft tissue swelling of the first digit, with erosive changes medial aspect base of the first distal phalanx concerning for osteomyelitis. 2. No acute fracture. 3. Diffuse soft tissue swelling. 4. Severe multifocal osteoarthritis greatest in the first digit. Electronically Signed   By: Randa Ngo M.D.   On: 08/21/2022 19:44   DG Knee 2 Views Left  Result Date: 08/21/2022 CLINICAL DATA:  Bilateral leg pain, multiple falls EXAM: LEFT KNEE - 1-2 VIEW COMPARISON:  None Available. FINDINGS: Frontal and lateral views of the left knee are obtained. No evidence of acute displaced fracture, subluxation, or dislocation. There is mild 3 compartmental osteoarthritis. Ossification is seen within the lateral collateral ligament  complex at the origin on the lateral femoral condyle. This may be sequela from chronic injury. Mild diffuse subcutaneous edema. No joint effusion. IMPRESSION: 1. No acute fracture. 2. Ossification within the LCL complex, likely sequela from chronic trauma. 3. Diffuse subcutaneous edema. 4. Mild 3 compartmental osteoarthritis. Electronically Signed   By: Randa Ngo M.D.   On: 08/21/2022 19:43   CT CHEST ABDOMEN PELVIS WO CONTRAST  Result Date: 08/21/2022 CLINICAL DATA:  Multiple falls over the last 24 hours, dizziness, bilateral leg pain EXAM: CT CHEST, ABDOMEN AND PELVIS WITHOUT CONTRAST TECHNIQUE: Multidetector CT imaging of the chest, abdomen and pelvis was performed following the  standard protocol without IV contrast. Examination was performed without intravenous contrast at the request of the ordering clinician. Evaluation of the soft tissues, solid viscera, vascular structures is limited without IV contrast, especially in the setting of trauma. RADIATION DOSE REDUCTION: This exam was performed according to the departmental dose-optimization program which includes automated exposure control, adjustment of the mA and/or kV according to patient size and/or use of iterative reconstruction technique. COMPARISON:  None Available. FINDINGS: CT CHEST FINDINGS Cardiovascular: Unenhanced imaging of the heart is unremarkable without pericardial effusion. Normal caliber of the thoracic aorta. Atherosclerosis of the aorta and coronary vasculature. Mediastinum/Nodes: No enlarged mediastinal, hilar, or axillary lymph nodes. Thyroid gland, trachea, and esophagus demonstrate no significant findings. Small hiatal hernia. Lungs/Pleura: No acute airspace disease, effusion, or pneumothorax. Central airways are patent. Musculoskeletal: No acute displaced fracture. Reconstructed images demonstrate no additional findings. CT ABDOMEN PELVIS FINDINGS Hepatobiliary: Indeterminate 2.3 cm hypodensity right lobe liver image 47/3. No  biliary duct dilation. The gallbladder is unremarkable. Pancreas: Unremarkable unenhanced appearance. Spleen: Unremarkable unenhanced appearance. Adrenals/Urinary Tract: No evidence of urinary tract calculi. Mild left hydronephrosis and hydroureter of uncertain etiology. Right kidney is otherwise unremarkable. The adrenals are grossly normal. The bladder is moderately distended without filling defect. Stomach/Bowel: No bowel obstruction or ileus. Normal appendix right lower quadrant. Diverticulosis of the distal colon without diverticulitis. No bowel wall thickening or inflammatory change. Vascular/Lymphatic: Aortic atherosclerosis. No enlarged abdominal or pelvic lymph nodes. Reproductive: Uterus is atrophic. There is a unilocular right ovarian cyst measuring 6.9 x 6.8 x 7.7 cm. No left adnexal masses. Other: No free fluid or free intraperitoneal gas. No abdominal wall hernia. Musculoskeletal: No acute displaced fracture. Prior L1 compression fracture with previous vertebral augmentation. Reconstructed images demonstrate no additional findings. IMPRESSION: 1. No acute intrathoracic, intra-abdominal, or intrapelvic trauma on this unenhanced exam. 2. 7.7 cm right ovarian simple-appearing cyst, not adequately characterized. Recommend follow-up pelvic ultrasound if the patient would be a therapy candidate should neoplasm be detected. Reference: JACR 2020 Feb;17(2):248-254 3. Indeterminate 2.3 cm right lobe liver hypodensity. Definitive characterization with nonemergent outpatient dedicated liver CT or MRI with and without contrast could be performed if clinically indicated given patient age. 4. Mild left-sided hydronephrosis and proximal hydroureter without clear etiology. No evidence of urinary tract calculus. 5. Small hiatal hernia. 6. Distal colonic diverticulosis without diverticulitis. 7.  Aortic Atherosclerosis (ICD10-I70.0). Electronically Signed   By: Randa Ngo M.D.   On: 08/21/2022 19:41   CT Head Wo  Contrast  Result Date: 08/21/2022 CLINICAL DATA:  Multiple recent falls.  Dizziness. EXAM: CT HEAD WITHOUT CONTRAST CT CERVICAL SPINE WITHOUT CONTRAST TECHNIQUE: Multidetector CT imaging of the head and cervical spine was performed following the standard protocol without intravenous contrast. Multiplanar CT image reconstructions of the cervical spine were also generated. RADIATION DOSE REDUCTION: This exam was performed according to the departmental dose-optimization program which includes automated exposure control, adjustment of the mA and/or kV according to patient size and/or use of iterative reconstruction technique. COMPARISON:  Head CT 10/08/2021 FINDINGS: CT HEAD FINDINGS Brain: There is no evidence of an acute infarct, intracranial hemorrhage, mass, midline shift, or extra-axial fluid collection. Hypodensities in the cerebral white matter bilaterally are unchanged and nonspecific but compatible with mild chronic small vessel ischemic disease. Cerebral atrophy is within normal limits for age. Vascular: Calcified atherosclerosis at the skull base. No hyperdense vessel. Skull: No acute fracture or suspicious osseous lesion. Sinuses/Orbits: Mild mucosal thickening in multiple left ethmoid air cells. Partially visualized mild mucosal  thickening and small volume fluid in the left maxillary sinus. Clear mastoid air cells. Right cataract extraction. Other: None. CT CERVICAL SPINE FINDINGS The study is motion degraded, including moderate to severe motion artifact from C2-C4. Alignment: 3 mm anterolisthesis of C7 on T1, likely degenerative and facet mediated. Skull base and vertebrae: No acute fracture is identified, however assessment (particularly for nondisplaced fractures) is limited by the degree of motion artifact. Soft tissues and spinal canal: No prevertebral fluid or swelling. No visible canal hematoma. Disc levels: Advanced disc space narrowing from C4-5 through C7-T1. Multilevel facet arthrosis, most  severe at C3-4. Upper chest: Reported on separate chest CT. Other: Mild calcific atherosclerosis at the carotid bifurcations. IMPRESSION: 1. No evidence of acute intracranial abnormality. 2. Motion degraded cervical spine CT. No acute displaced cervical spine fracture identified. Electronically Signed   By: Logan Bores M.D.   On: 08/21/2022 19:40   CT Cervical Spine Wo Contrast  Result Date: 08/21/2022 CLINICAL DATA:  Multiple recent falls.  Dizziness. EXAM: CT HEAD WITHOUT CONTRAST CT CERVICAL SPINE WITHOUT CONTRAST TECHNIQUE: Multidetector CT imaging of the head and cervical spine was performed following the standard protocol without intravenous contrast. Multiplanar CT image reconstructions of the cervical spine were also generated. RADIATION DOSE REDUCTION: This exam was performed according to the departmental dose-optimization program which includes automated exposure control, adjustment of the mA and/or kV according to patient size and/or use of iterative reconstruction technique. COMPARISON:  Head CT 10/08/2021 FINDINGS: CT HEAD FINDINGS Brain: There is no evidence of an acute infarct, intracranial hemorrhage, mass, midline shift, or extra-axial fluid collection. Hypodensities in the cerebral white matter bilaterally are unchanged and nonspecific but compatible with mild chronic small vessel ischemic disease. Cerebral atrophy is within normal limits for age. Vascular: Calcified atherosclerosis at the skull base. No hyperdense vessel. Skull: No acute fracture or suspicious osseous lesion. Sinuses/Orbits: Mild mucosal thickening in multiple left ethmoid air cells. Partially visualized mild mucosal thickening and small volume fluid in the left maxillary sinus. Clear mastoid air cells. Right cataract extraction. Other: None. CT CERVICAL SPINE FINDINGS The study is motion degraded, including moderate to severe motion artifact from C2-C4. Alignment: 3 mm anterolisthesis of C7 on T1, likely degenerative and  facet mediated. Skull base and vertebrae: No acute fracture is identified, however assessment (particularly for nondisplaced fractures) is limited by the degree of motion artifact. Soft tissues and spinal canal: No prevertebral fluid or swelling. No visible canal hematoma. Disc levels: Advanced disc space narrowing from C4-5 through C7-T1. Multilevel facet arthrosis, most severe at C3-4. Upper chest: Reported on separate chest CT. Other: Mild calcific atherosclerosis at the carotid bifurcations. IMPRESSION: 1. No evidence of acute intracranial abnormality. 2. Motion degraded cervical spine CT. No acute displaced cervical spine fracture identified. Electronically Signed   By: Logan Bores M.D.   On: 08/21/2022 19:40   (Echo, Carotid, EGD, Colonoscopy, ERCP)    Subjective: Patient was seen and examined at bedside.  Overnight events noted. Patient reports feeling much improved.  She is trying to keep herself busy by trying puzzles. She remains on 1 L of supplemental oxygen.  Denies any chest pain or shortness of breath.  Discharge Exam: Vitals:   09/02/22 0616 09/02/22 0730  BP: (!) 177/83   Pulse: 71   Resp: 18   Temp: 97.6 F (36.4 C)   SpO2: 100% 98%   Vitals:   09/01/22 2044 09/01/22 2130 09/02/22 0616 09/02/22 0730  BP:  129/62 (!) 177/83   Pulse:  82 71   Resp:  18 18   Temp:  98.1 F (36.7 C) 97.6 F (36.4 C)   TempSrc:  Oral    SpO2: 98% 100% 100% 98%  Weight:      Height:        General: Pt is alert, awake, not in acute distress Cardiovascular: RRR, S1/S2 +, no rubs, no gallops Respiratory: CTA bilaterally, no wheezing, no rhonchi Abdominal: Soft, NT, ND, bowel sounds + Extremities: no edema, no cyanosis    The results of significant diagnostics from this hospitalization (including imaging, microbiology, ancillary and laboratory) are listed below for reference.     Microbiology: No results found for this or any previous visit (from the past 240 hour(s)).    Labs: BNP (last 3 results) No results for input(s): "BNP" in the last 8760 hours. Basic Metabolic Panel: Recent Labs  Lab 08/28/22 0322 09/02/22 0319  NA 135 133*  K 4.2 4.4  CL 102 101  CO2 25 27  GLUCOSE 86 100*  BUN 20 18  CREATININE 0.74 0.78  CALCIUM 8.8* 8.5*  MG  --  2.0  PHOS  --  3.3   Liver Function Tests: Recent Labs  Lab 08/27/22 0325 08/28/22 0322 09/02/22 0319  AST 44* 34 22  ALT 45* 44 44  ALKPHOS 51 50 64  BILITOT 0.8 0.7 0.8  PROT 5.8* 6.0* 6.0*  ALBUMIN 2.8* 2.9* 3.1*   No results for input(s): "LIPASE", "AMYLASE" in the last 168 hours. No results for input(s): "AMMONIA" in the last 168 hours. CBC: Recent Labs  Lab 09/02/22 0319  WBC 11.3*  HGB 13.8  HCT 42.3  MCV 94.4  PLT 284   Cardiac Enzymes: No results for input(s): "CKTOTAL", "CKMB", "CKMBINDEX", "TROPONINI" in the last 168 hours. BNP: Invalid input(s): "POCBNP" CBG: No results for input(s): "GLUCAP" in the last 168 hours. D-Dimer No results for input(s): "DDIMER" in the last 72 hours. Hgb A1c No results for input(s): "HGBA1C" in the last 72 hours. Lipid Profile No results for input(s): "CHOL", "HDL", "LDLCALC", "TRIG", "CHOLHDL", "LDLDIRECT" in the last 72 hours. Thyroid function studies No results for input(s): "TSH", "T4TOTAL", "T3FREE", "THYROIDAB" in the last 72 hours.  Invalid input(s): "FREET3" Anemia work up No results for input(s): "VITAMINB12", "FOLATE", "FERRITIN", "TIBC", "IRON", "RETICCTPCT" in the last 72 hours. Urinalysis    Component Value Date/Time   COLORURINE YELLOW 08/21/2022 1900   APPEARANCEUR CLOUDY (A) 08/21/2022 1900   LABSPEC 1.016 08/21/2022 1900   PHURINE 5.0 08/21/2022 1900   GLUCOSEU NEGATIVE 08/21/2022 1900   HGBUR MODERATE (A) 08/21/2022 1900   BILIRUBINUR NEGATIVE 08/21/2022 1900   KETONESUR 5 (A) 08/21/2022 1900   PROTEINUR 30 (A) 08/21/2022 1900   NITRITE NEGATIVE 08/21/2022 1900   LEUKOCYTESUR SMALL (A) 08/21/2022 1900   Sepsis  Labs Recent Labs  Lab 09/02/22 0319  WBC 11.3*   Microbiology No results found for this or any previous visit (from the past 240 hour(s)).   Time coordinating discharge: Over 30 minutes  SIGNED:   Shawna Clamp, MD  [facility] 09/02/2022, 12:04 PM Pager   If 7PM-7AM, please contact night-coverage

## 2022-09-02 NOTE — Progress Notes (Signed)
Physical Therapy Treatment Patient Details Name: Marilyn Hobbs MRN: [id number] DOB: 05-05-1929 Today's Date: 09/02/2022   History of Present Illness 88 yo female with weakness and falls, noted to have cellulitis of the right great toe, also Covid +  MRI negative for osteomyelitis.    PMH: HTN, hypothyroidism, COPD, hyperlipidemia, polycthemia vera    PT Comments    General Comments: AxO x 2 pleasant Lady following repeat functional commands.  Poor ST memory. Pt likes to write down things because she knows she forgets. Assisted OOB to [facility] then to amb went "better". General bed mobility comments: required increased time and Max Assist using bed pad to complete scooting. decreased fear anxiety this session. General transfer comment: pt with increased ability to self rise from elevated bed to 1/4 pivot to [facility] no AD just hand assist/transfer.  Asissted off BSC pt was able to rise holding to bed rail which was placed infront of her.  Present with decreased fear/anxiety this session.  All together pt performed 4 transfers.  General Gait Details: pt was able to amb 5 feet with Mod Asisst recliner following behind. Posiitoned in recliner tyo comfort.   Pt will need ST Rehab at SNF to address mobility and functional decline prior to safely returning home.   Recommendations for follow up therapy are one component of a multi-disciplinary discharge planning process, led by the attending physician.  Recommendations may be updated based on patient status, additional functional criteria and insurance authorization.  Follow Up Recommendations  Skilled nursing-short term rehab (<3 hours/day) Can patient physically be transported by private vehicle: No   Assistance Recommended at Discharge Frequent or constant Supervision/Assistance  Patient can return home with the following Two people to help with walking and/or transfers;Two people to help with bathing/dressing/bathroom;Assist for transportation;Help with stairs  or ramp for entrance;Assistance with cooking/housework   Equipment Recommendations  None recommended by PT    Recommendations for Other Services       Precautions / Restrictions Precautions Precautions: Fall Restrictions Weight Bearing Restrictions: No     Mobility  Bed Mobility Overal bed mobility: Needs Assistance Bed Mobility: Supine to Sit     Supine to sit: Max assist     General bed mobility comments: required increased time and Max Assist using bed pad to complete scooting. decreased fear anxiety this session.    Transfers Overall transfer level: Needs assistance Equipment used: None, Rolling walker (2 wheels) Transfers: Sit to/from Stand Sit to Stand: Min assist Stand pivot transfers: Mod assist, Max assist         General transfer comment: pt with increased ability to self rise from elevated bed to 1/4 pivot to [facility] no AD just hand assist/transfer.  Asissted off BSC pt was able to rise holding to bed rail which was placed infront of her.  Present with decreased fear/anxiety this session.  All together pt performed 4 transfers.    Ambulation/Gait Ambulation/Gait assistance: Mod assist, +2 physical assistance, +2 safety/equipment Gait Distance (Feet): 5 Feet Assistive device: Rolling walker (2 wheels) Gait Pattern/deviations: Step-to pattern, Decreased step length - left, Decreased stance time - right Gait velocity: decreased     General Gait Details: pt was able to amb 5 feet with Mod Asisst recliner following behind.   Stairs             Wheelchair Mobility    Modified Rankin (Stroke Patients Only)       Balance  Cognition Arousal/Alertness: Awake/alert Behavior During Therapy: WFL for tasks assessed/performed Overall Cognitive Status: Within Functional Limits for tasks assessed                                 General Comments: AxO x 2 pleasant Lady following  repeat functional commands.  Poor ST memory. Pt likes to write down things because she knows she forgets.        Exercises      General Comments        Pertinent Vitals/Pain Pain Assessment Pain Assessment: No/denies pain    Home Living                          Prior Function            PT Goals (current goals can now be found in the care plan section) Progress towards PT goals: Progressing toward goals    Frequency    Min 2X/week      PT Plan Current plan remains appropriate    Co-evaluation              AM-PAC PT "6 Clicks" Mobility   Outcome Measure  Help needed turning from your back to your side while in a flat bed without using bedrails?: A Lot Help needed moving from lying on your back to sitting on the side of a flat bed without using bedrails?: A Lot Help needed moving to and from a bed to a chair (including a wheelchair)?: A Lot Help needed standing up from a chair using your arms (e.g., wheelchair or bedside chair)?: A Lot Help needed to walk in hospital room?: A Lot Help needed climbing 3-5 steps with a railing? : Total 6 Click Score: 11    End of Session Equipment Utilized During Treatment: Gait belt Activity Tolerance: Patient tolerated treatment well Patient left: in chair;with chair alarm set;with call bell/phone within reach Nurse Communication: Mobility status PT Visit Diagnosis: Difficulty in walking, not elsewhere classified (R26.2);Other abnormalities of gait and mobility (R26.89);Muscle weakness (generalized) (M62.81)     Time: 9211-9417 PT Time Calculation (min) (ACUTE ONLY): 23 min  Charges:  $Gait Training: 8-22 mins $Therapeutic Activity: 8-22 mins                     Rica Koyanagi  PTA [place] Office M-F          [phone] Weekend pager [phone]

## 2022-09-02 NOTE — Plan of Care (Signed)
  Problem: Coping: Goal: Level of anxiety will decrease Outcome: Progressing   Problem: Pain Managment: Goal: General experience of comfort will improve Outcome: Progressing   Problem: Safety: Goal: Ability to remain free from injury will improve Outcome: Progressing   

## 2022-10-23 ENCOUNTER — Telehealth: Payer: Self-pay | Admitting: Internal Medicine

## 2022-10-23 NOTE — Telephone Encounter (Signed)
N/A unable to leave a message for patient to call back to schedule Medicare Annual Wellness Visit   Last AWV  04/18/21  Please schedule at anytime with [facility]-Nurse Health Advisor if patient calls the office back.      Any questions, please call me at [phone]  

## 2023-01-13 ENCOUNTER — Other Ambulatory Visit: Payer: Self-pay | Admitting: Internal Medicine

## 2023-01-13 DIAGNOSIS — E890 Postprocedural hypothyroidism: Secondary | ICD-10-CM

## 2023-03-06 ENCOUNTER — Other Ambulatory Visit: Payer: Self-pay

## 2023-03-06 MED ORDER — AMLODIPINE BESYLATE 5 MG PO TABS: 5.0000 mg | ORAL_TABLET | Freq: Every day | ORAL | 1 refills | Status: DC

## 2023-03-29 ENCOUNTER — Other Ambulatory Visit: Payer: Self-pay | Admitting: Internal Medicine

## 2023-03-29 DIAGNOSIS — E890 Postprocedural hypothyroidism: Secondary | ICD-10-CM

## 2023-04-03 ENCOUNTER — Other Ambulatory Visit: Payer: Self-pay | Admitting: Internal Medicine

## 2023-04-03 DIAGNOSIS — E890 Postprocedural hypothyroidism: Secondary | ICD-10-CM

## 2023-05-03 ENCOUNTER — Other Ambulatory Visit: Payer: Self-pay | Admitting: Internal Medicine

## 2023-05-29 ENCOUNTER — Other Ambulatory Visit: Payer: Self-pay | Admitting: Internal Medicine

## 2023-05-29 DIAGNOSIS — E890 Postprocedural hypothyroidism: Secondary | ICD-10-CM

## 2023-06-04 ENCOUNTER — Other Ambulatory Visit: Payer: Self-pay | Admitting: Internal Medicine

## 2023-06-04 DIAGNOSIS — E890 Postprocedural hypothyroidism: Secondary | ICD-10-CM

## 2023-06-04 DIAGNOSIS — I10 Essential (primary) hypertension: Secondary | ICD-10-CM

## 2023-06-04 MED ORDER — LEVOTHYROXINE SODIUM 25 MCG PO TABS: 25.0000 ug | ORAL_TABLET | Freq: Every day | ORAL | 0 refills | Status: DC

## 2023-06-04 MED ORDER — AMLODIPINE BESYLATE 5 MG PO TABS: 5.0000 mg | ORAL_TABLET | Freq: Every day | ORAL | 0 refills | Status: DC

## 2023-06-09 ENCOUNTER — Encounter: Payer: Self-pay | Admitting: Internal Medicine

## 2023-06-09 ENCOUNTER — Ambulatory Visit (INDEPENDENT_AMBULATORY_CARE_PROVIDER_SITE_OTHER): Payer: Medicare Other | Admitting: Internal Medicine

## 2023-06-09 VITALS — BP 138/76 | HR 85 | Temp 97.9°F | Ht 64.0 in | Wt 132.0 lb

## 2023-06-09 DIAGNOSIS — N1831 Chronic kidney disease, stage 3a: Secondary | ICD-10-CM

## 2023-06-09 DIAGNOSIS — E890 Postprocedural hypothyroidism: Secondary | ICD-10-CM

## 2023-06-09 DIAGNOSIS — I10 Essential (primary) hypertension: Secondary | ICD-10-CM | POA: Diagnosis not present

## 2023-06-09 DIAGNOSIS — E785 Hyperlipidemia, unspecified: Secondary | ICD-10-CM | POA: Diagnosis not present

## 2023-06-09 LAB — LIPID PANEL
Cholesterol: 152 mg/dL (ref 0–200)
HDL: 53.4 mg/dL
LDL Cholesterol: 80 mg/dL (ref 0–99)
NonHDL: 98.69
Total CHOL/HDL Ratio: 3
Triglycerides: 91 mg/dL (ref 0.0–149.0)
VLDL: 18.2 mg/dL (ref 0.0–40.0)

## 2023-06-09 LAB — BASIC METABOLIC PANEL WITH GFR
BUN: 25 mg/dL — ABNORMAL HIGH (ref 6–23)
CO2: 25 meq/L (ref 19–32)
Calcium: 9.9 mg/dL (ref 8.4–10.5)
Chloride: 103 meq/L (ref 96–112)
Creatinine, Ser: 1.1 mg/dL (ref 0.40–1.20)
GFR: 43.69 mL/min — ABNORMAL LOW
Glucose, Bld: 90 mg/dL (ref 70–99)
Potassium: 4.6 meq/L (ref 3.5–5.1)
Sodium: 140 meq/L (ref 135–145)

## 2023-06-09 LAB — CBC WITH DIFFERENTIAL/PLATELET
Basophils Absolute: 0 10*3/uL (ref 0.0–0.1)
Basophils Relative: 0.5 % (ref 0.0–3.0)
Eosinophils Absolute: 0.1 10*3/uL (ref 0.0–0.7)
Eosinophils Relative: 1.2 % (ref 0.0–5.0)
HCT: 47.5 % — ABNORMAL HIGH (ref 36.0–46.0)
Hemoglobin: 15.3 g/dL — ABNORMAL HIGH (ref 12.0–15.0)
Lymphocytes Relative: 12.7 % (ref 12.0–46.0)
Lymphs Abs: 0.9 10*3/uL (ref 0.7–4.0)
MCHC: 32.1 g/dL (ref 30.0–36.0)
MCV: 92.3 fl (ref 78.0–100.0)
Monocytes Absolute: 0.9 10*3/uL (ref 0.1–1.0)
Monocytes Relative: 12 % (ref 3.0–12.0)
Neutro Abs: 5.3 10*3/uL (ref 1.4–7.7)
Neutrophils Relative %: 73.6 % (ref 43.0–77.0)
Platelets: 200 10*3/uL (ref 150.0–400.0)
RBC: 5.15 Mil/uL — ABNORMAL HIGH (ref 3.87–5.11)
RDW: 14.9 % (ref 11.5–15.5)
WBC: 7.2 10*3/uL (ref 4.0–10.5)

## 2023-06-09 LAB — HEPATIC FUNCTION PANEL
ALT: 20 U/L (ref 0–35)
AST: 15 U/L (ref 0–37)
Albumin: 4.1 g/dL (ref 3.5–5.2)
Alkaline Phosphatase: 67 U/L (ref 39–117)
Bilirubin, Direct: 0.2 mg/dL (ref 0.0–0.3)
Total Bilirubin: 1 mg/dL (ref 0.2–1.2)
Total Protein: 7.2 g/dL (ref 6.0–8.3)

## 2023-06-09 LAB — TSH: TSH: 7.63 u[IU]/mL — ABNORMAL HIGH (ref 0.35–5.50)

## 2023-06-09 MED ORDER — LEVOTHYROXINE SODIUM 25 MCG PO TABS: 25.0000 ug | ORAL_TABLET | Freq: Every day | ORAL | 1 refills | Status: DC

## 2023-06-09 MED ORDER — AMLODIPINE BESYLATE 5 MG PO TABS: 5.0000 mg | ORAL_TABLET | Freq: Every day | ORAL | 1 refills | Status: DC

## 2023-06-09 NOTE — Progress Notes (Unsigned)
Subjective:  Patient ID: Marilyn Hobbs, female    DOB: [birth year]  Age: 89 y.o. MRN: [id number]  CC: Hypertension and Hypothyroidism   HPI Marilyn Hobbs presents for f/up ---     Discussed the use of AI scribe software for clinical note transcription with the patient, who gave verbal consent to proceed.  History of Present Illness   The patient presents with a chief complaint of shortness of breath, a symptom she has experienced before. She denies any associated chest pain or changes in weight, appetite, bowel movements, sleep patterns, or fatigue. She also denies any history of lung or heart disease and does not recall any previous consultations with a lung or heart specialist. The patient has had an EKG in the past year due to hospital admissions following falls.  The patient has also experienced episodes of dehydration in the past, but denies any current feelings of dehydration, dizziness, or lightheadedness. She does not report any recent nausea or vomiting. The patient's fluid intake has reportedly improved, but there is a suggestion that it may still be inadequate.  The patient frequently checks her pulse, but no irregularities have been reported. The patient's thyroid dose is perceived as adequate, with no reported symptoms of hypothyroidism or hyperthyroidism.       Outpatient Medications Prior to Visit  Medication Sig Dispense Refill   albuterol (VENTOLIN HFA) 108 (90 Base) MCG/ACT inhaler Inhale 2 puffs into the lungs every 6 (six) hours as needed for wheezing or shortness of breath. 8 g 8   ALPRAZolam (XANAX) 0.25 MG tablet TAKE ONE TABLET BY MOUTH ONE TIME DAILY AS NEEDED ANXIETY 30 tablet 5   aspirin EC 81 MG tablet Take 81 mg by mouth daily. Swallow whole.     carboxymethylcellulose (REFRESH PLUS) 0.5 % SOLN Place 1 drop into both eyes 3 (three) times daily as needed (dry eyes).     Fluticasone-Umeclidin-Vilant (TRELEGY ELLIPTA) 200-62.5-25 MCG/ACT AEPB Inhale 1 puff into the lungs  daily. 1 each 0   Multiple Vitamin (MULTIVITAMIN) capsule Take 1 capsule by mouth daily.     rosuvastatin (CRESTOR) 10 MG tablet TAKE ONE TABLET BY MOUTH ONE TIME DAILY (Patient taking differently: Take 10 mg by mouth daily.) 90 tablet 1   amLODipine (NORVASC) 5 MG tablet Take 1 tablet (5 mg total) by mouth daily. 30 tablet 0   levothyroxine (SYNTHROID) 25 MCG tablet Take 1 tablet (25 mcg total) by mouth daily before breakfast. 30 tablet 0   No facility-administered medications prior to visit.    ROS Review of Systems  Constitutional: Negative.  Negative for appetite change, diaphoresis, fatigue and unexpected weight change.  HENT: Negative.    Respiratory:  Positive for shortness of breath. Negative for chest tightness.   Cardiovascular:  Negative for chest pain, palpitations and leg swelling.  Gastrointestinal:  Negative for abdominal pain, diarrhea, nausea and vomiting.  Endocrine: Negative.   Genitourinary: Negative.  Negative for difficulty urinating.  Musculoskeletal: Negative.  Negative for arthralgias.  Skin: Negative.  Negative for pallor.  Neurological:  Negative for dizziness.  Hematological:  Negative for adenopathy. Does not bruise/bleed easily.  Psychiatric/Behavioral: Negative.      Objective:  BP 138/76 (BP Location: Left Arm, Patient Position: Sitting, Cuff Size: Normal)   Pulse 85   Temp 97.9 F (36.6 C) (Oral)   Ht 5\' 4"  (1.626 m)   Wt 132 lb (59.9 kg)   SpO2 98%   BMI 22.66 kg/m   BP Readings from Last 3 Encounters:  06/09/23 138/76  09/02/22 123/60  01/03/22 112/72    Wt Readings from Last 3 Encounters:  06/09/23 132 lb (59.9 kg)  08/21/22 134 lb (60.8 kg)  01/03/22 134 lb (60.8 kg)    Physical Exam Vitals reviewed.  Constitutional:      Appearance: She is ill-appearing (in a wheelchair).  HENT:     Nose: Nose normal.     Mouth/Throat:     Mouth: Mucous membranes are moist.  Eyes:     General: No scleral icterus.    Conjunctiva/sclera:  Conjunctivae normal.  Cardiovascular:     Rate and Rhythm: Normal rate and regular rhythm.     Heart sounds: No murmur heard.    No friction rub. No gallop.  Pulmonary:     Effort: Pulmonary effort is normal.     Breath sounds: No stridor. No wheezing, rhonchi or rales.  Abdominal:     General: Abdomen is flat.     Palpations: There is no mass.     Tenderness: There is no abdominal tenderness. There is no guarding.     Hernia: No hernia is present.  Musculoskeletal:     Cervical back: Neck supple.     Right lower leg: No edema.     Left lower leg: No edema.  Lymphadenopathy:     Cervical: No cervical adenopathy.  Skin:    General: Skin is warm.  Neurological:     General: No focal deficit present.     Mental Status: She is alert. Mental status is at baseline.  Psychiatric:        Mood and Affect: Mood normal.     Lab Results  Component Value Date   WBC 7.2 06/09/2023   HGB 15.3 (H) 06/09/2023   HCT 47.5 (H) 06/09/2023   PLT 200.0 06/09/2023   GLUCOSE 90 06/09/2023   CHOL 152 06/09/2023   TRIG 91.0 06/09/2023   HDL 53.40 06/09/2023   LDLCALC 80 06/09/2023   ALT 20 06/09/2023   AST 15 06/09/2023   NA 140 06/09/2023   K 4.6 06/09/2023   CL 103 06/09/2023   CREATININE 1.10 06/09/2023   BUN 25 (H) 06/09/2023   CO2 25 06/09/2023   TSH 7.63 (H) 06/09/2023   INR 1.0 08/21/2022    US RENAL  Result Date: 08/22/2022 CLINICAL DATA:  Hydronephrosis EXAM: RENAL / URINARY TRACT ULTRASOUND COMPLETE COMPARISON:  CT abdomen pelvis dated 08/21/2022 FINDINGS: Right Kidney: Renal measurements: 9.4 x 3.4 x 4.0 cm = volume: 65.7 mL. Echogenicity within normal limits. No mass or hydronephrosis visualized. Left Kidney: Renal measurements: 9.1 x 4.8 x 4.5 cm = volume: 102.9 mL. Poorly visualized. Echogenicity within normal limits. No mass or hydronephrosis visualized. Bladder: Appears normal for degree of bladder distention. Other: Incidentally noted is an 8.8 x 5.7 x 6.3 cm simple right  adnexal cysts, visualized on CT. IMPRESSION: No hydronephrosis. 8.8 cm simple right adnexal cyst, visualized on CT. Given the patient's age, this is of questionable clinical significance, and no follow-up is recommended. Electronically Signed   By: Charline Bills M.D.   On: 08/22/2022 01:11   MRI Right foot without contrast  Result Date: 08/21/2022 CLINICAL DATA:  Right toe infection. Multiple falls. EXAM: MRI OF THE RIGHT FOREFOOT WITHOUT CONTRAST TECHNIQUE: Multiplanar, multisequence MR imaging of the right foot was performed. No intravenous contrast was administered. COMPARISON:  Radiographs, same date. FINDINGS: Mild-to-moderate subcutaneous soft tissue swelling/edema suggesting cellulitis. No discrete fluid collection to suggest a drainable abscess. Age related degenerative  changes throughout the foot but no MR findings suspicious for septic arthritis or osteomyelitis. No findings for myofasciitis or pyomyositis. IMPRESSION: 1. Mild-to-moderate subcutaneous soft tissue swelling/edema suggesting cellulitis. 2. No discrete fluid collection to suggest a drainable abscess. 3. No findings for septic arthritis or osteomyelitis. Electronically Signed   By: Rudie Meyer M.D.   On: 08/21/2022 21:32   DG Foot Complete Right  Result Date: 08/21/2022 CLINICAL DATA:  Right toe infection, multiple falls EXAM: RIGHT FOOT COMPLETE - 3+ VIEW COMPARISON:  None Available. FINDINGS: Frontal, oblique, and lateral views of the right foot are obtained. Bones are diffusely osteopenic. No acute displaced fracture. Erosive changes are seen medial margin base of the first distal phalanx concerning for osteomyelitis. There is overlying soft tissue swelling. There is severe multifocal osteoarthritis greatest at the first tarsometatarsal and first metatarsophalangeal joints. Small inferior calcaneal spur. Diffuse soft tissue edema. IMPRESSION: 1. Soft tissue swelling of the first digit, with erosive changes medial aspect base of  the first distal phalanx concerning for osteomyelitis. 2. No acute fracture. 3. Diffuse soft tissue swelling. 4. Severe multifocal osteoarthritis greatest in the first digit. Electronically Signed   By: Sharlet Salina M.D.   On: 08/21/2022 19:44   DG Knee 2 Views Left  Result Date: 08/21/2022 CLINICAL DATA:  Bilateral leg pain, multiple falls EXAM: LEFT KNEE - 1-2 VIEW COMPARISON:  None Available. FINDINGS: Frontal and lateral views of the left knee are obtained. No evidence of acute displaced fracture, subluxation, or dislocation. There is mild 3 compartmental osteoarthritis. Ossification is seen within the lateral collateral ligament complex at the origin on the lateral femoral condyle. This may be sequela from chronic injury. Mild diffuse subcutaneous edema. No joint effusion. IMPRESSION: 1. No acute fracture. 2. Ossification within the LCL complex, likely sequela from chronic trauma. 3. Diffuse subcutaneous edema. 4. Mild 3 compartmental osteoarthritis. Electronically Signed   By: Sharlet Salina M.D.   On: 08/21/2022 19:43   CT CHEST ABDOMEN PELVIS WO CONTRAST  Result Date: 08/21/2022 CLINICAL DATA:  Multiple falls over the last 24 hours, dizziness, bilateral leg pain EXAM: CT CHEST, ABDOMEN AND PELVIS WITHOUT CONTRAST TECHNIQUE: Multidetector CT imaging of the chest, abdomen and pelvis was performed following the standard protocol without IV contrast. Examination was performed without intravenous contrast at the request of the ordering clinician. Evaluation of the soft tissues, solid viscera, vascular structures is limited without IV contrast, especially in the setting of trauma. RADIATION DOSE REDUCTION: This exam was performed according to the departmental dose-optimization program which includes automated exposure control, adjustment of the mA and/or kV according to patient size and/or use of iterative reconstruction technique. COMPARISON:  None Available. FINDINGS: CT CHEST FINDINGS Cardiovascular:  Unenhanced imaging of the heart is unremarkable without pericardial effusion. Normal caliber of the thoracic aorta. Atherosclerosis of the aorta and coronary vasculature. Mediastinum/Nodes: No enlarged mediastinal, hilar, or axillary lymph nodes. Thyroid gland, trachea, and esophagus demonstrate no significant findings. Small hiatal hernia. Lungs/Pleura: No acute airspace disease, effusion, or pneumothorax. Central airways are patent. Musculoskeletal: No acute displaced fracture. Reconstructed images demonstrate no additional findings. CT ABDOMEN PELVIS FINDINGS Hepatobiliary: Indeterminate 2.3 cm hypodensity right lobe liver image 47/3. No biliary duct dilation. The gallbladder is unremarkable. Pancreas: Unremarkable unenhanced appearance. Spleen: Unremarkable unenhanced appearance. Adrenals/Urinary Tract: No evidence of urinary tract calculi. Mild left hydronephrosis and hydroureter of uncertain etiology. Right kidney is otherwise unremarkable. The adrenals are grossly normal. The bladder is moderately distended without filling defect. Stomach/Bowel: No bowel obstruction or  ileus. Normal appendix right lower quadrant. Diverticulosis of the distal colon without diverticulitis. No bowel wall thickening or inflammatory change. Vascular/Lymphatic: Aortic atherosclerosis. No enlarged abdominal or pelvic lymph nodes. Reproductive: Uterus is atrophic. There is a unilocular right ovarian cyst measuring 6.9 x 6.8 x 7.7 cm. No left adnexal masses. Other: No free fluid or free intraperitoneal gas. No abdominal wall hernia. Musculoskeletal: No acute displaced fracture. Prior L1 compression fracture with previous vertebral augmentation. Reconstructed images demonstrate no additional findings. IMPRESSION: 1. No acute intrathoracic, intra-abdominal, or intrapelvic trauma on this unenhanced exam. 2. 7.7 cm right ovarian simple-appearing cyst, not adequately characterized. Recommend follow-up pelvic ultrasound if the patient would  be a therapy candidate should neoplasm be detected. Reference: JACR 2020 Feb;17(2):248-254 3. Indeterminate 2.3 cm right lobe liver hypodensity. Definitive characterization with nonemergent outpatient dedicated liver CT or MRI with and without contrast could be performed if clinically indicated given patient age. 4. Mild left-sided hydronephrosis and proximal hydroureter without clear etiology. No evidence of urinary tract calculus. 5. Small hiatal hernia. 6. Distal colonic diverticulosis without diverticulitis. 7.  Aortic Atherosclerosis (ICD10-I70.0). Electronically Signed   By: Sharlet Salina M.D.   On: 08/21/2022 19:41   CT Head Wo Contrast  Result Date: 08/21/2022 CLINICAL DATA:  Multiple recent falls.  Dizziness. EXAM: CT HEAD WITHOUT CONTRAST CT CERVICAL SPINE WITHOUT CONTRAST TECHNIQUE: Multidetector CT imaging of the head and cervical spine was performed following the standard protocol without intravenous contrast. Multiplanar CT image reconstructions of the cervical spine were also generated. RADIATION DOSE REDUCTION: This exam was performed according to the departmental dose-optimization program which includes automated exposure control, adjustment of the mA and/or kV according to patient size and/or use of iterative reconstruction technique. COMPARISON:  Head CT 10/08/2021 FINDINGS: CT HEAD FINDINGS Brain: There is no evidence of an acute infarct, intracranial hemorrhage, mass, midline shift, or extra-axial fluid collection. Hypodensities in the cerebral white matter bilaterally are unchanged and nonspecific but compatible with mild chronic small vessel ischemic disease. Cerebral atrophy is within normal limits for age. Vascular: Calcified atherosclerosis at the skull base. No hyperdense vessel. Skull: No acute fracture or suspicious osseous lesion. Sinuses/Orbits: Mild mucosal thickening in multiple left ethmoid air cells. Partially visualized mild mucosal thickening and small volume fluid in the  left maxillary sinus. Clear mastoid air cells. Right cataract extraction. Other: None. CT CERVICAL SPINE FINDINGS The study is motion degraded, including moderate to severe motion artifact from C2-C4. Alignment: 3 mm anterolisthesis of C7 on T1, likely degenerative and facet mediated. Skull base and vertebrae: No acute fracture is identified, however assessment (particularly for nondisplaced fractures) is limited by the degree of motion artifact. Soft tissues and spinal canal: No prevertebral fluid or swelling. No visible canal hematoma. Disc levels: Advanced disc space narrowing from C4-5 through C7-T1. Multilevel facet arthrosis, most severe at C3-4. Upper chest: Reported on separate chest CT. Other: Mild calcific atherosclerosis at the carotid bifurcations. IMPRESSION: 1. No evidence of acute intracranial abnormality. 2. Motion degraded cervical spine CT. No acute displaced cervical spine fracture identified. Electronically Signed   By: Sebastian Ache M.D.   On: 08/21/2022 19:40   CT Cervical Spine Wo Contrast  Result Date: 08/21/2022 CLINICAL DATA:  Multiple recent falls.  Dizziness. EXAM: CT HEAD WITHOUT CONTRAST CT CERVICAL SPINE WITHOUT CONTRAST TECHNIQUE: Multidetector CT imaging of the head and cervical spine was performed following the standard protocol without intravenous contrast. Multiplanar CT image reconstructions of the cervical spine were also generated. RADIATION DOSE REDUCTION: This exam  was performed according to the departmental dose-optimization program which includes automated exposure control, adjustment of the mA and/or kV according to patient size and/or use of iterative reconstruction technique. COMPARISON:  Head CT 10/08/2021 FINDINGS: CT HEAD FINDINGS Brain: There is no evidence of an acute infarct, intracranial hemorrhage, mass, midline shift, or extra-axial fluid collection. Hypodensities in the cerebral white matter bilaterally are unchanged and nonspecific but compatible with mild  chronic small vessel ischemic disease. Cerebral atrophy is within normal limits for age. Vascular: Calcified atherosclerosis at the skull base. No hyperdense vessel. Skull: No acute fracture or suspicious osseous lesion. Sinuses/Orbits: Mild mucosal thickening in multiple left ethmoid air cells. Partially visualized mild mucosal thickening and small volume fluid in the left maxillary sinus. Clear mastoid air cells. Right cataract extraction. Other: None. CT CERVICAL SPINE FINDINGS The study is motion degraded, including moderate to severe motion artifact from C2-C4. Alignment: 3 mm anterolisthesis of C7 on T1, likely degenerative and facet mediated. Skull base and vertebrae: No acute fracture is identified, however assessment (particularly for nondisplaced fractures) is limited by the degree of motion artifact. Soft tissues and spinal canal: No prevertebral fluid or swelling. No visible canal hematoma. Disc levels: Advanced disc space narrowing from C4-5 through C7-T1. Multilevel facet arthrosis, most severe at C3-4. Upper chest: Reported on separate chest CT. Other: Mild calcific atherosclerosis at the carotid bifurcations. IMPRESSION: 1. No evidence of acute intracranial abnormality. 2. Motion degraded cervical spine CT. No acute displaced cervical spine fracture identified. Electronically Signed   By: Sebastian Ache M.D.   On: 08/21/2022 19:40    Assessment & Plan:   Essential hypertension- BP is well controlled. -     Basic metabolic panel; Future -     CBC with Differential/Platelet; Future -     TSH; Future -     Hepatic function panel; Future -     amLODIPine Besylate; Take 1 tablet (5 mg total) by mouth daily.  Dispense: 90 tablet; Refill: 1  Stage 3a chronic kidney disease (CKD) (HCC) -     Basic metabolic panel; Future  Postoperative hypothyroidism- She is euthyroid. -     TSH; Future -     Levothyroxine Sodium; Take 1 tablet (25 mcg total) by mouth daily before breakfast.  Dispense: 90  tablet; Refill: 1  Dyslipidemia, goal LDL below 130 -     Lipid panel; Future -     TSH; Future -     Hepatic function panel; Future     Follow-up: Return in about 6 months (around 12/10/2023).  Sanda Linger, MD

## 2023-06-09 NOTE — Patient Instructions (Signed)
Abdominal Pain, Adult  Pain in the abdomen (abdominal pain) can be caused by many things. In most cases, it gets better with no treatment or by being treated at home. But in some cases, it can be serious. Your health care provider will ask questions about your medical history and do a physical exam to try to figure out what is causing your pain. Follow these instructions at home: Medicines Take over-the-counter and prescription medicines only as told by your provider. Do not take medicines that help you poop (laxatives) unless told by your provider. General instructions Watch your condition for any changes. Drink enough fluid to keep your pee (urine) pale yellow. Contact a health care provider if: Your pain changes, gets worse, or lasts longer than expected. You have severe cramping or bloating in your abdomen, or you vomit. Your pain gets worse with meals, after eating, or with certain foods. You are constipated or have diarrhea for more than 2-3 days. You are not hungry, or you lose weight without trying. You have signs of dehydration. These may include: Dark pee, very little pee, or no pee. Cracked lips or dry mouth. Sleepiness or weakness. You have pain when you pee (urinate) or poop. Your abdominal pain wakes you up at night. You have blood in your pee. You have a fever. Get help right away if: You cannot stop vomiting. Your pain is only in one part of the abdomen. Pain on the right side could be caused by appendicitis. You have bloody or black poop (stool), or poop that looks like tar. You have trouble breathing. You have chest pain. These symptoms may be an emergency. Get help right away. Call 911. Do not wait to see if the symptoms will go away. Do not drive yourself to the hospital. This information is not intended to replace advice given to you by your health care provider. Make sure you discuss any questions you have with your health care provider. Document Revised:  08/21/2022 Document Reviewed: 08/21/2022 Elsevier Patient Education  2024 Elsevier Inc.  

## 2023-06-10 ENCOUNTER — Encounter: Payer: Self-pay | Admitting: Internal Medicine

## 2023-06-13 ENCOUNTER — Other Ambulatory Visit: Payer: Self-pay

## 2023-06-13 ENCOUNTER — Other Ambulatory Visit: Payer: Self-pay | Admitting: Internal Medicine

## 2023-06-13 DIAGNOSIS — E785 Hyperlipidemia, unspecified: Secondary | ICD-10-CM

## 2023-06-13 MED ORDER — ROSUVASTATIN CALCIUM 10 MG PO TABS: 10.0000 mg | ORAL_TABLET | Freq: Every day | ORAL | 0 refills | Status: DC

## 2023-06-13 NOTE — Telephone Encounter (Signed)
Patient daughter called as patient is very worried about running out of her Crestor. I was able to send in a 30 supply to local pharmacy, labs stable from 7/22 appointment. Patient would like to have a 90 supply send to mail order for all [facility] medication so she does not have to keep going to pharmacy, and per Insurance $0 for 90days when ordered this way.  No additional questions at this time.

## 2023-06-14 ENCOUNTER — Other Ambulatory Visit: Payer: Self-pay | Admitting: Internal Medicine

## 2023-06-14 DIAGNOSIS — E785 Hyperlipidemia, unspecified: Secondary | ICD-10-CM

## 2023-06-14 MED ORDER — ROSUVASTATIN CALCIUM 10 MG PO TABS: 10.0000 mg | ORAL_TABLET | Freq: Every day | ORAL | 1 refills | Status: DC

## 2023-06-27 ENCOUNTER — Other Ambulatory Visit: Payer: Self-pay | Admitting: Internal Medicine

## 2023-06-27 DIAGNOSIS — E785 Hyperlipidemia, unspecified: Secondary | ICD-10-CM

## 2023-06-30 ENCOUNTER — Ambulatory Visit: Payer: Medicare Other | Admitting: Internal Medicine

## 2023-09-30 ENCOUNTER — Other Ambulatory Visit: Payer: Self-pay | Admitting: Internal Medicine

## 2023-09-30 DIAGNOSIS — E890 Postprocedural hypothyroidism: Secondary | ICD-10-CM

## 2023-10-09 ENCOUNTER — Other Ambulatory Visit: Payer: Self-pay | Admitting: Internal Medicine

## 2023-10-09 DIAGNOSIS — I10 Essential (primary) hypertension: Secondary | ICD-10-CM

## 2023-12-29 ENCOUNTER — Other Ambulatory Visit: Payer: Self-pay | Admitting: Internal Medicine

## 2023-12-29 DIAGNOSIS — E890 Postprocedural hypothyroidism: Secondary | ICD-10-CM

## 2024-02-06 ENCOUNTER — Telehealth: Payer: Self-pay

## 2024-02-06 ENCOUNTER — Other Ambulatory Visit: Payer: Self-pay | Admitting: Internal Medicine

## 2024-02-06 DIAGNOSIS — I10 Essential (primary) hypertension: Secondary | ICD-10-CM

## 2024-02-06 NOTE — Telephone Encounter (Signed)
 Copied from CRM [id number]. Topic: Clinical - Medical Advice >> Feb 05, 2024  5:03 PM Presley Raddle C wrote: Reason for CRM: pt's daughter, Eunice Blase, called and stated that they have being taking the patient to several other emergency appointments due to her breaking her ankle last Thursday. She explained that they're are aware Dr. Yetta Barre needs to see the patient for an appt to refill her prescriptions for levothyroxine & Rosuvastatin. However, they are unable to bring her in at the moment due to their situation and asked if Dr. Yetta Barre could still refill it until things slow down for them to take the patient in for an appointment. Please call and advise.

## 2024-02-09 ENCOUNTER — Other Ambulatory Visit: Payer: Self-pay | Admitting: Internal Medicine

## 2024-02-09 DIAGNOSIS — E890 Postprocedural hypothyroidism: Secondary | ICD-10-CM

## 2024-02-09 DIAGNOSIS — E785 Hyperlipidemia, unspecified: Secondary | ICD-10-CM

## 2024-02-09 MED ORDER — ROSUVASTATIN CALCIUM 10 MG PO TABS: 10.0000 mg | ORAL_TABLET | Freq: Every day | ORAL | 0 refills | Status: DC

## 2024-02-09 MED ORDER — LEVOTHYROXINE SODIUM 25 MCG PO TABS: 25.0000 ug | ORAL_TABLET | Freq: Every day | ORAL | 0 refills | Status: DC

## 2024-02-09 NOTE — Telephone Encounter (Signed)
**Note De-identified  [place] Obfuscation** Please advise 

## 2024-03-20 ENCOUNTER — Other Ambulatory Visit: Payer: Self-pay | Admitting: Internal Medicine

## 2024-03-20 DIAGNOSIS — I10 Essential (primary) hypertension: Secondary | ICD-10-CM

## 2024-03-26 ENCOUNTER — Encounter: Payer: Self-pay | Admitting: Internal Medicine

## 2024-03-26 ENCOUNTER — Ambulatory Visit (INDEPENDENT_AMBULATORY_CARE_PROVIDER_SITE_OTHER): Admitting: Internal Medicine

## 2024-03-26 VITALS — BP 118/64 | HR 70 | Temp 97.9°F | Ht 64.0 in | Wt 145.0 lb

## 2024-03-26 DIAGNOSIS — J449 Chronic obstructive pulmonary disease, unspecified: Secondary | ICD-10-CM

## 2024-03-26 DIAGNOSIS — E890 Postprocedural hypothyroidism: Secondary | ICD-10-CM

## 2024-03-26 DIAGNOSIS — N1831 Chronic kidney disease, stage 3a: Secondary | ICD-10-CM

## 2024-03-26 DIAGNOSIS — E785 Hyperlipidemia, unspecified: Secondary | ICD-10-CM | POA: Diagnosis not present

## 2024-03-26 DIAGNOSIS — I10 Essential (primary) hypertension: Secondary | ICD-10-CM | POA: Diagnosis not present

## 2024-03-26 LAB — COMPREHENSIVE METABOLIC PANEL WITH GFR
ALT: 18 U/L (ref 0–35)
AST: 16 U/L (ref 0–37)
Albumin: 4.3 g/dL (ref 3.5–5.2)
Alkaline Phosphatase: 84 U/L (ref 39–117)
BUN: 23 mg/dL (ref 6–23)
CO2: 28 meq/L (ref 19–32)
Calcium: 9.7 mg/dL (ref 8.4–10.5)
Chloride: 97 meq/L (ref 96–112)
Creatinine, Ser: 1.12 mg/dL (ref 0.40–1.20)
GFR: 42.51 mL/min — ABNORMAL LOW (ref >60.00)
Glucose, Bld: 67 mg/dL — ABNORMAL LOW (ref 70–99)
Potassium: 4.6 meq/L (ref 3.5–5.1)
Sodium: 136 meq/L (ref 135–145)
Total Bilirubin: 0.9 mg/dL (ref 0.2–1.2)
Total Protein: 7.4 g/dL (ref 6.0–8.3)

## 2024-03-26 LAB — TSH: TSH: 11.05 u[IU]/mL — ABNORMAL HIGH (ref 0.35–5.50)

## 2024-03-26 MED ORDER — ROSUVASTATIN CALCIUM 10 MG PO TABS: 10.0000 mg | ORAL_TABLET | Freq: Every day | ORAL | 1 refills | Status: DC

## 2024-03-26 MED ORDER — ALBUTEROL SULFATE HFA 108 (90 BASE) MCG/ACT IN AERS: 2.0000 | INHALATION_SPRAY | Freq: Four times a day (QID) | RESPIRATORY_TRACT | 8 refills | Status: AC | PRN

## 2024-03-26 MED ORDER — LEVOTHYROXINE SODIUM 25 MCG PO TABS: 25.0000 ug | ORAL_TABLET | Freq: Every day | ORAL | 1 refills | Status: DC

## 2024-03-26 MED ORDER — AMLODIPINE BESYLATE 5 MG PO TABS: 5.0000 mg | ORAL_TABLET | Freq: Every day | ORAL | 1 refills | Status: DC

## 2024-03-26 NOTE — Progress Notes (Signed)
 Subjective:  Patient ID: Marilyn Hobbs, female    DOB: 13-Feb-1929  Age: 89 y.o. MRN: [id number]  CC: Medical Management of Chronic Issues (Pt is needing a med refill and also has concerns about SOB and lightheadedness )   HPI Marilyn Hobbs presents for HTN, anxiety, hypothyroidism, COPD She is here w/dtr  Outpatient Medications Prior to Visit  Medication Sig Dispense Refill   ALPRAZolam  (XANAX ) 0.25 MG tablet TAKE ONE TABLET BY MOUTH ONE TIME DAILY AS NEEDED ANXIETY 30 tablet 5   aspirin  EC 81 MG tablet Take 81 mg by mouth daily. Swallow whole.     carboxymethylcellulose (REFRESH PLUS) 0.5 % SOLN Place 1 drop into both eyes 3 (three) times daily as needed (dry eyes).     Fluticasone -Umeclidin-Vilant (TRELEGY ELLIPTA ) 200-62.5-25 MCG/ACT AEPB Inhale 1 puff into the lungs daily. 1 each 0   Multiple Vitamin (MULTIVITAMIN) capsule Take 1 capsule by mouth daily.     albuterol  (VENTOLIN  HFA) 108 (90 Base) MCG/ACT inhaler Inhale 2 puffs into the lungs every 6 (six) hours as needed for wheezing or shortness of breath. 8 g 8   amLODipine  (NORVASC ) 5 MG tablet TAKE ONE TABLET BY MOUTH ONE TIME DAILY 90 tablet 0   levothyroxine  (SYNTHROID ) 25 MCG tablet Take 1 tablet (25 mcg total) by mouth daily before breakfast. 30 tablet 0   rosuvastatin  (CRESTOR ) 10 MG tablet Take 1 tablet (10 mg total) by mouth daily. 30 tablet 0   No facility-administered medications prior to visit.    ROS: Review of Systems  Constitutional:  Negative for activity change, appetite change, chills, fatigue and unexpected weight change.  HENT:  Negative for congestion, mouth sores and sinus pressure.   Eyes:  Negative for visual disturbance.  Respiratory:  Negative for cough and chest tightness.   Gastrointestinal:  Negative for abdominal pain and nausea.  Genitourinary:  Negative for difficulty urinating, frequency and vaginal pain.  Musculoskeletal:  Positive for gait problem. Negative for back pain.  Skin:  Negative for  pallor and rash.  Neurological:  Negative for dizziness, tremors, weakness, numbness and headaches.  Psychiatric/Behavioral:  Positive for sleep disturbance. Negative for confusion and suicidal ideas. The patient is not nervous/anxious.     Objective:  BP 118/64   Pulse 70   Temp 97.9 F (36.6 C) (Oral)   Ht 5\' 4"  (1.626 m)   Wt 145 lb (65.8 kg)   SpO2 98%   BMI 24.89 kg/m   BP Readings from Last 3 Encounters:  03/26/24 118/64  06/09/23 138/76  09/02/22 123/60    Wt Readings from Last 3 Encounters:  03/26/24 145 lb (65.8 kg)  06/09/23 132 lb (59.9 kg)  08/21/22 134 lb (60.8 kg)    Physical Exam Constitutional:      General: She is not in acute distress.    Appearance: Normal appearance. She is well-developed.  HENT:     Head: Normocephalic.     Right Ear: External ear normal.     Left Ear: External ear normal.     Nose: Nose normal.  Eyes:     General:        Right eye: No discharge.        Left eye: No discharge.     Conjunctiva/sclera: Conjunctivae normal.     Pupils: Pupils are equal, round, and reactive to light.  Neck:     Thyroid : No thyromegaly.     Vascular: No JVD.     Trachea: No tracheal deviation.  Cardiovascular:  Rate and Rhythm: Normal rate and regular rhythm.     Heart sounds: Normal heart sounds.  Pulmonary:     Effort: No respiratory distress.     Breath sounds: No stridor. No wheezing.  Abdominal:     General: Bowel sounds are normal. There is no distension.     Palpations: Abdomen is soft. There is no mass.     Tenderness: There is no abdominal tenderness. There is no guarding or rebound.  Musculoskeletal:        General: No tenderness.     Cervical back: Normal range of motion and neck supple. No rigidity.     Right lower leg: No edema.     Left lower leg: No edema.  Lymphadenopathy:     Cervical: No cervical adenopathy.  Skin:    Findings: No erythema or rash.  Neurological:     Mental Status: She is oriented to person,  place, and time.     Cranial Nerves: No cranial nerve deficit.     Motor: No abnormal muscle tone.     Coordination: Coordination abnormal.     Gait: Gait abnormal.     Deep Tendon Reflexes: Reflexes normal.  Psychiatric:        Behavior: Behavior normal.        Thought Content: Thought content normal.        Judgment: Judgment normal.   Using a walker R foot is in the boot   Lab Results  Component Value Date   WBC 7.2 06/09/2023   HGB 15.3 (H) 06/09/2023   HCT 47.5 (H) 06/09/2023   PLT 200.0 06/09/2023   GLUCOSE 90 06/09/2023   CHOL 152 06/09/2023   TRIG 91.0 06/09/2023   HDL 53.40 06/09/2023   LDLCALC 80 06/09/2023   ALT 20 06/09/2023   AST 15 06/09/2023   NA 140 06/09/2023   K 4.6 06/09/2023   CL 103 06/09/2023   CREATININE 1.10 06/09/2023   BUN 25 (H) 06/09/2023   CO2 25 06/09/2023   TSH 7.63 (H) 06/09/2023   INR 1.0 08/21/2022    US  RENAL Result Date: 08/22/2022 CLINICAL DATA:  Hydronephrosis EXAM: RENAL / URINARY TRACT ULTRASOUND COMPLETE COMPARISON:  CT abdomen pelvis dated 08/21/2022 FINDINGS: Right Kidney: Renal measurements: 9.4 x 3.4 x 4.0 cm = volume: 65.7 mL. Echogenicity within normal limits. No mass or hydronephrosis visualized. Left Kidney: Renal measurements: 9.1 x 4.8 x 4.5 cm = volume: 102.9 mL. Poorly visualized. Echogenicity within normal limits. No mass or hydronephrosis visualized. Bladder: Appears normal for degree of bladder distention. Other: Incidentally noted is an 8.8 x 5.7 x 6.3 cm simple right adnexal cysts, visualized on CT. IMPRESSION: No hydronephrosis. 8.8 cm simple right adnexal cyst, visualized on CT. Given the patient's age, this is of questionable clinical significance, and no follow-up is recommended. Electronically Signed   By: Zadie Herter M.D.   On: 08/22/2022 01:11   MRI Right foot without contrast Result Date: 08/21/2022 CLINICAL DATA:  Right toe infection. Multiple falls. EXAM: MRI OF THE RIGHT FOREFOOT WITHOUT CONTRAST  TECHNIQUE: Multiplanar, multisequence MR imaging of the right foot was performed. No intravenous contrast was administered. COMPARISON:  Radiographs, same date. FINDINGS: Mild-to-moderate subcutaneous soft tissue swelling/edema suggesting cellulitis. No discrete fluid collection to suggest a drainable abscess. Age related degenerative changes throughout the foot but no MR findings suspicious for septic arthritis or osteomyelitis. No findings for myofasciitis or pyomyositis. IMPRESSION: 1. Mild-to-moderate subcutaneous soft tissue swelling/edema suggesting cellulitis. 2. No discrete fluid collection  to suggest a drainable abscess. 3. No findings for septic arthritis or osteomyelitis. Electronically Signed   By: Marrian Siva M.D.   On: 08/21/2022 21:32   DG Foot Complete Right Result Date: 08/21/2022 CLINICAL DATA:  Right toe infection, multiple falls EXAM: RIGHT FOOT COMPLETE - 3+ VIEW COMPARISON:  None Available. FINDINGS: Frontal, oblique, and lateral views of the right foot are obtained. Bones are diffusely osteopenic. No acute displaced fracture. Erosive changes are seen medial margin base of the first distal phalanx concerning for osteomyelitis. There is overlying soft tissue swelling. There is severe multifocal osteoarthritis greatest at the first tarsometatarsal and first metatarsophalangeal joints. Small inferior calcaneal spur. Diffuse soft tissue edema. IMPRESSION: 1. Soft tissue swelling of the first digit, with erosive changes medial aspect base of the first distal phalanx concerning for osteomyelitis. 2. No acute fracture. 3. Diffuse soft tissue swelling. 4. Severe multifocal osteoarthritis greatest in the first digit. Electronically Signed   By: Bobbye Burrow M.D.   On: 08/21/2022 19:44   DG Knee 2 Views Left Result Date: 08/21/2022 CLINICAL DATA:  Bilateral leg pain, multiple falls EXAM: LEFT KNEE - 1-2 VIEW COMPARISON:  None Available. FINDINGS: Frontal and lateral views of the left knee are  obtained. No evidence of acute displaced fracture, subluxation, or dislocation. There is mild 3 compartmental osteoarthritis. Ossification is seen within the lateral collateral ligament complex at the origin on the lateral femoral condyle. This may be sequela from chronic injury. Mild diffuse subcutaneous edema. No joint effusion. IMPRESSION: 1. No acute fracture. 2. Ossification within the LCL complex, likely sequela from chronic trauma. 3. Diffuse subcutaneous edema. 4. Mild 3 compartmental osteoarthritis. Electronically Signed   By: Bobbye Burrow M.D.   On: 08/21/2022 19:43   CT CHEST ABDOMEN PELVIS WO CONTRAST Result Date: 08/21/2022 CLINICAL DATA:  Multiple falls over the last 24 hours, dizziness, bilateral leg pain EXAM: CT CHEST, ABDOMEN AND PELVIS WITHOUT CONTRAST TECHNIQUE: Multidetector CT imaging of the chest, abdomen and pelvis was performed following the standard protocol without IV contrast. Examination was performed without intravenous contrast at the request of the ordering clinician. Evaluation of the soft tissues, solid viscera, vascular structures is limited without IV contrast, especially in the setting of trauma. RADIATION DOSE REDUCTION: This exam was performed according to the departmental dose-optimization program which includes automated exposure control, adjustment of the mA and/or kV according to patient size and/or use of iterative reconstruction technique. COMPARISON:  None Available. FINDINGS: CT CHEST FINDINGS Cardiovascular: Unenhanced imaging of the heart is unremarkable without pericardial effusion. Normal caliber of the thoracic aorta. Atherosclerosis of the aorta and coronary vasculature. Mediastinum/Nodes: No enlarged mediastinal, hilar, or axillary lymph nodes. Thyroid  gland, trachea, and esophagus demonstrate no significant findings. Small hiatal hernia. Lungs/Pleura: No acute airspace disease, effusion, or pneumothorax. Central airways are patent. Musculoskeletal: No acute  displaced fracture. Reconstructed images demonstrate no additional findings. CT ABDOMEN PELVIS FINDINGS Hepatobiliary: Indeterminate 2.3 cm hypodensity right lobe liver image 47/3. No biliary duct dilation. The gallbladder is unremarkable. Pancreas: Unremarkable unenhanced appearance. Spleen: Unremarkable unenhanced appearance. Adrenals/Urinary Tract: No evidence of urinary tract calculi. Mild left hydronephrosis and hydroureter of uncertain etiology. Right kidney is otherwise unremarkable. The adrenals are grossly normal. The bladder is moderately distended without filling defect. Stomach/Bowel: No bowel obstruction or ileus. Normal appendix right lower quadrant. Diverticulosis of the distal colon without diverticulitis. No bowel wall thickening or inflammatory change. Vascular/Lymphatic: Aortic atherosclerosis. No enlarged abdominal or pelvic lymph nodes. Reproductive: Uterus is atrophic. There is a  unilocular right ovarian cyst measuring 6.9 x 6.8 x 7.7 cm. No left adnexal masses. Other: No free fluid or free intraperitoneal gas. No abdominal wall hernia. Musculoskeletal: No acute displaced fracture. Prior L1 compression fracture with previous vertebral augmentation. Reconstructed images demonstrate no additional findings. IMPRESSION: 1. No acute intrathoracic, intra-abdominal, or intrapelvic trauma on this unenhanced exam. 2. 7.7 cm right ovarian simple-appearing cyst, not adequately characterized. Recommend follow-up pelvic ultrasound if the patient would be a therapy candidate should neoplasm be detected. Reference: JACR 2020 Feb;17(2):248-254 3. Indeterminate 2.3 cm right lobe liver hypodensity. Definitive characterization with nonemergent outpatient dedicated liver CT or MRI with and without contrast could be performed if clinically indicated given patient age. 4. Mild left-sided hydronephrosis and proximal hydroureter without clear etiology. No evidence of urinary tract calculus. 5. Small hiatal hernia. 6.  Distal colonic diverticulosis without diverticulitis. 7.  Aortic Atherosclerosis (ICD10-I70.0). Electronically Signed   By: Bobbye Burrow M.D.   On: 08/21/2022 19:41   CT Head Wo Contrast Result Date: 08/21/2022 CLINICAL DATA:  Multiple recent falls.  Dizziness. EXAM: CT HEAD WITHOUT CONTRAST CT CERVICAL SPINE WITHOUT CONTRAST TECHNIQUE: Multidetector CT imaging of the head and cervical spine was performed following the standard protocol without intravenous contrast. Multiplanar CT image reconstructions of the cervical spine were also generated. RADIATION DOSE REDUCTION: This exam was performed according to the departmental dose-optimization program which includes automated exposure control, adjustment of the mA and/or kV according to patient size and/or use of iterative reconstruction technique. COMPARISON:  Head CT 10/08/2021 FINDINGS: CT HEAD FINDINGS Brain: There is no evidence of an acute infarct, intracranial hemorrhage, mass, midline shift, or extra-axial fluid collection. Hypodensities in the cerebral white matter bilaterally are unchanged and nonspecific but compatible with mild chronic small vessel ischemic disease. Cerebral atrophy is within normal limits for age. Vascular: Calcified atherosclerosis at the skull base. No hyperdense vessel. Skull: No acute fracture or suspicious osseous lesion. Sinuses/Orbits: Mild mucosal thickening in multiple left ethmoid air cells. Partially visualized mild mucosal thickening and small volume fluid in the left maxillary sinus. Clear mastoid air cells. Right cataract extraction. Other: None. CT CERVICAL SPINE FINDINGS The study is motion degraded, including moderate to severe motion artifact from C2-C4. Alignment: 3 mm anterolisthesis of C7 on T1, likely degenerative and facet mediated. Skull base and vertebrae: No acute fracture is identified, however assessment (particularly for nondisplaced fractures) is limited by the degree of motion artifact. Soft tissues and  spinal canal: No prevertebral fluid or swelling. No visible canal hematoma. Disc levels: Advanced disc space narrowing from C4-5 through C7-T1. Multilevel facet arthrosis, most severe at C3-4. Upper chest: Reported on separate chest CT. Other: Mild calcific atherosclerosis at the carotid bifurcations. IMPRESSION: 1. No evidence of acute intracranial abnormality. 2. Motion degraded cervical spine CT. No acute displaced cervical spine fracture identified. Electronically Signed   By: Aundra Lee M.D.   On: 08/21/2022 19:40   CT Cervical Spine Wo Contrast Result Date: 08/21/2022 CLINICAL DATA:  Multiple recent falls.  Dizziness. EXAM: CT HEAD WITHOUT CONTRAST CT CERVICAL SPINE WITHOUT CONTRAST TECHNIQUE: Multidetector CT imaging of the head and cervical spine was performed following the standard protocol without intravenous contrast. Multiplanar CT image reconstructions of the cervical spine were also generated. RADIATION DOSE REDUCTION: This exam was performed according to the departmental dose-optimization program which includes automated exposure control, adjustment of the mA and/or kV according to patient size and/or use of iterative reconstruction technique. COMPARISON:  Head CT 10/08/2021 FINDINGS: CT HEAD FINDINGS Brain:  There is no evidence of an acute infarct, intracranial hemorrhage, mass, midline shift, or extra-axial fluid collection. Hypodensities in the cerebral white matter bilaterally are unchanged and nonspecific but compatible with mild chronic small vessel ischemic disease. Cerebral atrophy is within normal limits for age. Vascular: Calcified atherosclerosis at the skull base. No hyperdense vessel. Skull: No acute fracture or suspicious osseous lesion. Sinuses/Orbits: Mild mucosal thickening in multiple left ethmoid air cells. Partially visualized mild mucosal thickening and small volume fluid in the left maxillary sinus. Clear mastoid air cells. Right cataract extraction. Other: None. CT CERVICAL  SPINE FINDINGS The study is motion degraded, including moderate to severe motion artifact from C2-C4. Alignment: 3 mm anterolisthesis of C7 on T1, likely degenerative and facet mediated. Skull base and vertebrae: No acute fracture is identified, however assessment (particularly for nondisplaced fractures) is limited by the degree of motion artifact. Soft tissues and spinal canal: No prevertebral fluid or swelling. No visible canal hematoma. Disc levels: Advanced disc space narrowing from C4-5 through C7-T1. Multilevel facet arthrosis, most severe at C3-4. Upper chest: Reported on separate chest CT. Other: Mild calcific atherosclerosis at the carotid bifurcations. IMPRESSION: 1. No evidence of acute intracranial abnormality. 2. Motion degraded cervical spine CT. No acute displaced cervical spine fracture identified. Electronically Signed   By: Aundra Lee M.D.   On: 08/21/2022 19:40    Assessment & Plan:   Problem List Items Addressed This Visit     Essential hypertension - Primary   BP is controlled Norvasc  was renewed Check CMET       Relevant Medications   amLODipine  (NORVASC ) 5 MG tablet   rosuvastatin  (CRESTOR ) 10 MG tablet   Other Relevant Orders   Comprehensive metabolic panel with GFR   RESOLVED: CKD (chronic kidney disease), stage III (HCC)   Check GFR Hydrate better      Relevant Orders   Comprehensive metabolic panel with GFR   Postoperative hypothyroidism   On Levothyroxine  Check TSH      Relevant Medications   levothyroxine  (SYNTHROID ) 25 MCG tablet   Other Relevant Orders   Comprehensive metabolic panel with GFR   TSH   COPD (chronic obstructive pulmonary disease) (HCC)   Albuterol  MDI prn      Relevant Medications   albuterol  (VENTOLIN  HFA) 108 (90 Base) MCG/ACT inhaler   Dyslipidemia, goal LDL below 130   Relevant Medications   amLODipine  (NORVASC ) 5 MG tablet   rosuvastatin  (CRESTOR ) 10 MG tablet      Meds ordered this encounter  Medications    albuterol  (VENTOLIN  HFA) 108 (90 Base) MCG/ACT inhaler    Sig: Inhale 2 puffs into the lungs every 6 (six) hours as needed for wheezing or shortness of breath.    Dispense:  8 g    Refill:  8   amLODipine  (NORVASC ) 5 MG tablet    Sig: Take 1 tablet (5 mg total) by mouth daily.    Dispense:  90 tablet    Refill:  1   levothyroxine  (SYNTHROID ) 25 MCG tablet    Sig: Take 1 tablet (25 mcg total) by mouth daily before breakfast.    Dispense:  90 tablet    Refill:  1   rosuvastatin  (CRESTOR ) 10 MG tablet    Sig: Take 1 tablet (10 mg total) by mouth daily.    Dispense:  90 tablet    Refill:  1      Follow-up: No follow-ups on file.  Anitra Barn, MD

## 2024-03-26 NOTE — Assessment & Plan Note (Signed)
 BP is controlled Norvasc  was renewed Check CMET

## 2024-03-26 NOTE — Assessment & Plan Note (Signed)
On Levothyroxine Check TSH 

## 2024-03-26 NOTE — Assessment & Plan Note (Signed)
 Albuterol MDI prn

## 2024-03-26 NOTE — Assessment & Plan Note (Signed)
 Check GFR Hydrate better

## 2024-03-28 ENCOUNTER — Other Ambulatory Visit: Payer: Self-pay | Admitting: Internal Medicine

## 2024-03-28 DIAGNOSIS — E890 Postprocedural hypothyroidism: Secondary | ICD-10-CM

## 2024-03-28 DIAGNOSIS — N1831 Chronic kidney disease, stage 3a: Secondary | ICD-10-CM

## 2024-04-01 ENCOUNTER — Ambulatory Visit: Payer: Self-pay

## 2024-06-18 ENCOUNTER — Other Ambulatory Visit: Payer: Self-pay | Admitting: Internal Medicine

## 2024-06-18 DIAGNOSIS — I10 Essential (primary) hypertension: Secondary | ICD-10-CM

## 2024-06-22 NOTE — Telephone Encounter (Signed)
 Last OV 03/26/24 Next OV not scheduled  Last refill 03/26/24 Qty #90/1

## 2024-06-26 ENCOUNTER — Other Ambulatory Visit: Payer: Self-pay | Admitting: Internal Medicine

## 2024-06-26 DIAGNOSIS — E890 Postprocedural hypothyroidism: Secondary | ICD-10-CM

## 2024-09-10 ENCOUNTER — Other Ambulatory Visit: Payer: Self-pay | Admitting: Internal Medicine

## 2024-09-10 DIAGNOSIS — E890 Postprocedural hypothyroidism: Secondary | ICD-10-CM

## 2024-09-10 NOTE — Telephone Encounter (Signed)
 Copied from CRM [id number]. Topic: Clinical - Medication Refill >> Sep 10, 2024 11:16 AM [place] J wrote: Medication: levothyroxine  (SYNTHROID ) 25 MCG tablet  Has the patient contacted their pharmacy? Yes (Agent: If no, request that the patient contact the pharmacy for the refill. If patient does not wish to contact the pharmacy document the reason why and proceed with request.) (Agent: If yes, when and what did the pharmacy advise?) The pharmacy did not have available refills.  This is the patient's preferred pharmacy:  [id number] [place] - [place], [place] - [address] [place]. AT [facility] [address] [place]. [place] [address] Phone: [phone] Fax: [phone]  Is this the correct pharmacy for this prescription? Yes If no, delete pharmacy and type the correct one.   Has the prescription been filled recently? No  Is the patient out of the medication? No  Has the patient been seen for an appointment in the last year OR does the patient have an upcoming appointment? Yes  Can we respond through MyChart? No Please call: [phone]  Agent: Please be advised that Rx refills may take up to 3 business days. We ask that you follow-up with your pharmacy.

## 2024-09-10 NOTE — Telephone Encounter (Signed)
 Copied from CRM [id number]. Topic: Clinical - Medication Question >> Sep 10, 2024  3:50 PM [name] wrote: Reason for CRM: Daughter Marilyn Hobbs is calling to check on the status of her prescription refill request for her levothyroxine  (SYNTHROID ) 25 MCG tablet  [facility] [address] [place], [place] - [address] AT [facility] [address] [place] [address] Phone: [phone]  Fax: [phone]  Debbie-[phone]

## 2024-09-13 MED ORDER — LEVOTHYROXINE SODIUM 25 MCG PO TABS: ORAL_TABLET | ORAL | 0 refills | Status: AC

## 2024-10-28 ENCOUNTER — Ambulatory Visit (INDEPENDENT_AMBULATORY_CARE_PROVIDER_SITE_OTHER)

## 2024-10-28 VITALS — Ht 64.0 in | Wt 145.0 lb

## 2024-10-28 DIAGNOSIS — Z0000 Encounter for general adult medical examination without abnormal findings: Secondary | ICD-10-CM | POA: Diagnosis not present

## 2024-10-28 NOTE — Patient Instructions (Signed)
 Marilyn Hobbs,  Thank you for taking the time for your Medicare Wellness Visit. I appreciate your continued commitment to your health goals. Please review the care plan we discussed, and feel free to reach out if I can assist you further.  Please note that Annual Wellness Visits do not include a physical exam. Some assessments may be limited, especially if the visit was conducted virtually. If needed, we may recommend an in-person follow-up with your provider.  Ongoing Care Seeing your primary care provider every 3 to 6 months helps us  monitor your health and provide consistent, personalized care.Last office visit on 03/26/2024.  You are due for a Flu vaccine and can get that done at your local pharmacy.  Each day, aim for 6 glasses of water, plenty of protein in your diet and try to get up and walk/ stretch every hour for 5-10 minutes at a time.    Referrals If a referral was made during today's visit and you haven't received any updates within two weeks, please contact the referred provider directly to check on the status.  Recommended Screenings:  Health Maintenance  Topic Date Due   DTaP/Tdap/Td vaccine (1 - Tdap) Never done   Pneumococcal Vaccine for age over 60 (1 of 2 - PCV) Never done   Zoster (Shingles) Vaccine (1 of 2) Never done   Osteoporosis screening with Bone Density Scan  Never done   Flu Shot  06/18/2024   COVID-19 Vaccine (4 - 2025-26 season) 07/19/2024   Medicare Annual Wellness Visit  10/28/2025   Meningitis B Vaccine  Aged Out       10/28/2024    3:58 PM  Advanced Directives  Does Patient Have a Medical Advance Directive? Yes  Type of Advance Directive Healthcare Power of Attorney  Copy of Healthcare Power of Attorney in Chart? No - copy requested    Vision: Annual vision screenings are recommended for early detection of glaucoma, cataracts, and diabetic retinopathy. These exams can also reveal signs of chronic conditions such as diabetes and high blood  pressure.  Dental: Annual dental screenings help detect early signs of oral cancer, gum disease, and other conditions linked to overall health, including heart disease and diabetes.  Please see the attached documents for additional preventive care recommendations.

## 2024-10-28 NOTE — Progress Notes (Signed)
 Chief Complaint  Patient presents with   Medicare Wellness     Subjective:   Marilyn Hobbs is a 89 y.o. female who presents for a Medicare Annual Wellness Visit.  Visit info / Clinical Intake: Medicare Wellness Visit Type:: Subsequent Annual Wellness Visit Persons participating in visit and providing information:: caregiver (with patient present) (daughter (POA)) Medicare Wellness Visit Mode:: Telephone If telephone:: video declined Since this visit was completed virtually, some vitals may be partially provided or unavailable. Missing vitals are due to the limitations of the virtual format.: Unable to obtain vitals - no equipment If Telephone or Video please confirm:: I connected with patient using audio/video enable telemedicine. I verified patient identity with two identifiers, discussed telehealth limitations, and patient agreed to proceed. Patient Location:: Home Provider Location:: Office Interpreter Needed?: No Pre-visit prep was completed: yes AWV questionnaire completed by patient prior to visit?: no Living arrangements:: with family/others (lives with daughter) Patient's Overall Health Status Rating: very good  Dietary Habits and Nutritional Risks How many meals a day?: 3 Eats fruit and vegetables daily?: yes Most meals are obtained by: preparing own meals (daughter prepares meals) In the last 2 weeks, have you had any of the following?: (!) nausea, vomiting, diarrhea (nausea) Diabetic:: no  Functional Status Activities of Daily Living (to include ambulation/medication): Independent Ambulation: Independent with device- listed below Home Assistive Devices/Equipment: Walker (specify Type) Medication Administration: Independent (daughter uses a pill box) Home Management (perform basic housework or laundry): Needs assistance (comment) Manage your own finances?: (!) no (daughter) Primary transportation is: family / friends (daugter drives her around) Concerns about  vision?: no *vision screening is required for WTM* Concerns about hearing?: (!) yes Uses hearing aids?: (!) yes Hear whispered voice?: (!) no *in-person visit only*  Fall Screening Falls in the past year?: 1 Number of falls in past year: 1 Was there an injury with Fall?: 0 Fall Risk Category Calculator: 2 Patient Fall Risk Level: Moderate Fall Risk  Fall Risk Patient at Risk for Falls Due to: Impaired balance/gait Fall risk Follow up: Falls evaluation completed; Falls prevention discussed  Home and Transportation Safety: All rugs have non-skid backing?: N/A, no rugs All stairs or steps have railings?: yes (first floor living) Grab bars in the bathtub or shower?: yes (shower bench and has a chair) Have non-skid surface in bathtub or shower?: yes Good home lighting?: yes Regular seat belt use?: yes Hospital stays in the last year:: no  Cognitive Assessment Difficulty concentrating, remembering, or making decisions? : no (remembering) Will 6CIT or Mini Cog be Completed: yes What year is it?: 0 points What month is it?: 0 points Give patient an address phrase to remember (5 components): [address], [place] About what time is it?: 0 points  Advance Directives (For Healthcare) Does Patient Have a Medical Advance Directive?: Yes Type of Advance Directive: Healthcare Power of Attorney Copy of Healthcare Power of Attorney in Chart?: No - copy requested  Reviewed/Updated  Reviewed/Updated: Reviewed All (Medical, Surgical, Family, Medications, Allergies, Care Teams, Patient Goals)    Allergies (verified) Patient has no known allergies.   Current Medications (verified) Outpatient Encounter Medications as of 10/28/2024  Medication Sig   albuterol  (VENTOLIN  HFA) 108 (90 Base) MCG/ACT inhaler Inhale 2 puffs into the lungs every 6 (six) hours as needed for wheezing or shortness of breath.   ALPRAZolam  (XANAX ) 0.25 MG tablet TAKE ONE TABLET BY MOUTH ONE TIME DAILY AS NEEDED ANXIETY    amLODipine  (NORVASC ) 5 MG tablet  TAKE ONE TABLET BY MOUTH ONE TIME DAILY   aspirin  EC 81 MG tablet Take 81 mg by mouth daily. Swallow whole.   carboxymethylcellulose (REFRESH PLUS) 0.5 % SOLN Place 1 drop into both eyes 3 (three) times daily as needed (dry eyes).   levothyroxine  (SYNTHROID ) 25 MCG tablet TAKE ONE TABLET BY MOUTH ONE TIME DAILY BEFORE BREAKFAST. **NEEDS FOLLOW UP WITH DR.JONES FOR REFILLS. NO MORE REFILLS UNTIL SEEN   Multiple Vitamin (MULTIVITAMIN) capsule Take 1 capsule by mouth daily.   rosuvastatin  (CRESTOR ) 10 MG tablet Take 1 tablet (10 mg total) by mouth daily.   Fluticasone -Umeclidin-Vilant (TRELEGY ELLIPTA ) 200-62.5-25 MCG/ACT AEPB Inhale 1 puff into the lungs daily. (Patient not taking: Reported on 10/28/2024)   No facility-administered encounter medications on file as of 10/28/2024.    History: Past Medical History:  Diagnosis Date   COPD (chronic obstructive pulmonary disease) (HCC)    High cholesterol    Hypertension    Hypothyroidism    Polycythemia    Past Surgical History:  Procedure Laterality Date   BACK SURGERY     THYROIDECTOMY     Family History  Problem Relation Age of Onset   Breast cancer Mother    Social History   Occupational History   Occupation: Retired  Tobacco Use   Smoking status: Never    Passive exposure: Past   Smokeless tobacco: Never  Vaping Use   Vaping status: Never Used  Substance and Sexual Activity   Alcohol use: Yes    Alcohol/week: 1.0 standard drink of alcohol    Types: 1 Glasses of wine per week    Comment: daily   Drug use: Never   Sexual activity: Not on file   Tobacco Counseling Counseling given: Not Answered  SDOH Screenings   Food Insecurity: No Food Insecurity (10/28/2024)  Housing: Unknown (10/28/2024)  Transportation Needs: No Transportation Needs (10/28/2024)  Utilities: Not At Risk (10/28/2024)  Depression (PHQ2-9): Low Risk (10/28/2024)  Physical Activity: Inactive (10/28/2024)  Social  Connections: Socially Isolated (10/28/2024)  Stress: No Stress Concern Present (10/28/2024)  Tobacco Use: Low Risk (10/28/2024)  Health Literacy: Inadequate Health Literacy (10/28/2024)   See flowsheets for full screening details  Depression Screen PHQ 2 & 9 Depression Scale- Over the past 2 weeks, how often have you been bothered by any of the following problems? Little interest or pleasure in doing things: 0 Feeling down, depressed, or hopeless (PHQ Adolescent also includes...irritable): 1 PHQ-2 Total Score: 1 Trouble falling or staying asleep, or sleeping too much: 1 (falling asleep) Feeling tired or having little energy: 0 Poor appetite or overeating (PHQ Adolescent also includes...weight loss): 0 Feeling bad about yourself - or that you are a failure or have let yourself or your family down: 0 Moving or speaking so slowly that other people could have noticed. Or the opposite - being so fidgety or restless that you have been moving around a lot more than usual: 0 Thoughts that you would be better off dead, or of hurting yourself in some way: 0 PHQ-9 Total Score: 2 If you checked off any problems, how difficult have these problems made it for you to do your work, take care of things at home, or get along with other people?: Not difficult at all  Depression Treatment Depression Interventions/Treatment : Referral to Psychiatry     Goals Addressed   None          Objective:    Today's Vitals   10/28/24 1550  Weight: 145 lb (65.8  kg)  Height: 5' 4 (1.626 m)   Body mass index is 24.89 kg/m.  Hearing/Vision screen Hearing Screening - Comments:: Wears hearing aides Vision Screening - Comments:: Wears eyeglasses/Not UTD/no provider Immunizations and Health Maintenance Health Maintenance  Topic Date Due   DTaP/Tdap/Td (1 - Tdap) Never done   Pneumococcal Vaccine: 50+ Years (1 of 2 - PCV) Never done   Zoster Vaccines- Shingrix (1 of 2) Never done   Bone Density Scan   Never done   Influenza Vaccine  06/18/2024   COVID-19 Vaccine (4 - 2025-26 season) 07/19/2024   Medicare Annual Wellness (AWV)  10/28/2025   Meningococcal B Vaccine  Aged Out        Assessment/Plan:  This is a routine wellness examination for [facility].  Patient Care Team: Joshua Debby CROME, MD as PCP - General (Internal Medicine)  I have personally reviewed and noted the following in the patients chart:   Medical and social history Use of alcohol, tobacco or illicit drugs  Current medications and supplements including opioid prescriptions. Functional ability and status Nutritional status Physical activity Advanced directives List of other physicians Hospitalizations, surgeries, and ER visits in previous 12 months Vitals Screenings to include cognitive, depression, and falls Referrals and appointments  No orders of the defined types were placed in this encounter.  In addition, I have reviewed and discussed with patient certain preventive protocols, quality metrics, and best practice recommendations. A written personalized care plan for preventive services as well as general preventive health recommendations were provided to patient.   Rionna Feltes L Ivanka Kirshner, CMA   10/28/2024   Return in 1 year (on 10/28/2025).  After Visit Summary: (Mail) Due to this being a telephonic visit, the after visit summary with patients personalized plan was offered to patient via mail   Nurse Notes: Patient is due for a flu vaccine.  Patient's daughter stated that patient tells her that she feels light headed and SOB at times.  Daughter explains that patient feels like that after maybe indulging in sweets.  Also, patient does not take her medication Trelegy as well.  She had no other concerns to address today.  Daughter stated that she will call the office to schedule patient as needed.

## 2024-10-30 ENCOUNTER — Other Ambulatory Visit: Payer: Self-pay | Admitting: Internal Medicine

## 2024-10-30 DIAGNOSIS — E785 Hyperlipidemia, unspecified: Secondary | ICD-10-CM

## 2024-12-14 ENCOUNTER — Other Ambulatory Visit: Payer: Self-pay | Admitting: Internal Medicine

## 2024-12-14 DIAGNOSIS — I10 Essential (primary) hypertension: Secondary | ICD-10-CM
